# Patient Record
Sex: Female | Born: 1960 | Race: White | Hispanic: No | Marital: Married | Smoking: Never smoker
Health system: Southern US, Community
[De-identification: ages and names within clinical notes are randomized; demographics above are authoritative.]

## PROBLEM LIST (undated history)

## (undated) DIAGNOSIS — Z860100 Personal history of colon polyps, unspecified: Secondary | ICD-10-CM

## (undated) DIAGNOSIS — Z923 Personal history of irradiation: Secondary | ICD-10-CM

## (undated) DIAGNOSIS — F419 Anxiety disorder, unspecified: Secondary | ICD-10-CM

## (undated) DIAGNOSIS — E039 Hypothyroidism, unspecified: Secondary | ICD-10-CM

## (undated) DIAGNOSIS — H269 Unspecified cataract: Secondary | ICD-10-CM

## (undated) DIAGNOSIS — Z8619 Personal history of other infectious and parasitic diseases: Secondary | ICD-10-CM

## (undated) DIAGNOSIS — C50919 Malignant neoplasm of unspecified site of unspecified female breast: Secondary | ICD-10-CM

## (undated) HISTORY — DX: Personal history of other infectious and parasitic diseases: Z86.19

## (undated) HISTORY — DX: Malignant neoplasm of unspecified site of unspecified female breast: C50.919

## (undated) HISTORY — PX: BREAST BIOPSY: SHX20

## (undated) HISTORY — DX: Personal history of colonic polyps: Z86.010

## (undated) HISTORY — DX: Unspecified cataract: H26.9

## (undated) HISTORY — DX: Personal history of colon polyps, unspecified: Z86.0100

## (undated) HISTORY — DX: Hypothyroidism, unspecified: E03.9

## (undated) HISTORY — DX: Anxiety disorder, unspecified: F41.9

---

## 1996-11-25 HISTORY — PX: BREAST EXCISIONAL BIOPSY: SUR124

## 1996-11-25 HISTORY — PX: BREAST SURGERY: SHX581

## 2006-01-21 ENCOUNTER — Encounter: Admission: RE | Admit: 2006-01-21 | Discharge: 2006-01-21 | Payer: Self-pay | Admitting: Surgical Oncology

## 2007-12-16 ENCOUNTER — Encounter: Admission: RE | Admit: 2007-12-16 | Discharge: 2007-12-16 | Payer: Self-pay | Admitting: General Practice

## 2009-09-26 ENCOUNTER — Encounter: Admission: RE | Admit: 2009-09-26 | Discharge: 2009-09-26 | Payer: Self-pay | Admitting: Obstetrics and Gynecology

## 2010-05-21 LAB — HM COLONOSCOPY

## 2012-01-20 ENCOUNTER — Ambulatory Visit (INDEPENDENT_AMBULATORY_CARE_PROVIDER_SITE_OTHER): Payer: BC Managed Care – PPO | Admitting: Pulmonary Disease

## 2012-01-20 ENCOUNTER — Encounter: Payer: Self-pay | Admitting: Pulmonary Disease

## 2012-01-20 DIAGNOSIS — G479 Sleep disorder, unspecified: Secondary | ICD-10-CM | POA: Insufficient documentation

## 2012-01-20 DIAGNOSIS — R0609 Other forms of dyspnea: Secondary | ICD-10-CM

## 2012-01-20 DIAGNOSIS — R0683 Snoring: Secondary | ICD-10-CM

## 2012-01-20 DIAGNOSIS — G4700 Insomnia, unspecified: Secondary | ICD-10-CM

## 2012-01-20 DIAGNOSIS — R0989 Other specified symptoms and signs involving the circulatory and respiratory systems: Secondary | ICD-10-CM

## 2012-01-20 MED ORDER — ZOLPIDEM TARTRATE 3.5 MG SL SUBL: 3.5000 mg | SUBLINGUAL_TABLET | Freq: Every evening | SUBLINGUAL | Status: DC | PRN

## 2012-01-20 NOTE — Progress Notes (Signed)
  Subjective:    Patient ID: Jeanne Parker, female    DOB: 1961/10/30, 51 y.o.   MRN: [id number]  HPI 50/F, for evaluation of difficulty in maintaining sleep. ESS 1/24 She has been told about snoring in recent months.  Bedtime is 10 pm, sleep latency < 30 mins, sleeps on her side x 1 pillow. She has 1-2 nocturnal awakenings , including one around 3 am , then she is not able to fall asleep again & wakes up exhausted at 0645 . She was given valium a year ago & used this for a while with good results. 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  She has gained 7 lbs over the last year. She reports a lot of movements during sleep, bed is in a mess on awakening, bu denies leg cramps or symptoms s/o restless legs. She had recent blood work which showed that she Was not menopausal yet. Denies hot flashes, she has tried OTC sleep aids including melatonin without relief.    Review of Systems  Constitutional: Negative for fever and unexpected weight change.  HENT: Positive for congestion and sneezing. Negative for ear pain, nosebleeds, sore throat, rhinorrhea, trouble swallowing, dental problem, postnasal drip and sinus pressure.   Eyes: Negative for redness and itching.  Respiratory: Negative for cough, chest tightness, shortness of breath and wheezing.   Cardiovascular: Negative for palpitations and leg swelling.  Gastrointestinal: Negative for nausea and vomiting.       Acid heartburn  Genitourinary: Negative for dysuria.  Musculoskeletal: Negative for joint swelling.  Skin: Negative for rash.  Neurological: Negative for headaches.  Hematological: Does not bruise/bleed easily.  Psychiatric/Behavioral: Negative for dysphoric mood. The patient is not nervous/anxious.        Objective:   Physical Exam  Gen. Pleasant, well-nourished, in no distress, normal affect ENT - no lesions, no post nasal drip Neck: No JVD, no  thyromegaly, no carotid bruits Lungs: no use of accessory muscles, no dullness to percussion, clear without rales or rhonchi  Cardiovascular: Rhythm regular, heart sounds  normal, no murmurs or gallops, no peripheral edema Abdomen: soft and non-tender, no hepatosplenomegaly, BS normal. Musculoskeletal: No deformities, no cyanosis or clubbing Neuro:  alert, non focal      Assessment & Plan:

## 2012-01-20 NOTE — Patient Instructions (Addendum)
Trial of nasal steroid inhaler - 1 spray each nare at bedtime Home sleep study for obstructive sleep apnea  Trial of Zolpidem Tartrate

## 2012-01-21 NOTE — Assessment & Plan Note (Signed)
In absence of excessive daytime somnolence, narrow pharyngeal exam, witnessed apneas & loud snoring, obstructive sleep apnea is less likely & an overnight polysomnogram will be scheduled as a home study. The pathophysiology of obstructive sleep apnea , it's cardiovascular consequences & modes of treatment including CPAP were discused with the patient in detail & they evidenced understanding. Trial of nasal steroid inhaler  For snoring - 1 spray each nare at bedtime

## 2012-01-21 NOTE — Assessment & Plan Note (Signed)
She would benefit from an extremely short acting agent that can give her an extra 3-4 h of sleep & would not affect her adversely should she use this at 2 am As such, ambien 3.5 mg SL would be a good choice.  Alternatively, rozerem can be used but this does not appear to be on her formulary. Avoid alcohol at bedtime, decrease caffeine intake - other rules of sleep hygiene discussed

## 2012-01-23 ENCOUNTER — Telehealth: Payer: Self-pay | Admitting: Pulmonary Disease

## 2012-01-23 MED ORDER — ZOLPIDEM TARTRATE 5 MG PO TABS: 5.0000 mg | ORAL_TABLET | Freq: Every evening | ORAL | Status: DC | PRN

## 2012-01-23 NOTE — Telephone Encounter (Signed)
Spoke with pharmacist- he states that the rozerem and sonata are not covered- therefore called in rx for ambien 5 mg # 30 tablets 1 qhs prn no refills.

## 2012-01-23 NOTE — Telephone Encounter (Signed)
Pl ask them for options - Is sonata or rozerem covered? If not, OK to Rx ambien 5 mg qhs prn x 30

## 2012-01-23 NOTE — Telephone Encounter (Signed)
Spoke with Tresa Endo at [facility] .  Rx for Ambien 3.5 SL 15 tablets cost pt $131.99.  This is a new dosage for this drug and is why it is so expensive.  Pharmacy was not aware of similar med that would be covered by pt's insurance.  Spoke with pt regarding price of medication.  Pt wants to go ahead and try medication and if she decides she wants a substitute she will contact insurance to find out what is covered on her formulary.  Pt will call back with update.

## 2012-01-23 NOTE — Telephone Encounter (Signed)
[facility] [address] SENT FAX OVER STATED THAT  ZOLPIDEM TARTRATE 3.5 IS NOT COVERED PT WANT TO CHANGE TO SOMETHING CHEAPER   AND WILL NEED A NEW RX. [PLEASE ADVISE. THANK YOU .

## 2012-01-27 ENCOUNTER — Telehealth: Payer: Self-pay | Admitting: Pulmonary Disease

## 2012-01-27 ENCOUNTER — Other Ambulatory Visit: Payer: Self-pay | Admitting: *Deleted

## 2012-01-27 DIAGNOSIS — R0683 Snoring: Secondary | ICD-10-CM

## 2012-01-27 DIAGNOSIS — G4700 Insomnia, unspecified: Secondary | ICD-10-CM

## 2012-01-27 MED ORDER — MOMETASONE FUROATE 50 MCG/ACT NA SUSP: 1.0000 | Freq: Every day | NASAL | Status: DC

## 2012-01-27 NOTE — Telephone Encounter (Signed)
Yes- 1 spray each nare please

## 2012-01-27 NOTE — Telephone Encounter (Signed)
Pt is aware of directions for Nasonex use and this has been sent electronically to [facility] in [place].

## 2012-01-27 NOTE — Telephone Encounter (Signed)
Spoke to pt and she states meds are working fine at this time, she did request rx for the nasonex because she was only given a sample--pls advise if this was to continue after sample was gone

## 2012-02-25 ENCOUNTER — Ambulatory Visit (HOSPITAL_BASED_OUTPATIENT_CLINIC_OR_DEPARTMENT_OTHER): Payer: BC Managed Care – PPO | Attending: Pulmonary Disease | Admitting: Radiology

## 2012-02-25 VITALS — Ht 65.0 in | Wt 135.0 lb

## 2012-02-25 DIAGNOSIS — E039 Hypothyroidism, unspecified: Secondary | ICD-10-CM | POA: Insufficient documentation

## 2012-02-25 DIAGNOSIS — R0609 Other forms of dyspnea: Secondary | ICD-10-CM | POA: Insufficient documentation

## 2012-02-25 DIAGNOSIS — R0683 Snoring: Secondary | ICD-10-CM

## 2012-02-25 DIAGNOSIS — R259 Unspecified abnormal involuntary movements: Secondary | ICD-10-CM | POA: Insufficient documentation

## 2012-02-25 DIAGNOSIS — R5381 Other malaise: Secondary | ICD-10-CM | POA: Insufficient documentation

## 2012-02-25 DIAGNOSIS — R5383 Other fatigue: Secondary | ICD-10-CM | POA: Insufficient documentation

## 2012-02-25 DIAGNOSIS — G4700 Insomnia, unspecified: Secondary | ICD-10-CM

## 2012-02-25 DIAGNOSIS — Z79899 Other long term (current) drug therapy: Secondary | ICD-10-CM | POA: Insufficient documentation

## 2012-02-25 DIAGNOSIS — R0989 Other specified symptoms and signs involving the circulatory and respiratory systems: Secondary | ICD-10-CM | POA: Insufficient documentation

## 2012-02-25 DIAGNOSIS — G4733 Obstructive sleep apnea (adult) (pediatric): Secondary | ICD-10-CM

## 2012-02-26 ENCOUNTER — Telehealth: Payer: Self-pay | Admitting: Pulmonary Disease

## 2012-02-26 DIAGNOSIS — G4700 Insomnia, unspecified: Secondary | ICD-10-CM

## 2012-02-26 DIAGNOSIS — R0989 Other specified symptoms and signs involving the circulatory and respiratory systems: Secondary | ICD-10-CM

## 2012-02-26 DIAGNOSIS — R0609 Other forms of dyspnea: Secondary | ICD-10-CM

## 2012-02-26 NOTE — Telephone Encounter (Signed)
PSG showed snoring & mild increased resistance in upper airway. Not significant to require treatment Needs FU visit in 1-2 months

## 2012-02-27 NOTE — Telephone Encounter (Signed)
/  I spoke with patient about results and she verbalized understanding and had no questions. Pt is scheduled to come in 04/10/12 at 3:30

## 2012-02-27 NOTE — Procedures (Signed)
[name], PICKING NO.:  [id number]  MEDICAL RECORD NO.:  [id number]          PATIENT TYPE:  OUT  LOCATION:  SLEEP CENTER                 FACILITY:  [facility]  PHYSICIAN:  Oretha Milch, MD      DATE OF BIRTH:  [birth year]  DATE OF STUDY:  02/25/2012                           NOCTURNAL POLYSOMNOGRAM  REFERRING PHYSICIAN:  Oretha Milch, MD  INDICATION FOR STUDY:  Excessive daytime fatigue, loud and new onset snoring and restless and nonrefreshing sleep in this 51 year old woman with hypothyroidism.  At the time of this study, she weighed 135 pounds with a height of 5 feet 5 inches, BMI of 22 neck size of 11.5 inch.  EPWORTH SLEEPINESS SCORE:  3.  BEDTIME MEDICATIONS:  Included zolpidem at 10:30 p.m.  This nocturnal polysomnogram was performed with a sleep technologist in attendance.  EEG, EOG, EMG, EKG, and respiratory parameters were recorded.  Sleep stages, arousals, limb movements, and respiratory data were scored according to criteria laid out by the American Academy of Sleep Medicine.  SLEEP ARCHITECTURE:  Lights out was at 10:54 p.m., lights on was at 4:52 a.m.  Total sleep time was 320 minutes with a sleep period time of 338 minutes and a sleep efficiency of 89%.  Sleep latency was 90 minutes and latency to REM sleep was 245 minutes.  REM sleep was noted in one stage around 3:30 a.m.  Sleep stages of the percentage of total sleep time was N1 4%, N2 86%, N3 1%, and REM sleep 9% (29 minutes). Supine sleep accounted for 14 minutes.  Supine REM sleep was not noted.  AROUSAL DATA:  There were 103 arousals with an arousal index of 19 events per hour.  Of these, 99 were spontaneous, and 4 were associated with limb movements.  RESPIRATORY DATA:  There were 0 obstructive apneas, 0 central apneas, 0 mixed apneas and 0 hypopneas with an apnea-hypopnea index of 0 events per hour,  30 RERAs were noted with an RDI of 6 events per hour.  OXYGEN SATURATION DATA:   The desaturation index was 0.3 events per hour. She spent 0 minutes with a saturation less than 88%.  Lowest desaturation was 84%.  LIMB MOVEMENT DATA:  The limb movement index was 10.5 events per hour. The limb movement arousal index was only 0.8 events per hour.  CARDIAC DATA:  The low heart rate was 37 beats per minute.  The high heart rate was 97 beats per minute.  No arrhythmias were noted.  DISCUSSION:  She was desensitized with a small [place] full face mask but did not have enough respiratory events to warrant CPAP trial.  IMPRESSION: 1. Mild increased upper airway resistance  and soft snoring. 2. Few limb movements, however, most of these were not associated with     arousals 3. No evidence of cardiac arrhythmias, or behavioral disturbance     during sleep.  RECOMMENDATION:  No treatment is warranted for this mild degree of sleep- disordered breathing.  If snoring remains an issue, an oral appliance can be tried.  Sleep hygiene and cognitive behavioral therapy for insomnia can be attempted.     Oretha Milch, MD  RVA/MEDQ  D:  02/26/2012 11:42:45  T:  02/27/2012 02:10:09  Job:  [id number]

## 2012-03-05 ENCOUNTER — Ambulatory Visit: Payer: BC Managed Care – PPO | Admitting: Pulmonary Disease

## 2012-03-13 ENCOUNTER — Telehealth: Payer: Self-pay | Admitting: Pulmonary Disease

## 2012-03-13 MED ORDER — ZOLPIDEM TARTRATE 5 MG PO TABS: 5.0000 mg | ORAL_TABLET | Freq: Every evening | ORAL | Status: DC | PRN

## 2012-03-13 NOTE — Telephone Encounter (Signed)
ok 

## 2012-03-13 NOTE — Telephone Encounter (Signed)
Rx was refilled

## 2012-03-13 NOTE — Telephone Encounter (Signed)
[facility] [address] ZOLPIDEM TARTRATE 5MG  <> TAKE 1 TABLET AT BEDTIME AS NEEDED FOR SLEEP  #30 No Known Allergies Dr Vassie Loll is this ok to fill . Thank you

## 2012-04-10 ENCOUNTER — Ambulatory Visit: Payer: BC Managed Care – PPO | Admitting: Pulmonary Disease

## 2012-04-27 ENCOUNTER — Telehealth: Payer: Self-pay | Admitting: Pulmonary Disease

## 2012-04-27 MED ORDER — ZOLPIDEM TARTRATE 5 MG PO TABS: 5.0000 mg | ORAL_TABLET | Freq: Every evening | ORAL | Status: DC | PRN

## 2012-04-27 NOTE — Telephone Encounter (Signed)
Refill sent, pt is aware. Icelyn Navarrete, CMA  

## 2012-05-29 ENCOUNTER — Ambulatory Visit: Payer: BC Managed Care – PPO | Admitting: Pulmonary Disease

## 2012-06-11 ENCOUNTER — Telehealth: Payer: Self-pay | Admitting: Pulmonary Disease

## 2012-06-11 NOTE — Telephone Encounter (Signed)
Error

## 2012-06-12 ENCOUNTER — Encounter: Payer: Self-pay | Admitting: Pulmonary Disease

## 2012-06-12 ENCOUNTER — Ambulatory Visit (INDEPENDENT_AMBULATORY_CARE_PROVIDER_SITE_OTHER): Payer: BC Managed Care – PPO | Admitting: Pulmonary Disease

## 2012-06-12 VITALS — BP 122/80 | HR 71 | Temp 98.2°F | Ht 65.0 in | Wt 141.2 lb

## 2012-06-12 DIAGNOSIS — R0683 Snoring: Secondary | ICD-10-CM

## 2012-06-12 DIAGNOSIS — R0989 Other specified symptoms and signs involving the circulatory and respiratory systems: Secondary | ICD-10-CM

## 2012-06-12 DIAGNOSIS — R0609 Other forms of dyspnea: Secondary | ICD-10-CM

## 2012-06-12 DIAGNOSIS — Z0000 Encounter for general adult medical examination without abnormal findings: Secondary | ICD-10-CM

## 2012-06-12 DIAGNOSIS — G4700 Insomnia, unspecified: Secondary | ICD-10-CM

## 2012-06-12 MED ORDER — ZOLPIDEM TARTRATE 5 MG PO TABS: 5.0000 mg | ORAL_TABLET | Freq: Every evening | ORAL | Status: DC | PRN

## 2012-06-12 NOTE — Assessment & Plan Note (Signed)
Ct ambien Discussed CBT Int medicine referral

## 2012-06-12 NOTE — Patient Instructions (Addendum)
Ambien 5mg  x 30 pills with 5 refills INternal medicine referral

## 2012-06-12 NOTE — Progress Notes (Signed)
  Subjective:    Patient ID: Jeanne Parker, female    DOB: 12/04/1960, 51 y.o.   MRN: [id number]  HPI 50/F, for FU of insomnia ESS 1/24  She has been told about snoring in recent months.  Bedtime is 10 pm, sleep latency < 30 mins, sleeps on her side x 1 pillow. She has 1-2 nocturnal awakenings , including one around 3 am , then she is not able to fall asleep again & wakes up exhausted at 0645 . She was given valium a year ago & used this for a while with good results. 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  She has gained 7 lbs over the last year. She reports a lot of movements during sleep, bed is in a mess on awakening, bu denies leg cramps or symptoms s/o restless legs.  She had recent blood work which showed that she Was not menopausal yet. Denies hot flashes, she has tried OTC sleep aids including melatonin without relief. >> nasonex, ambien 5mg  , PSG 4/13showed snoring & mild increased resistance in upper airway.  Not significant to require treatment Ambien 5 mg helps  - she is careful not to take it late at night, no driving issues. She has changed jobs   Review of Systems Patient denies significant dyspnea,cough, hemoptysis,  chest pain, palpitations, pedal edema, orthopnea, paroxysmal nocturnal dyspnea, lightheadedness, nausea, vomiting, abdominal or  leg pains      Objective:   Physical Exam  Gen. Pleasant, well-nourished, in no distress ENT - no lesions, no post nasal drip Neck: No JVD, no thyromegaly, no carotid bruits Lungs: no use of accessory muscles, no dullness to percussion, clear without rales or rhonchi  Cardiovascular: Rhythm regular, heart sounds  normal, no murmurs or gallops, no peripheral edema Musculoskeletal: No deformities, no cyanosis or clubbing        Assessment & Plan:

## 2012-06-16 NOTE — Assessment & Plan Note (Signed)
nasonex at bedtime.

## 2012-06-17 ENCOUNTER — Ambulatory Visit: Payer: BC Managed Care – PPO | Admitting: Family Medicine

## 2013-01-04 ENCOUNTER — Ambulatory Visit (INDEPENDENT_AMBULATORY_CARE_PROVIDER_SITE_OTHER): Payer: BC Managed Care – PPO | Admitting: Internal Medicine

## 2013-01-04 ENCOUNTER — Other Ambulatory Visit (INDEPENDENT_AMBULATORY_CARE_PROVIDER_SITE_OTHER): Payer: BC Managed Care – PPO

## 2013-01-04 ENCOUNTER — Encounter: Payer: Self-pay | Admitting: Internal Medicine

## 2013-01-04 VITALS — BP 130/82 | HR 70 | Temp 98.9°F | Ht 65.0 in | Wt 140.2 lb

## 2013-01-04 DIAGNOSIS — Z0000 Encounter for general adult medical examination without abnormal findings: Secondary | ICD-10-CM

## 2013-01-04 DIAGNOSIS — E039 Hypothyroidism, unspecified: Secondary | ICD-10-CM

## 2013-01-04 DIAGNOSIS — F411 Generalized anxiety disorder: Secondary | ICD-10-CM

## 2013-01-04 DIAGNOSIS — G4700 Insomnia, unspecified: Secondary | ICD-10-CM

## 2013-01-04 LAB — URINALYSIS, ROUTINE W REFLEX MICROSCOPIC
Bilirubin Urine: NEGATIVE
Hgb urine dipstick: NEGATIVE
Ketones, ur: NEGATIVE
Leukocytes, UA: NEGATIVE
Nitrite: NEGATIVE
Specific Gravity, Urine: <=1.005 (ref 1.000–1.030)
Total Protein, Urine: NEGATIVE
Urine Glucose: NEGATIVE
Urobilinogen, UA: 0.2 (ref 0.0–1.0)
pH: 5.5 (ref 5.0–8.0)

## 2013-01-04 LAB — CBC WITH DIFFERENTIAL/PLATELET
Basophils Absolute: 0 10*3/uL (ref 0.0–0.1)
Basophils Relative: 0.4 % (ref 0.0–3.0)
Eosinophils Absolute: 0.1 10*3/uL (ref 0.0–0.7)
Eosinophils Relative: 1.9 % (ref 0.0–5.0)
HCT: 37.5 % (ref 36.0–46.0)
Hemoglobin: 12.7 g/dL (ref 12.0–15.0)
Lymphocytes Relative: 28.1 % (ref 12.0–46.0)
Lymphs Abs: 1 10*3/uL (ref 0.7–4.0)
MCHC: 33.9 g/dL (ref 30.0–36.0)
MCV: 93 fl (ref 78.0–100.0)
Monocytes Absolute: 0.3 10*3/uL (ref 0.1–1.0)
Monocytes Relative: 9 % (ref 3.0–12.0)
Neutro Abs: 2.2 10*3/uL (ref 1.4–7.7)
Neutrophils Relative %: 60.6 % (ref 43.0–77.0)
Platelets: 310 10*3/uL (ref 150.0–400.0)
RBC: 4.03 Mil/uL (ref 3.87–5.11)
RDW: 13.3 % (ref 11.5–14.6)
WBC: 3.7 10*3/uL — ABNORMAL LOW (ref 4.5–10.5)

## 2013-01-04 LAB — BASIC METABOLIC PANEL
BUN: 11 mg/dL (ref 6–23)
CO2: 26 mEq/L (ref 19–32)
Calcium: 9.1 mg/dL (ref 8.4–10.5)
Chloride: 105 mEq/L (ref 96–112)
Creatinine, Ser: 0.6 mg/dL (ref 0.4–1.2)
GFR: 109.38 mL/min (ref >60.00)
Glucose, Bld: 105 mg/dL — ABNORMAL HIGH (ref 70–99)
Potassium: 4 mEq/L (ref 3.5–5.1)
Sodium: 138 mEq/L (ref 135–145)

## 2013-01-04 LAB — LIPID PANEL
Cholesterol: 251 mg/dL — ABNORMAL HIGH (ref 0–200)
HDL: 58.3 mg/dL (ref >39.00)
Total CHOL/HDL Ratio: 4
Triglycerides: 154 mg/dL — ABNORMAL HIGH (ref 0.0–149.0)
VLDL: 30.8 mg/dL (ref 0.0–40.0)

## 2013-01-04 LAB — HEPATIC FUNCTION PANEL
ALT: 13 U/L (ref 0–35)
AST: 18 U/L (ref 0–37)
Albumin: 4.1 g/dL (ref 3.5–5.2)
Alkaline Phosphatase: 21 U/L — ABNORMAL LOW (ref 39–117)
Bilirubin, Direct: 0.1 mg/dL (ref 0.0–0.3)
Total Bilirubin: 0.7 mg/dL (ref 0.3–1.2)
Total Protein: 7.3 g/dL (ref 6.0–8.3)

## 2013-01-04 LAB — LDL CHOLESTEROL, DIRECT: Direct LDL: 158.7 mg/dL

## 2013-01-04 LAB — TSH: TSH: 1.55 u[IU]/mL (ref 0.35–5.50)

## 2013-01-04 MED ORDER — ZOLPIDEM TARTRATE 5 MG PO TABS: 5.0000 mg | ORAL_TABLET | Freq: Every evening | ORAL | Status: DC | PRN

## 2013-01-04 NOTE — Progress Notes (Signed)
Subjective:    Patient ID: Jeanne Parker, female    DOB: Jan 28, 1961, 52 y.o.   MRN: [id number]  HPI New pt to me and our division, here to establish with PCP - patient is here today for annual physical. Patient feels well overall.  Also reviewed chronic medical issues: Anxiety - largely situational - related to 3 sons with ADHD + dyslexia and subsquent behavior issues - takes citalopram qd as rx'd by gyn in 2010 - hopes to wean off same in future, but feels calm and controlled on meds at this time  Chronic insomnia - see anxiety above- take generic Ambien nightly for same - other sleep hygiene interventions ineffective - OTC therapies ineffective - no other rx med ever used - requests refills  Hypothyroid - the patient reports compliance with medication(s) as prescribed. Denies adverse side effects.  Past Medical History  Diagnosis Date  . Hypothyroid   . History of colon polyps   . History of chicken pox   . Anxiety     situational   Family History  Problem Relation Age of Onset  . Diabetes Father 64  . Hyperlipidemia Father   . Colon cancer Paternal Grandfather     died age 42  . Hypertension Father   . GER disease Father   . Hypertension Mother   . Hyperlipidemia Mother   . Coronary artery disease Father     angioplasty age 44s   History  Substance Use Topics  . Smoking status: Never Smoker   . Smokeless tobacco: Never Used  . Alcohol Use: Yes     Comment: 1 to 2 glasses of wine a week      Review of Systems Constitutional: Negative for fever or weight change. chronic fatigue without change Respiratory: Negative for cough and shortness of breath.   Cardiovascular: Negative for chest pain or palpitations.  Gastrointestinal: Negative for abdominal pain, no bowel changes.  Musculoskeletal: Negative for gait problem or joint swelling.  Skin: Negative for rash.  Neurological: Negative for dizziness or headache.  No other specific complaints in a complete  review of systems (except as listed in HPI above).     Objective:   Physical Exam BP 130/82  Pulse 70  Temp(Src) 98.9 F (37.2 C) (Oral)  Ht 5\' 5"  (1.651 m)  Wt 140 lb 3.2 oz (63.594 kg)  BMI 23.33 kg/m2  SpO2 96% Wt Readings from Last 3 Encounters:  01/04/13 140 lb 3.2 oz (63.594 kg)  06/12/12 141 lb 3.2 oz (64.048 kg)  02/25/12 135 lb (61.236 kg)   Constitutional: She appears well-developed and well-nourished. No distress.  HENT: Head: Normocephalic and atraumatic. Ears: B TMs ok, no erythema or effusion; Nose: Nose normal. Mouth/Throat: Oropharynx is clear and moist. No oropharyngeal exudate.  Eyes: Conjunctivae and EOM are normal. Pupils are equal, round, and reactive to light. No scleral icterus.  Neck: Normal range of motion. Neck supple. No JVD present. No thyromegaly present.  Cardiovascular: Normal rate, regular rhythm and normal heart sounds.  No murmur heard. No BLE edema. Pulmonary/Chest: Effort normal and breath sounds normal. No respiratory distress. She has no wheezes.  Abdominal: Soft. Bowel sounds are normal. She exhibits no distension. There is no tenderness. no masses Musculoskeletal: Normal range of motion, no joint effusions. No gross deformities Neurological: She is alert and oriented to person, place, and time. No cranial nerve deficit. Coordination normal.  Skin: Skin is warm and dry. No rash noted. No erythema.  Psychiatric: She has a normal  mood and affect. Her behavior is normal. Judgment and thought content normal.   No results found for this basename: WBC, HGB, HCT, PLT, GLUCOSE, CHOL, TRIG, HDL, LDLDIRECT, LDLCALC, ALT, AST, NA, K, CL, CREATININE, BUN, CO2, TSH, PSA, INR, GLUF, HGBA1C, MICROALBUR       Assessment & Plan:   CPX/v70.0 - Patient has been counseled on age-appropriate routine health concerns for screening and prevention. These are reviewed and up-to-date. Immunizations are up-to-date or declined. Labs ordered and will be reviewed. ROI from  GI requested re: prior colo and follow up plans

## 2013-01-04 NOTE — Assessment & Plan Note (Signed)
On citalopram daily for same since 2011 Reports symptoms largely situational related to 3 sons with ADHD and dyslexia -  Hopes to eventually wean off same Continue counseling and current meds without change - support [provided as hx reviewed in depth today The current medical regimen is effective;  continue present plan and medications.

## 2013-01-04 NOTE — Patient Instructions (Signed)
It was good to see you today. Test(s) ordered today. Your results will be released to MyChart (or called to you) after review, usually within 72hours after test completion. If any changes need to be made, you will be notified at that same time. Health Maintenance reviewed - check on your last Tetanus/Tdap with gynecology, let us know if >10 years since your last booster to update same - all other recommended immunizations and age-appropriate screenings are up-to-date. Test(s) ordered today. Your results will be released to MyChart (or called to you) after review, usually within 72hours after test completion. If any changes need to be made, you will be notified at that same time. Medications reviewed, no changes at this time. Refill on medication(s) as discussed today. Please schedule followup in 12 months for annually medical physical and labs, call sooner if problems. Health Maintenance, Females A healthy lifestyle and preventative care can promote health and wellness.  Maintain regular health, dental, and eye exams.  Eat a healthy diet. Foods like vegetables, fruits, whole grains, low-fat dairy products, and lean protein foods contain the nutrients you need without too many calories. Decrease your intake of foods high in solid fats, added sugars, and salt. Get information about a proper diet from your caregiver, if necessary.  Regular physical exercise is one of the most important things you can do for your health. Most adults should get at least 150 minutes of moderate-intensity exercise (any activity that increases your heart rate and causes you to sweat) each week. In addition, most adults need muscle-strengthening exercises on 2 or more days a week.   Maintain a healthy weight. The body mass index (BMI) is a screening tool to identify possible weight problems. It provides an estimate of body fat based on height and weight. Your caregiver can help determine your BMI, and can help you achieve or  maintain a healthy weight. For adults 20 years and older:  A BMI below 18.5 is considered underweight.  A BMI of 18.5 to 24.9 is normal.  A BMI of 25 to 29.9 is considered overweight.  A BMI of 30 and above is considered obese.  Maintain normal blood lipids and cholesterol by exercising and minimizing your intake of saturated fat. Eat a balanced diet with plenty of fruits and vegetables. Blood tests for lipids and cholesterol should begin at age 31 and be repeated every 5 years. If your lipid or cholesterol levels are high, you are over 50, or you are a high risk for heart disease, you may need your cholesterol levels checked more frequently.Ongoing high lipid and cholesterol levels should be treated with medicines if diet and exercise are not effective.  If you smoke, find out from your caregiver how to quit. If you do not use tobacco, do not start.  If you are pregnant, do not drink alcohol. If you are breastfeeding, be very cautious about drinking alcohol. If you are not pregnant and choose to drink alcohol, do not exceed 1 drink per day. One drink is considered to be 12 ounces (355 mL) of beer, 5 ounces (148 mL) of wine, or 1.5 ounces (44 mL) of liquor.  Avoid use of street drugs. Do not share needles with anyone. Ask for help if you need support or instructions about stopping the use of drugs.  High blood pressure causes heart disease and increases the risk of stroke. Blood pressure should be checked at least every 1 to 2 years. Ongoing high blood pressure should be treated with medicines, if  weight loss and exercise are not effective.  If you are 74 to 53 years old, ask your caregiver if you should take aspirin to prevent strokes.  Diabetes screening involves taking a blood sample to check your fasting blood sugar level. This should be done once every 3 years, after age 70, if you are within normal weight and without risk factors for diabetes. Testing should be considered at a younger  age or be carried out more frequently if you are overweight and have at least 1 risk factor for diabetes.  Breast cancer screening is essential preventative care for women. You should practice "breast self-awareness." This means understanding the normal appearance and feel of your breasts and may include breast self-examination. Any changes detected, no matter how small, should be reported to a caregiver. Women in their 68s and 30s should have a clinical breast exam (CBE) by a caregiver as part of a regular health exam every 1 to 3 years. After age 69, women should have a CBE every year. Starting at age 27, women should consider having a mammogram (breast X-ray) every year. Women who have a family history of breast cancer should talk to their caregiver about genetic screening. Women at a high risk of breast cancer should talk to their caregiver about having an MRI and a mammogram every year.  The Pap test is a screening test for cervical cancer. Women should have a Pap test starting at age 57. Between ages 67 and 41, Pap tests should be repeated every 2 years. Beginning at age 68, you should have a Pap test every 3 years as long as the past 3 Pap tests have been normal. If you had a hysterectomy for a problem that was not cancer or a condition that could lead to cancer, then you no longer need Pap tests. If you are between ages 50 and 53, and you have had normal Pap tests going back 10 years, you no longer need Pap tests. If you have had past treatment for cervical cancer or a condition that could lead to cancer, you need Pap tests and screening for cancer for at least 20 years after your treatment. If Pap tests have been discontinued, risk factors (such as a new sexual partner) need to be reassessed to determine if screening should be resumed. Some women have medical problems that increase the chance of getting cervical cancer. In these cases, your caregiver may recommend more frequent screening and Pap  tests.  The human papillomavirus (HPV) test is an additional test that may be used for cervical cancer screening. The HPV test looks for the virus that can cause the cell changes on the cervix. The cells collected during the Pap test can be tested for HPV. The HPV test could be used to screen women aged 30 years and older, and should be used in women of any age who have unclear Pap test results. After the age of 17, women should have HPV testing at the same frequency as a Pap test.  Colorectal cancer can be detected and often prevented. Most routine colorectal cancer screening begins at the age of 100 and continues through age 77. However, your caregiver may recommend screening at an earlier age if you have risk factors for colon cancer. On a yearly basis, your caregiver may provide home test kits to check for hidden blood in the stool. Use of a small camera at the end of a tube, to directly examine the colon (sigmoidoscopy or colonoscopy), can detect the  earliest forms of colorectal cancer. Talk to your caregiver about this at age 24, when routine screening begins. Direct examination of the colon should be repeated every 5 to 10 years through age 58, unless early forms of pre-cancerous polyps or small growths are found.  Hepatitis C blood testing is recommended for all people born from [birth year] through 1965 and any individual with known risks for hepatitis C.  Practice safe sex. Use condoms and avoid high-risk sexual practices to reduce the spread of sexually transmitted infections (STIs). Sexually active women aged 12 and younger should be checked for Chlamydia, which is a common sexually transmitted infection. Older women with new or multiple partners should also be tested for Chlamydia. Testing for other STIs is recommended if you are sexually active and at increased risk.  Osteoporosis is a disease in which the bones lose minerals and strength with aging. This can result in serious bone fractures. The risk  of osteoporosis can be identified using a bone density scan. Women ages 45 and over and women at risk for fractures or osteoporosis should discuss screening with their caregivers. Ask your caregiver whether you should be taking a calcium supplement or vitamin D to reduce the rate of osteoporosis.  Menopause can be associated with physical symptoms and risks. Hormone replacement therapy is available to decrease symptoms and risks. You should talk to your caregiver about whether hormone replacement therapy is right for you.  Use sunscreen with a sun protection factor (SPF) of 30 or greater. Apply sunscreen liberally and repeatedly throughout the day. You should seek shade when your shadow is shorter than you. Protect yourself by wearing long sleeves, pants, a wide-brimmed hat, and sunglasses year round, whenever you are outdoors.  Notify your caregiver of new moles or changes in moles, especially if there is a change in shape or color. Also notify your caregiver if a mole is larger than the size of a pencil eraser.  Stay current with your immunizations. Document Released: 05/27/2011 Document Revised: 02/03/2012 Document Reviewed: 05/27/2011 [facility] Patient Information 2013 [place], [place].

## 2013-01-04 NOTE — Assessment & Plan Note (Signed)
No results found for this basename: TSH   The current medical regimen is effective;  continue present plan and medications. 

## 2013-01-04 NOTE — Assessment & Plan Note (Signed)
Ok to continue Ambien S/p sleep eval 05/2012 for same - reviewed today

## 2013-01-09 ENCOUNTER — Other Ambulatory Visit: Payer: Self-pay | Admitting: Internal Medicine

## 2013-01-11 ENCOUNTER — Encounter: Payer: Self-pay | Admitting: Internal Medicine

## 2013-04-07 ENCOUNTER — Other Ambulatory Visit: Payer: Self-pay | Admitting: Internal Medicine

## 2013-05-21 ENCOUNTER — Other Ambulatory Visit: Payer: Self-pay | Admitting: Pulmonary Disease

## 2013-05-24 ENCOUNTER — Other Ambulatory Visit: Payer: Self-pay | Admitting: Pulmonary Disease

## 2013-05-27 ENCOUNTER — Other Ambulatory Visit: Payer: Self-pay | Admitting: Pulmonary Disease

## 2013-05-28 ENCOUNTER — Other Ambulatory Visit: Payer: Self-pay | Admitting: Pulmonary Disease

## 2013-05-31 ENCOUNTER — Other Ambulatory Visit: Payer: Self-pay | Admitting: Internal Medicine

## 2013-05-31 ENCOUNTER — Telehealth: Payer: Self-pay | Admitting: Pulmonary Disease

## 2013-05-31 NOTE — Telephone Encounter (Signed)
LM with [facility] to let them know that we have not seen in the pt in almost a year. This request needs to be sent to PCP. Nothing further was needed.

## 2013-06-20 ENCOUNTER — Other Ambulatory Visit: Payer: Self-pay | Admitting: Internal Medicine

## 2013-06-29 ENCOUNTER — Other Ambulatory Visit: Payer: Self-pay | Admitting: Internal Medicine

## 2013-07-17 ENCOUNTER — Other Ambulatory Visit: Payer: Self-pay | Admitting: Internal Medicine

## 2013-07-19 ENCOUNTER — Other Ambulatory Visit: Payer: Self-pay | Admitting: Internal Medicine

## 2013-07-19 NOTE — Telephone Encounter (Signed)
[facility].../lmb 

## 2013-07-20 NOTE — Telephone Encounter (Signed)
[facility].../lmb 

## 2013-07-29 ENCOUNTER — Other Ambulatory Visit: Payer: Self-pay | Admitting: Internal Medicine

## 2013-07-30 NOTE — Telephone Encounter (Signed)
[facility]..Marland KitchenLMB

## 2013-10-02 ENCOUNTER — Other Ambulatory Visit: Payer: Self-pay | Admitting: Internal Medicine

## 2013-10-18 ENCOUNTER — Other Ambulatory Visit: Payer: Self-pay | Admitting: Internal Medicine

## 2013-10-18 ENCOUNTER — Telehealth: Payer: Self-pay | Admitting: *Deleted

## 2013-10-18 MED ORDER — ESZOPICLONE 2 MG PO TABS: 2.0000 mg | ORAL_TABLET | Freq: Every evening | ORAL | Status: DC | PRN

## 2013-10-18 NOTE — Telephone Encounter (Signed)
Ok done

## 2013-10-18 NOTE — Telephone Encounter (Signed)
Pt called requesting sleep aide refill.  States she prefers [place].  Please advise

## 2013-10-18 NOTE — Telephone Encounter (Signed)
Spoke with pt advised Rx faxed 

## 2013-11-23 ENCOUNTER — Other Ambulatory Visit: Payer: Self-pay | Admitting: Internal Medicine

## 2013-11-23 NOTE — Telephone Encounter (Signed)
[facility].../lmb 

## 2013-12-30 ENCOUNTER — Other Ambulatory Visit: Payer: Self-pay | Admitting: Internal Medicine

## 2014-01-03 ENCOUNTER — Other Ambulatory Visit: Payer: Self-pay | Admitting: *Deleted

## 2014-01-03 MED ORDER — LEVOTHYROXINE SODIUM 75 MCG PO TABS: ORAL_TABLET | ORAL | Status: DC

## 2014-01-10 ENCOUNTER — Other Ambulatory Visit: Payer: Self-pay | Admitting: Internal Medicine

## 2014-01-12 NOTE — Telephone Encounter (Signed)
Faxed script [facility] to [facility].../lmb 

## 2014-01-14 ENCOUNTER — Other Ambulatory Visit: Payer: Self-pay | Admitting: *Deleted

## 2014-01-14 NOTE — Telephone Encounter (Signed)
Received fax pt needing PA on her Lunesta notifed insurance PA was fax over. Completed Pa and fax back waiting on approval status...Johny Chess

## 2014-01-17 NOTE — Telephone Encounter (Signed)
Received PA med has been approved notified pharmacy spoke with Travis/pharmacist gave approval status...Johny Chess

## 2014-01-28 ENCOUNTER — Other Ambulatory Visit: Payer: Self-pay | Admitting: Internal Medicine

## 2014-04-04 ENCOUNTER — Other Ambulatory Visit: Payer: Self-pay | Admitting: Internal Medicine

## 2014-04-06 ENCOUNTER — Telehealth: Payer: Self-pay | Admitting: *Deleted

## 2014-04-06 MED ORDER — CITALOPRAM HYDROBROMIDE 20 MG PO TABS: 20.0000 mg | ORAL_TABLET | Freq: Every day | ORAL | Status: DC

## 2014-04-06 MED ORDER — LEVOTHYROXINE SODIUM 75 MCG PO TABS: ORAL_TABLET | ORAL | Status: DC

## 2014-04-06 NOTE — Telephone Encounter (Signed)
Pt called and made cpx appt for 06/29/14. Have 1 synthroid left needing refill [facility] to [facility]. Izora Gala inform pt we will send enough synthroid until cpx...Johny Chess

## 2014-04-06 NOTE — Telephone Encounter (Signed)
Called pt to verify what dosage on the synthroid. Pt states she is using 75 mcg. She also need her citalopram. Inform pt will send to [facility]...Jeanne Parker

## 2014-04-25 LAB — HM MAMMOGRAPHY

## 2014-04-25 LAB — HM PAP SMEAR

## 2014-06-29 ENCOUNTER — Other Ambulatory Visit (INDEPENDENT_AMBULATORY_CARE_PROVIDER_SITE_OTHER): Payer: 59

## 2014-06-29 ENCOUNTER — Ambulatory Visit (INDEPENDENT_AMBULATORY_CARE_PROVIDER_SITE_OTHER): Payer: 59 | Admitting: Internal Medicine

## 2014-06-29 ENCOUNTER — Encounter: Payer: Self-pay | Admitting: Internal Medicine

## 2014-06-29 VITALS — BP 122/70 | HR 66 | Temp 98.2°F | Ht 65.0 in | Wt 134.1 lb

## 2014-06-29 DIAGNOSIS — Z0000 Encounter for general adult medical examination without abnormal findings: Secondary | ICD-10-CM

## 2014-06-29 DIAGNOSIS — Z23 Encounter for immunization: Secondary | ICD-10-CM

## 2014-06-29 DIAGNOSIS — E039 Hypothyroidism, unspecified: Secondary | ICD-10-CM

## 2014-06-29 DIAGNOSIS — G4700 Insomnia, unspecified: Secondary | ICD-10-CM

## 2014-06-29 LAB — HEPATIC FUNCTION PANEL
ALT: 12 U/L (ref 0–35)
AST: 15 U/L (ref 0–37)
Albumin: 4 g/dL (ref 3.5–5.2)
Alkaline Phosphatase: 21 U/L — ABNORMAL LOW (ref 39–117)
Bilirubin, Direct: 0.1 mg/dL (ref 0.0–0.3)
Total Bilirubin: 0.4 mg/dL (ref 0.2–1.2)
Total Protein: 6.9 g/dL (ref 6.0–8.3)

## 2014-06-29 LAB — BASIC METABOLIC PANEL
BUN: 11 mg/dL (ref 6–23)
CO2: 27 mEq/L (ref 19–32)
Calcium: 9 mg/dL (ref 8.4–10.5)
Chloride: 103 mEq/L (ref 96–112)
Creatinine, Ser: 0.7 mg/dL (ref 0.4–1.2)
GFR: 94.35 mL/min (ref >60.00)
Glucose, Bld: 90 mg/dL (ref 70–99)
Potassium: 4 mEq/L (ref 3.5–5.1)
Sodium: 135 mEq/L (ref 135–145)

## 2014-06-29 LAB — URINALYSIS, ROUTINE W REFLEX MICROSCOPIC
Bilirubin Urine: NEGATIVE
Ketones, ur: NEGATIVE
Nitrite: NEGATIVE
Specific Gravity, Urine: 1.01 (ref 1.000–1.030)
Total Protein, Urine: NEGATIVE
Urine Glucose: NEGATIVE
Urobilinogen, UA: 0.2 (ref 0.0–1.0)
pH: 7 (ref 5.0–8.0)

## 2014-06-29 LAB — CBC WITH DIFFERENTIAL/PLATELET
Basophils Absolute: 0 10*3/uL (ref 0.0–0.1)
Basophils Relative: 0.4 % (ref 0.0–3.0)
Eosinophils Absolute: 0.2 10*3/uL (ref 0.0–0.7)
Eosinophils Relative: 3.3 % (ref 0.0–5.0)
HCT: 37.3 % (ref 36.0–46.0)
Hemoglobin: 12.4 g/dL (ref 12.0–15.0)
Lymphocytes Relative: 28.1 % (ref 12.0–46.0)
Lymphs Abs: 1.4 10*3/uL (ref 0.7–4.0)
MCHC: 33.2 g/dL (ref 30.0–36.0)
MCV: 93.5 fl (ref 78.0–100.0)
Monocytes Absolute: 0.3 10*3/uL (ref 0.1–1.0)
Monocytes Relative: 6.9 % (ref 3.0–12.0)
Neutro Abs: 3.1 10*3/uL (ref 1.4–7.7)
Neutrophils Relative %: 61.3 % (ref 43.0–77.0)
Platelets: 314 10*3/uL (ref 150.0–400.0)
RBC: 3.99 Mil/uL (ref 3.87–5.11)
RDW: 13.1 % (ref 11.5–15.5)
WBC: 5 10*3/uL (ref 4.0–10.5)

## 2014-06-29 LAB — LIPID PANEL
Cholesterol: 220 mg/dL — ABNORMAL HIGH (ref 0–200)
HDL: 54.2 mg/dL (ref >39.00)
LDL Cholesterol: 134 mg/dL — ABNORMAL HIGH (ref 0–99)
NonHDL: 165.8
Total CHOL/HDL Ratio: 4
Triglycerides: 157 mg/dL — ABNORMAL HIGH (ref 0.0–149.0)
VLDL: 31.4 mg/dL (ref 0.0–40.0)

## 2014-06-29 LAB — TSH: TSH: 0.88 u[IU]/mL (ref 0.35–4.50)

## 2014-06-29 MED ORDER — CITALOPRAM HYDROBROMIDE 20 MG PO TABS: 20.0000 mg | ORAL_TABLET | Freq: Every day | ORAL | Status: DC

## 2014-06-29 MED ORDER — ESZOPICLONE 2 MG PO TABS
2.0000 mg | ORAL_TABLET | Freq: Every evening | ORAL | Status: DC | PRN
Start: 2014-06-29 — End: 2014-09-19

## 2014-06-29 MED ORDER — LEVOTHYROXINE SODIUM 75 MCG PO TABS: 75.0000 ug | ORAL_TABLET | Freq: Every day | ORAL | Status: DC

## 2014-06-29 NOTE — Patient Instructions (Addendum)
It was good to see you today.  We have reviewed your prior records including labs and tests today  Health Maintenance reviewed - Tdap ( tetanus diphtheria and pertussis) immunization updated today, June every 10 years. All other recommended immunizations and age-appropriate screenings are up-to-date.  Test(s) ordered today. Your results will be released to Jeanne Parker (or called to you) after review, usually within 72hours after test completion. If any changes need to be made, you will be notified at that same time.  Medications reviewed and updated, no changes recommended at this time. Refill on medication(s) as discussed today.  Please schedule followup in 12 months for annual exam and labs, call sooner if problems.  Health Maintenance Adopting a healthy lifestyle and getting preventive care can go a long way to promote health and wellness. Talk with your health care provider about what schedule of regular examinations is right for you. This is a good chance for you to check in with your provider about disease prevention and staying healthy. In between checkups, there are plenty of things you can do on your own. Experts have done a lot of research about which lifestyle changes and preventive measures are most likely to keep you healthy. Ask your health care provider for more information. WEIGHT AND DIET  Eat a healthy diet  Be sure to include plenty of vegetables, fruits, low-fat dairy products, and lean protein.  Do not eat a lot of foods high in solid fats, added sugars, or salt.  Get regular exercise. This is one of the most important things you can do for your health.  Most adults should exercise for at least 150 minutes each week. The exercise should increase your heart rate and make you sweat (moderate-intensity exercise).  Most adults should also do strengthening exercises at least twice a week. This is in addition to the moderate-intensity exercise.  Maintain a healthy  weight  Body mass index (BMI) is a measurement that can be used to identify possible weight problems. It estimates body fat based on height and weight. Your health care provider can help determine your BMI and help you achieve or maintain a healthy weight.  For females 40 years of age and older:   A BMI below 18.5 is considered underweight.  A BMI of 18.5 to 24.9 is normal.  A BMI of 25 to 29.9 is considered overweight.  A BMI of 30 and above is considered obese.  Watch levels of cholesterol and blood lipids  You should start having your blood tested for lipids and cholesterol at 54 years of age, then have this test every 5 years.  You may need to have your cholesterol levels checked more often if:  Your lipid or cholesterol levels are high.  You are older than 54 years of age.  You are at high risk for heart disease.  CANCER SCREENING   Lung Cancer  Lung cancer screening is recommended for adults 63-19 years old who are at high risk for lung cancer because of a history of smoking.  A yearly low-dose CT scan of the lungs is recommended for people who:  Currently smoke.  Have quit within the past 15 years.  Have at least a 30-pack-year history of smoking. A pack year is smoking an average of one pack of cigarettes a day for 1 year.  Yearly screening should continue until it has been 15 years since you quit.  Yearly screening should stop if you develop a health problem that would prevent you from  having lung cancer treatment.  Breast Cancer  Practice breast self-awareness. This means understanding how your breasts normally appear and feel.  It also means doing regular breast self-exams. Let your health care provider know about any changes, no matter how small.  If you are in your 20s or 30s, you should have a clinical breast exam (CBE) by a health care provider every 1-3 years as part of a regular health exam.  If you are 95 or older, have a CBE every year. Also  consider having a breast X-ray (mammogram) every year.  If you have a family history of breast cancer, talk to your health care provider about genetic screening.  If you are at high risk for breast cancer, talk to your health care provider about having an MRI and a mammogram every year.  Breast cancer gene (BRCA) assessment is recommended for women who have family members with BRCA-related cancers. BRCA-related cancers include:  Breast.  Ovarian.  Tubal.  Peritoneal cancers.  Results of the assessment will determine the need for genetic counseling and BRCA1 and BRCA2 testing. Cervical Cancer Routine pelvic examinations to screen for cervical cancer are no longer recommended for nonpregnant women who are considered low risk for cancer of the pelvic organs (ovaries, uterus, and vagina) and who do not have symptoms. A pelvic examination may be necessary if you have symptoms including those associated with pelvic infections. Ask your health care provider if a screening pelvic exam is right for you.   The Pap test is the screening test for cervical cancer for women who are considered at risk.  If you had a hysterectomy for a problem that was not cancer or a condition that could lead to cancer, then you no longer need Pap tests.  If you are older than 65 years, and you have had normal Pap tests for the past 10 years, you no longer need to have Pap tests.  If you have had past treatment for cervical cancer or a condition that could lead to cancer, you need Pap tests and screening for cancer for at least 20 years after your treatment.  If you no longer get a Pap test, assess your risk factors if they change (such as having a new sexual partner). This can affect whether you should start being screened again.  Some women have medical problems that increase their chance of getting cervical cancer. If this is the case for you, your health care provider may recommend more frequent screening and Pap  tests.  The human papillomavirus (HPV) test is another test that may be used for cervical cancer screening. The HPV test looks for the virus that can cause cell changes in the cervix. The cells collected during the Pap test can be tested for HPV.  The HPV test can be used to screen women 61 years of age and older. Getting tested for HPV can extend the interval between normal Pap tests from three to five years.  An HPV test also should be used to screen women of any age who have unclear Pap test results.  After 54 years of age, women should have HPV testing as often as Pap tests.  Colorectal Cancer  This type of cancer can be detected and often prevented.  Routine colorectal cancer screening usually begins at 54 years of age and continues through 54 years of age.  Your health care provider may recommend screening at an earlier age if you have risk factors for colon cancer.  Your health care  provider may also recommend using home test kits to check for hidden blood in the stool.  A small camera at the end of a tube can be used to examine your colon directly (sigmoidoscopy or colonoscopy). This is done to check for the earliest forms of colorectal cancer.  Routine screening usually begins at age 26.  Direct examination of the colon should be repeated every 5-10 years through 54 years of age. However, you may need to be screened more often if early forms of precancerous polyps or small growths are found. Skin Cancer  Check your skin from head to toe regularly.  Tell your health care provider about any new moles or changes in moles, especially if there is a change in a mole's shape or color.  Also tell your health care provider if you have a mole that is larger than the size of a pencil eraser.  Always use sunscreen. Apply sunscreen liberally and repeatedly throughout the day.  Protect yourself by wearing long sleeves, pants, a wide-brimmed hat, and sunglasses whenever you are  outside. HEART DISEASE, DIABETES, AND HIGH BLOOD PRESSURE   Have your blood pressure checked at least every 1-2 years. High blood pressure causes heart disease and increases the risk of stroke.  If you are between 30 years and 10 years old, ask your health care provider if you should take aspirin to prevent strokes.  Have regular diabetes screenings. This involves taking a blood sample to check your fasting blood sugar level.  If you are at a normal weight and have a low risk for diabetes, have this test once every three years after 54 years of age.  If you are overweight and have a high risk for diabetes, consider being tested at a younger age or more often. PREVENTING INFECTION  Hepatitis B  If you have a higher risk for hepatitis B, you should be screened for this virus. You are considered at high risk for hepatitis B if:  You were born in a country where hepatitis B is common. Ask your health care provider which countries are considered high risk.  Your parents were born in a high-risk country, and you have not been immunized against hepatitis B (hepatitis B vaccine).  You have HIV or AIDS.  You use needles to inject street drugs.  You live with someone who has hepatitis B.  You have had sex with someone who has hepatitis B.  You get hemodialysis treatment.  You take certain medicines for conditions, including cancer, organ transplantation, and autoimmune conditions. Hepatitis C  Blood testing is recommended for:  Everyone born from [birth year] through 1966.  Anyone with known risk factors for hepatitis C. Sexually transmitted infections (STIs)  You should be screened for sexually transmitted infections (STIs) including gonorrhea and chlamydia if:  You are sexually active and are younger than 54 years of age.  You are older than 54 years of age and your health care provider tells you that you are at risk for this type of infection.  Your sexual activity has changed since  you were last screened and you are at an increased risk for chlamydia or gonorrhea. Ask your health care provider if you are at risk.  If you do not have HIV, but are at risk, it may be recommended that you take a prescription medicine daily to prevent HIV infection. This is called pre-exposure prophylaxis (PrEP). You are considered at risk if:  You are sexually active and do not regularly use condoms or know  the HIV status of your partner(s).  You take drugs by injection.  You are sexually active with a partner who has HIV. Talk with your health care provider about whether you are at high risk of being infected with HIV. If you choose to begin PrEP, you should first be tested for HIV. You should then be tested every 3 months for as long as you are taking PrEP.  PREGNANCY   If you are premenopausal and you may become pregnant, ask your health care provider about preconception counseling.  If you may become pregnant, take 400 to 800 micrograms (mcg) of folic acid every day.  If you want to prevent pregnancy, talk to your health care provider about birth control (contraception). OSTEOPOROSIS AND MENOPAUSE   Osteoporosis is a disease in which the bones lose minerals and strength with aging. This can result in serious bone fractures. Your risk for osteoporosis can be identified using a bone density scan.  If you are 77 years of age or older, or if you are at risk for osteoporosis and fractures, ask your health care provider if you should be screened.  Ask your health care provider whether you should take a calcium or vitamin D supplement to lower your risk for osteoporosis.  Menopause may have certain physical symptoms and risks.  Hormone replacement therapy may reduce some of these symptoms and risks. Talk to your health care provider about whether hormone replacement therapy is right for you.  HOME CARE INSTRUCTIONS   Schedule regular health, dental, and eye exams.  Stay current with  your immunizations.   Do not use any tobacco products including cigarettes, chewing tobacco, or electronic cigarettes.  If you are pregnant, do not drink alcohol.  If you are breastfeeding, limit how much and how often you drink alcohol.  Limit alcohol intake to no more than 1 drink per day for nonpregnant women. One drink equals 12 ounces of beer, 5 ounces of wine, or 1 ounces of hard liquor.  Do not use street drugs.  Do not share needles.  Ask your health care provider for help if you need support or information about quitting drugs.  Tell your health care provider if you often feel depressed.  Tell your health care provider if you have ever been abused or do not feel safe at home. Document Released: 05/27/2011 Document Revised: 03/28/2014 Document Reviewed: 10/13/2013 [facility] Patient Information 2015 [place], [place]. This information is not intended to replace advice given to you by your health care provider. Make sure you discuss any questions you have with your health care provider.

## 2014-06-29 NOTE — Progress Notes (Signed)
Pre visit review using our clinic review tool, if applicable. No additional management support is needed unless otherwise documented below in the visit note. 

## 2014-06-29 NOTE — Assessment & Plan Note (Signed)
Ok to continue sleep aide as needed - prefers Jeanne Parker to Ambien  Improved symptoms since job change and settling legal conflicts S/p sleep eval 05/2012 for same - reviewed today - refill provided

## 2014-06-29 NOTE — Progress Notes (Signed)
Subjective:    Patient ID: Jeanne Parker, female    DOB: 1961-09-03, 53 y.o.   MRN: [id number]  HPI  patient is here today for annual physical. Patient feels well and has no complaints.  Also reviewed chronic medical issues and interval medical events  Past Medical History  Diagnosis Date  . Hypothyroid   . History of colon polyps   . History of chicken pox   . Anxiety     situational   Family History  Problem Relation Age of Onset  . Diabetes Father 29  . Hyperlipidemia Father   . Colon cancer Paternal Grandfather     died age 53  . Hypertension Father   . GER disease Father   . Hypertension Mother 63  . Hyperlipidemia Mother   . Coronary artery disease Father     angioplasty age 86s  . Atrial fibrillation Father     on coumadin   History  Substance Use Topics  . Smoking status: Never Smoker   . Smokeless tobacco: Never Used  . Alcohol Use: Yes     Comment: 1 to 2 glasses of wine a week     Review of Systems  Constitutional: Negative for fatigue and unexpected weight change.  Respiratory: Negative for cough, shortness of breath and wheezing.   Cardiovascular: Negative for chest pain, palpitations and leg swelling.  Gastrointestinal: Negative for nausea, abdominal pain and diarrhea.  Neurological: Negative for dizziness, weakness, light-headedness and headaches.  Psychiatric/Behavioral: Negative for dysphoric mood. The patient is not nervous/anxious.   All other systems reviewed and are negative.      Objective:   Physical Exam  BP 122/70  Pulse 66  Temp(Src) 98.2 F (36.8 C) (Oral)  Ht 5\' 5"  (1.651 m)  Wt 134 lb 2 oz (60.839 kg)  BMI 22.32 kg/m2  SpO2 97% Wt Readings from Last 3 Encounters:  06/29/14 134 lb 2 oz (60.839 kg)  01/04/13 140 lb 3.2 oz (63.594 kg)  06/12/12 141 lb 3.2 oz (64.048 kg)   Constitutional: She appears well-developed and well-nourished. No distress.  HENT: Head: Normocephalic and atraumatic. Ears: B TMs ok, no  erythema or effusion; Nose: Nose normal. Mouth/Throat: Oropharynx is clear and moist. No oropharyngeal exudate.  Eyes: Conjunctivae and EOM are normal. Pupils are equal, round, and reactive to light. No scleral icterus.  Neck: Normal range of motion. Neck supple. No JVD present. No thyromegaly present.  Cardiovascular: Normal rate, regular rhythm and normal heart sounds.  No murmur heard. No BLE edema. Pulmonary/Chest: Effort normal and breath sounds normal. No respiratory distress. She has no wheezes.  Abdominal: Soft. Bowel sounds are normal. She exhibits no distension. There is no tenderness. no masses GU/breast: defer to gyn Musculoskeletal: Normal range of motion, no joint effusions. No gross deformities Neurological: She is alert and oriented to person, place, and time. No cranial nerve deficit. Coordination, balance, strength, speech and gait are normal.  Skin: Skin is warm and dry. No rash noted. No erythema.  Psychiatric: She has a normal mood and affect. Her behavior is normal. Judgment and thought content normal.    Lab Results  Component Value Date   WBC 3.7* 01/04/2013   HGB 12.7 01/04/2013   HCT 37.5 01/04/2013   PLT 310.0 01/04/2013   GLUCOSE 105* 01/04/2013   CHOL 251* 01/04/2013   TRIG 154.0* 01/04/2013   HDL 58.30 01/04/2013   LDLDIRECT 158.7 01/04/2013   ALT 13 01/04/2013   AST 18 01/04/2013   NA 138  01/04/2013   K 4.0 01/04/2013   CL 105 01/04/2013   CREATININE 0.6 01/04/2013   BUN 11 01/04/2013   CO2 26 01/04/2013   TSH 1.55 01/04/2013    Mm Digital Screening  09/27/2009   DG SCREEN MAMMOGRAM BILATERAL Bilateral CC and MLO view(s) were taken. Prior study comparison: January 21, 2006, bilateral screening mammogram.   DIGITAL SCREENING MAMMOGRAM WITH CAD:   Comparison:  Prior studies.   The breast tissue is extremely dense.  There is no dominant mass, architectural distortion or  calcification to suggest malignancy.   Images were processed with CAD.   IMPRESSION:   No  mammographic evidence of malignancy.  Suggest yearly screening mammography.   A result letter of this screening mammogram will be mailed directly to the patient.     ASSESSMENT: Negative - BI-RADS 1   Screening mammogram in 1 year. ,  Provider: [place]:   CPX/v70.0 - Patient has been counseled on age-appropriate routine health concerns for screening and prevention. These are reviewed and up-to-date. Immunizations are up-to-date or declined. Labs ordered and reviewed.  Problem List Items Addressed This Visit   Insomnia     Ok to continue sleep aide as needed - prefers Lunesta to Ambien  Improved symptoms since job change and settling legal conflicts S/p sleep eval 05/2012 for same - reviewed today - refill provided    Unspecified hypothyroidism      Lab Results  Component Value Date   TSH 1.55 01/04/2013   The current medical regimen is effective;  continue present plan and medications.     Relevant Medications      levothyroxine (SYNTHROID, LEVOTHROID) tablet    Other Visit Diagnoses   Routine general medical examination at a health care facility    -  Primary    Relevant Orders       Basic metabolic panel       CBC with Differential       Hepatic function panel       Lipid panel       TSH       Urinalysis, Routine w reflex microscopic    Need for prophylactic vaccination with combined diphtheria-tetanus-pertussis (DTP) vaccine        Relevant Orders       Tdap vaccine greater than or equal to 7yo IM

## 2014-06-29 NOTE — Assessment & Plan Note (Signed)
Lab Results  Component Value Date   TSH 1.55 01/04/2013   The current medical regimen is effective;  continue present plan and medications.

## 2014-08-19 ENCOUNTER — Ambulatory Visit: Payer: 59 | Admitting: Licensed Clinical Social Worker

## 2014-08-24 ENCOUNTER — Ambulatory Visit (INDEPENDENT_AMBULATORY_CARE_PROVIDER_SITE_OTHER): Payer: 59 | Admitting: Psychology

## 2014-08-24 DIAGNOSIS — F4322 Adjustment disorder with anxiety: Secondary | ICD-10-CM

## 2014-09-16 ENCOUNTER — Ambulatory Visit (INDEPENDENT_AMBULATORY_CARE_PROVIDER_SITE_OTHER): Payer: 59 | Admitting: Psychology

## 2014-09-16 DIAGNOSIS — F4322 Adjustment disorder with anxiety: Secondary | ICD-10-CM

## 2014-09-19 ENCOUNTER — Other Ambulatory Visit: Payer: Self-pay

## 2014-09-19 MED ORDER — ESZOPICLONE 2 MG PO TABS: 2.0000 mg | ORAL_TABLET | Freq: Every evening | ORAL | Status: DC | PRN

## 2014-09-22 ENCOUNTER — Telehealth: Payer: Self-pay

## 2014-09-22 MED ORDER — ESZOPICLONE 2 MG PO TABS: 2.0000 mg | ORAL_TABLET | Freq: Every evening | ORAL | Status: DC | PRN

## 2014-09-22 NOTE — Telephone Encounter (Signed)
done

## 2014-09-22 NOTE — Telephone Encounter (Signed)
Lunesta refill request  [facility]  [phone]

## 2014-09-26 MED ORDER — ESZOPICLONE 2 MG PO TABS
2.0000 mg | ORAL_TABLET | Freq: Every evening | ORAL | Status: DC | PRN
Start: 2014-09-26 — End: 2015-03-09

## 2014-11-25 HISTORY — PX: BREAST LUMPECTOMY: SHX2

## 2014-12-06 ENCOUNTER — Ambulatory Visit (INDEPENDENT_AMBULATORY_CARE_PROVIDER_SITE_OTHER): Payer: 59 | Admitting: Psychology

## 2014-12-06 DIAGNOSIS — F4322 Adjustment disorder with anxiety: Secondary | ICD-10-CM

## 2015-03-08 ENCOUNTER — Telehealth: Payer: Self-pay

## 2015-03-08 NOTE — Telephone Encounter (Signed)
Received refill request from [facility] requesting refills for Lunesta. Rx last written 09/26/14 #90/0rf and pt last seen 06/29/14 . Please advise Thanks

## 2015-03-08 NOTE — Telephone Encounter (Signed)
Ok to generate rx for me to sign tomorrow - thanks!

## 2015-03-09 MED ORDER — ESZOPICLONE 2 MG PO TABS
2.0000 mg | ORAL_TABLET | Freq: Every evening | ORAL | Status: DC | PRN
Start: 2015-03-09 — End: 2015-11-07

## 2015-03-09 NOTE — Telephone Encounter (Signed)
Done

## 2015-03-09 NOTE — Addendum Note (Signed)
Addended by: Estell Harpin T on: 03/09/2015 08:37 AM   Modules accepted: Orders

## 2015-04-26 ENCOUNTER — Other Ambulatory Visit: Payer: Self-pay | Admitting: Internal Medicine

## 2015-05-01 LAB — HM COLONOSCOPY

## 2015-05-03 ENCOUNTER — Telehealth: Payer: Self-pay

## 2015-05-16 NOTE — Telephone Encounter (Signed)
error 

## 2015-06-12 ENCOUNTER — Ambulatory Visit (INDEPENDENT_AMBULATORY_CARE_PROVIDER_SITE_OTHER): Payer: 59 | Admitting: Psychology

## 2015-06-12 DIAGNOSIS — F4322 Adjustment disorder with anxiety: Secondary | ICD-10-CM | POA: Diagnosis not present

## 2015-06-20 ENCOUNTER — Ambulatory Visit (INDEPENDENT_AMBULATORY_CARE_PROVIDER_SITE_OTHER): Payer: 59 | Admitting: Psychology

## 2015-06-20 DIAGNOSIS — F4322 Adjustment disorder with anxiety: Secondary | ICD-10-CM

## 2015-07-03 ENCOUNTER — Encounter: Payer: 59 | Admitting: Internal Medicine

## 2015-07-26 ENCOUNTER — Ambulatory Visit (INDEPENDENT_AMBULATORY_CARE_PROVIDER_SITE_OTHER): Payer: 59 | Admitting: Psychology

## 2015-07-26 DIAGNOSIS — F4322 Adjustment disorder with anxiety: Secondary | ICD-10-CM

## 2015-08-04 ENCOUNTER — Ambulatory Visit (INDEPENDENT_AMBULATORY_CARE_PROVIDER_SITE_OTHER): Payer: 59 | Admitting: Psychology

## 2015-08-04 DIAGNOSIS — F4322 Adjustment disorder with anxiety: Secondary | ICD-10-CM | POA: Diagnosis not present

## 2015-08-16 ENCOUNTER — Ambulatory Visit (INDEPENDENT_AMBULATORY_CARE_PROVIDER_SITE_OTHER): Payer: 59 | Admitting: Psychology

## 2015-08-16 DIAGNOSIS — F4322 Adjustment disorder with anxiety: Secondary | ICD-10-CM

## 2015-09-05 ENCOUNTER — Encounter: Payer: Self-pay | Admitting: Internal Medicine

## 2015-09-22 ENCOUNTER — Ambulatory Visit (INDEPENDENT_AMBULATORY_CARE_PROVIDER_SITE_OTHER): Payer: 59 | Admitting: Psychology

## 2015-09-22 DIAGNOSIS — F4322 Adjustment disorder with anxiety: Secondary | ICD-10-CM

## 2015-10-04 ENCOUNTER — Other Ambulatory Visit: Payer: Self-pay | Admitting: Obstetrics and Gynecology

## 2015-10-04 DIAGNOSIS — R928 Other abnormal and inconclusive findings on diagnostic imaging of breast: Secondary | ICD-10-CM

## 2015-10-06 ENCOUNTER — Ambulatory Visit: Payer: 59 | Admitting: Psychology

## 2015-10-10 ENCOUNTER — Other Ambulatory Visit: Payer: Self-pay | Admitting: Obstetrics and Gynecology

## 2015-10-10 ENCOUNTER — Ambulatory Visit
Admission: RE | Admit: 2015-10-10 | Discharge: 2015-10-10 | Disposition: A | Discharge status: Home / Self Care | Payer: 59 | Source: Ambulatory Visit | Attending: Obstetrics and Gynecology | Admitting: Obstetrics and Gynecology

## 2015-10-10 DIAGNOSIS — R928 Other abnormal and inconclusive findings on diagnostic imaging of breast: Secondary | ICD-10-CM

## 2015-10-11 ENCOUNTER — Ambulatory Visit
Admission: RE | Admit: 2015-10-11 | Discharge: 2015-10-11 | Disposition: A | Discharge status: Home / Self Care | Payer: 59 | Source: Ambulatory Visit | Attending: Obstetrics and Gynecology | Admitting: Obstetrics and Gynecology

## 2015-10-11 ENCOUNTER — Other Ambulatory Visit: Payer: Self-pay | Admitting: Obstetrics and Gynecology

## 2015-10-11 DIAGNOSIS — R928 Other abnormal and inconclusive findings on diagnostic imaging of breast: Secondary | ICD-10-CM

## 2015-10-13 ENCOUNTER — Ambulatory Visit
Admission: RE | Admit: 2015-10-13 | Discharge: 2015-10-13 | Disposition: A | Discharge status: Home / Self Care | Payer: 59 | Source: Ambulatory Visit | Attending: Obstetrics and Gynecology | Admitting: Obstetrics and Gynecology

## 2015-10-13 DIAGNOSIS — R928 Other abnormal and inconclusive findings on diagnostic imaging of breast: Secondary | ICD-10-CM

## 2015-10-16 ENCOUNTER — Telehealth: Payer: Self-pay | Admitting: *Deleted

## 2015-10-16 DIAGNOSIS — C50211 Malignant neoplasm of upper-inner quadrant of right female breast: Secondary | ICD-10-CM | POA: Insufficient documentation

## 2015-10-16 NOTE — Telephone Encounter (Signed)
Received call back from patient.  Confirmed [facility] for 10/25/15 at 1230pm.  Instructions and contact information given.

## 2015-10-16 NOTE — Telephone Encounter (Signed)
Left message for a return phone to schedule for [facility] 11/30.

## 2015-10-25 ENCOUNTER — Encounter: Payer: Self-pay | Admitting: Hematology and Oncology

## 2015-10-25 ENCOUNTER — Ambulatory Visit: Payer: Self-pay | Admitting: Surgery

## 2015-10-25 ENCOUNTER — Other Ambulatory Visit: Payer: Self-pay

## 2015-10-25 ENCOUNTER — Ambulatory Visit
Admission: RE | Admit: 2015-10-25 | Discharge: 2015-10-25 | Disposition: A | Discharge status: Home / Self Care | Payer: 59 | Source: Ambulatory Visit | Attending: Radiation Oncology | Admitting: Radiation Oncology

## 2015-10-25 ENCOUNTER — Encounter: Payer: Self-pay | Admitting: Physical Therapy

## 2015-10-25 ENCOUNTER — Other Ambulatory Visit (HOSPITAL_BASED_OUTPATIENT_CLINIC_OR_DEPARTMENT_OTHER): Payer: 59

## 2015-10-25 ENCOUNTER — Ambulatory Visit: Payer: 59 | Attending: Surgery | Admitting: Physical Therapy

## 2015-10-25 ENCOUNTER — Ambulatory Visit (HOSPITAL_BASED_OUTPATIENT_CLINIC_OR_DEPARTMENT_OTHER): Payer: 59 | Admitting: Hematology and Oncology

## 2015-10-25 VITALS — BP 131/77 | HR 79 | Temp 97.6°F | Resp 20 | Ht 65.0 in | Wt 140.0 lb

## 2015-10-25 DIAGNOSIS — Z23 Encounter for immunization: Secondary | ICD-10-CM

## 2015-10-25 DIAGNOSIS — C50211 Malignant neoplasm of upper-inner quadrant of right female breast: Secondary | ICD-10-CM | POA: Insufficient documentation

## 2015-10-25 DIAGNOSIS — C50911 Malignant neoplasm of unspecified site of right female breast: Secondary | ICD-10-CM

## 2015-10-25 LAB — CBC WITH DIFFERENTIAL/PLATELET
BASO%: 0.2 % (ref 0.0–2.0)
Basophils Absolute: 0 10*3/uL (ref 0.0–0.1)
EOS%: 5 % (ref 0.0–7.0)
Eosinophils Absolute: 0.2 10*3/uL (ref 0.0–0.5)
HCT: 39.5 % (ref 34.8–46.6)
HGB: 13 g/dL (ref 11.6–15.9)
LYMPH%: 32.8 % (ref 14.0–49.7)
MCH: 31.3 pg (ref 25.1–34.0)
MCHC: 32.9 g/dL (ref 31.5–36.0)
MCV: 95 fL (ref 79.5–101.0)
MONO#: 0.3 10*3/uL (ref 0.1–0.9)
MONO%: 6.6 % (ref 0.0–14.0)
NEUT#: 2.5 10*3/uL (ref 1.5–6.5)
NEUT%: 55.4 % (ref 38.4–76.8)
Platelets: 282 10*3/uL (ref 145–400)
RBC: 4.16 10*6/uL (ref 3.70–5.45)
RDW: 13.5 % (ref 11.2–14.5)
WBC: 4.6 10*3/uL (ref 3.9–10.3)
lymph#: 1.5 10*3/uL (ref 0.9–3.3)

## 2015-10-25 LAB — COMPREHENSIVE METABOLIC PANEL (CC13)
ALT: 21 U/L (ref 0–55)
AST: 22 U/L (ref 5–34)
Albumin: 4.4 g/dL (ref 3.5–5.0)
Alkaline Phosphatase: 36 U/L — ABNORMAL LOW (ref 40–150)
Anion Gap: 8 mEq/L (ref 3–11)
BUN: 13 mg/dL (ref 7.0–26.0)
CO2: 27 mEq/L (ref 22–29)
Calcium: 9.7 mg/dL (ref 8.4–10.4)
Chloride: 106 mEq/L (ref 98–109)
Creatinine: 0.9 mg/dL (ref 0.6–1.1)
EGFR: 71 mL/min/{1.73_m2} — ABNORMAL LOW (ref >90)
Glucose: 134 mg/dl (ref 70–140)
Potassium: 4 mEq/L (ref 3.5–5.1)
Sodium: 141 mEq/L (ref 136–145)
Total Bilirubin: 0.34 mg/dL (ref 0.20–1.20)
Total Protein: 7.8 g/dL (ref 6.4–8.3)

## 2015-10-25 MED ORDER — INFLUENZA VAC SPLIT QUAD 0.5 ML IM SUSY
0.5000 mL | PREFILLED_SYRINGE | Freq: Once | INTRAMUSCULAR | Status: AC
  Administered 2015-10-25: 0.5 mL via INTRAMUSCULAR
  Filled 2015-10-25: qty 0.5

## 2015-10-25 NOTE — Progress Notes (Signed)
Radiation Oncology         [phone] ________________________________  Initial outpatient Consultation  Name: Jeanne Parker MRN: [id number]  Date: 10/25/2015  DOB: [birth year]  [id number] Asa Lente, MD  Rowe Clack, MD   REFERRING PHYSICIAN: Rowe Clack, MD  DIAGNOSIS:    ICD-9-CM ICD-10-CM   1. 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 female who presented with right breast upper distortion on screen tomo mammogram; US revealed 1.4 cm lesion at 1:00 position.  Biopsy showed Grade I IDC with low grade DCIS. There was also LCIS.  Testing showed disease to be ER/PR+ and HER2 negative.    She is otherwise in her USOH.  She has had at least 2 breast biopsies in the past, benign. Denies family history.    PREVIOUS RADIATION THERAPY: No  PAST MEDICAL HISTORY:  has a past medical history of Hypothyroid; History of colon polyps; History of chicken pox; Anxiety; and Breast cancer ([place]).    PAST SURGICAL HISTORY: Past Surgical History  Procedure Laterality Date  . Breast surgery  1998    fibroid adenoma     FAMILY HISTORY: family history includes Atrial fibrillation in her father; Colon cancer in her paternal grandfather; Coronary artery disease in her father; Diabetes (age of onset: 59) in her father; GER disease in her father; Hyperlipidemia in her father and mother; Hypertension in her father; Hypertension (age of onset: 26) in her mother.  SOCIAL HISTORY:  reports that she has never smoked. She has never used smokeless tobacco. She reports that she drinks alcohol. She reports that she does not use illicit drugs.  ALLERGIES: Review of patient's allergies indicates no known allergies.  MEDICATIONS:  Current Outpatient Prescriptions  Medication Sig Dispense Refill  .  citalopram (CELEXA) 20 MG tablet Take 1 tablet by mouth  daily 90 tablet 1  . eszopiclone (LUNESTA) 2 MG TABS tablet Take 1 tablet (2 mg total) by mouth at bedtime as needed for sleep. Take immediately before bedtime 90 tablet 0  . levothyroxine (SYNTHROID, LEVOTHROID) 75 MCG tablet Take 1 tablet by mouth  daily before breakfast 90 tablet 1  . tretinoin (RETIN-A) 0.05 % cream Apply topically at bedtime.     No current facility-administered medications for this encounter.    REVIEW OF SYSTEMS:  Notable for that above.  Wears contacts.  Thyroid disease. Otherwise negative per complete 15 point review.   PHYSICAL EXAM:   Vitals with BMI 10/25/2015  Height 5' 5"  Weight 140 lbs  BMI 78.2  Systolic 956  Diastolic 77  Pulse 79  Respirations 20   General: Alert and oriented, in no acute distress HEENT: Head is normocephalic. Extraocular movements are intact. Oropharynx is clear. Neck: Neck is supple, no palpable cervical or supraclavicular lymphadenopathy. Heart: Regular in rate and rhythm with no murmurs, rubs, or gallops. Chest: Clear to auscultation bilaterally, with no rhonchi, wheezes, or rales. Abdomen: Soft, nontender, nondistended, with no rigidity or guarding. Extremities: No cyanosis or edema. Lymphatics: see Neck Exam Skin: No concerning lesions. Musculoskeletal: symmetric strength and muscle tone throughout. Neurologic: Cranial nerves II through XII are grossly intact. No obvious focalities. Speech is fluent. Coordination is intact. Psychiatric: Judgment and insight are intact. Affect is appropriate. Breasts: Right breast - 12:00 post biopsy changes over ~2.5cm region/ecchymosis.  No axillary nodes bilaterally.  Left breast tissue - dense. No obvious masses.  ECOG = 0  0 - Asymptomatic (Fully active, able to carry on all predisease activities without restriction)  1 - Symptomatic but completely ambulatory (Restricted in physically strenuous activity but ambulatory and able  to carry out work of a light or sedentary nature. For example, light housework, office work)  2 - Symptomatic, <50% in bed during the day (Ambulatory and capable of all self care but unable to carry out any work activities. Up and about more than 50% of waking hours)  3 - Symptomatic, >50% in bed, but not bedbound (Capable of only limited self-care, confined to bed or chair 50% or more of waking hours)  4 - Bedbound (Completely disabled. Cannot carry on any self-care. Totally confined to bed or chair)  5 - Death   Eustace Pen MM, Creech RH, Tormey DC, et al. [phone]). "Toxicity and response criteria of the [facility] Group". [place] Oncol. 5 (6): 649-55   LABORATORY DATA:  Lab Results  Component Value Date   WBC 4.6 10/25/2015   HGB 13.0 10/25/2015   HCT 39.5 10/25/2015   MCV 95.0 10/25/2015   PLT 282 10/25/2015   CMP     Component Value Date/Time   NA 141 10/25/2015 1246   NA 135 06/29/2014 1044   K 4.0 10/25/2015 1246   K 4.0 06/29/2014 1044   CL 103 06/29/2014 1044   CO2 27 10/25/2015 1246   CO2 27 06/29/2014 1044   GLUCOSE 134 10/25/2015 1246   GLUCOSE 90 06/29/2014 1044   BUN 13.0 10/25/2015 1246   BUN 11 06/29/2014 1044   CREATININE 0.9 10/25/2015 1246   CREATININE 0.7 06/29/2014 1044   CALCIUM 9.7 10/25/2015 1246   CALCIUM 9.0 06/29/2014 1044   PROT 7.8 10/25/2015 1246   PROT 6.9 06/29/2014 1044   ALBUMIN 4.4 10/25/2015 1246   ALBUMIN 4.0 06/29/2014 1044   AST 22 10/25/2015 1246   AST 15 06/29/2014 1044   ALT 21 10/25/2015 1246   ALT 12 06/29/2014 1044   ALKPHOS 36* 10/25/2015 1246   ALKPHOS 21* 06/29/2014 1044   BILITOT 0.34 10/25/2015 1246   BILITOT 0.4 06/29/2014 1044         RADIOGRAPHY: Mm Digital Diagnostic Unilat R  10/11/2015  CLINICAL DATA:  Post ultrasound-guided biopsy of a suspicious mass in the 1 o'clock position of the right breast appear EXAM: DIAGNOSTIC RIGHT MAMMOGRAM POST ULTRASOUND BIOPSY COMPARISON:  Previous exam(s).  FINDINGS: Mammographic images were obtained following ultrasound guided biopsy of the mass in the right breast at the 1 o'clock position. A ribbon shaped biopsy marking clip is present along the anterior margin of the biopsied mass. IMPRESSION: Ribbon shaped biopsy marking clip along the anterior margin of the biopsied mass in the right breast at 1 o'clock. Final Assessment: Post Procedure Mammograms for Marker Placement Electronically Signed   By: Everlean Alstrom M.D.   On: 10/11/2015 11:16   Mm Radiologist Eval And Mgmt  10/13/2015  CONSULTATION: Patient Name: ANAYI, BRICCO Patient DOB:   [birth year] Age: 54 y/o MRN: [id number] Patient's Current Pain Level: 0 HPI: The patient returns for a followup after ultrasound-guided biopsy of a suspicious 1.3 cm mass in the upper inner right breast at the 1 o'clock position. The patient is doing well and denies any biopsy site complications. 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 This is concordant with the imaging findings. Breast prognostic markers are pending. Biopsy site: Biopsy site is healed over. There mild ecchymosis at the biopsy site. PLAN: All the patient's questions were answered. She and her husband are aware of the results and demonstrates understanding. The patient is scheduled to to be seen in the breast multidisciplinary cancer clinic 10/25/2015. Electronically Signed   By: Everlean Alstrom M.D.   On: 10/13/2015 14:46   US Breast Ltd Uni Right Inc Axilla  10/10/2015  CLINICAL DATA:  Patient recalled from screening for right breast architectural distortion. EXAM: DIGITAL DIAGNOSTIC RIGHT MAMMOGRAM WITH 3D TOMOSYNTHESIS ULTRASOUND RIGHT BREAST COMPARISON:  Previous exam(s). ACR Breast Density Category d: The breast tissue is extremely dense, which lowers the sensitivity of mammography. FINDINGS: Spot compression CC and MLO tomosynthesis  images of the right breast were obtained. These demonstrate a persistent area of distortion within the upper inner right breast posterior depth. On physical exam, I palpate no discrete mass within the upper inner right breast. Targeted ultrasound is performed, showing a 1.0 x 1.0 x 1.4 cm taller than wide irregular hypoechoic mass with internal color vascularity within the right breast 1 o'clock position 5 cm from the nipple, corresponding with mammographic abnormality. No cortically thickened or abnormal appearing right axillary lymph nodes. IMPRESSION: Suspicious right breast mass. RECOMMENDATION: Ultrasound-guided core needle biopsy suspicious right breast mass. Biopsy scheduled for 10/11/2015. I have discussed the findings and recommendations with the patient. Results were also provided in writing at the conclusion of the visit. If applicable, a reminder letter will be sent to the patient regarding the next appointment. BI-RADS CATEGORY  4: Suspicious. Electronically Signed   By: Lovey Newcomer M.D.   On: 10/10/2015 16:21   Mm Diag Breast Tomo Uni Right  10/10/2015  CLINICAL DATA:  Patient recalled from screening for right breast architectural distortion. EXAM: DIGITAL DIAGNOSTIC RIGHT MAMMOGRAM WITH 3D TOMOSYNTHESIS ULTRASOUND RIGHT BREAST COMPARISON:  Previous exam(s). ACR Breast Density Category d: The breast tissue is extremely dense, which lowers the sensitivity of mammography. FINDINGS: Spot compression CC and MLO tomosynthesis images of the right breast were obtained. These demonstrate a persistent area of distortion within the upper inner right breast posterior depth. On physical exam, I palpate no discrete mass within the upper inner right breast. Targeted ultrasound is performed, showing a 1.0 x 1.0 x 1.4 cm taller than wide irregular hypoechoic mass with internal color vascularity within the right breast 1 o'clock position 5 cm from the nipple, corresponding with mammographic abnormality. No  cortically thickened or abnormal appearing right axillary lymph nodes. IMPRESSION: Suspicious right breast mass. RECOMMENDATION: Ultrasound-guided core needle biopsy suspicious right breast mass. Biopsy scheduled for 10/11/2015. I have discussed the findings and recommendations with the patient. Results were also provided in writing at the conclusion of the visit. If applicable, a reminder letter will be sent to the patient regarding the next appointment. BI-RADS CATEGORY  4: Suspicious. Electronically Signed   By: Lovey Newcomer M.D.   On: 10/10/2015 16:21   [place] Rt Breast Bx W Loc Dev 1st Lesion Img Bx Spec US Guide  10/11/2015  CLINICAL DATA:  54 year old female with a suspicious mass in the upper inner right breast at the 1 o'clock position. EXAM: ULTRASOUND GUIDED RIGHT BREAST CORE NEEDLE BIOPSY COMPARISON:  Previous exam(s). FINDINGS: I met with the patient and we discussed the procedure of ultrasound-guided biopsy, including benefits and alternatives. We discussed the high likelihood of a successful procedure. We discussed the  risks of the procedure, including infection, bleeding, tissue injury, clip migration, and inadequate sampling. Informed written consent was given. The usual time-out protocol was performed immediately prior to the procedure. Using sterile technique and 2% Lidocaine as local anesthetic, under direct ultrasound visualization, a 12 gauge spring-loaded device was used to perform biopsy of the suspicious mass in the upper inner right breast at 1 o'clock using a lateral to medial approach. At the conclusion of the procedure a ribbon shaped tissue marker clip was deployed into the biopsy cavity. Follow up 2 view mammogram was performed and dictated separately. IMPRESSION: Ultrasound guided biopsy of the suspicious mass in the right breast at the 1 o'clock position. No apparent complications. Electronically Signed   By: Everlean Alstrom M.D.   On: 10/11/2015 11:14      IMPRESSION/PLAN: She  has been discussed at our multidisciplinary tumor board.  The consensus is that she would be a good candidate for breast conservation. I talked to her about the option of a mastectomy and informed her that her expected overall survival would be equivalent between mastectomy and breast conservation, based upon randomized controlled data. She is enthusiastic about breast conservation.   Oncotype testing to follow.  It was a pleasure meeting the patient today. We discussed the risks, benefits, and side effects of radiotherapy. We discussed that radiation would take approximately 4-6 weeks to complete and that I would give the patient a few weeks to heal following surgery before starting treatment planning. We spoke about acute effects including skin irritation and fatigue as well as much less common late effects including lung irritation. We spoke about the latest technology that is used to minimize the risk of late effects for breast cancer patients undergoing radiotherapy. No guarantees of treatment were given. The patient is enthusiastic about proceeding with treatment. I look forward to participating in the patient's care.    __________________________________________   Eppie Gibson, MD

## 2015-10-25 NOTE — Therapy (Signed)
[place], [place], [address] Phone: [phone]   Fax:  [phone]  Physical Therapy Evaluation  Patient Details  Name: Jeanne Parker MRN: [id number] Date of Birth: 1961/08/10 Referring Provider: Dr. Erroll Luna  Encounter Date: 10/25/2015      PT End of Session - 10/25/15 1540    Visit Number 1   Number of Visits 1   PT Start Time 1400   PT Stop Time 1428  Also saw pt from 1505 - 1522   PT Time Calculation (min) 28 min   Activity Tolerance Patient tolerated treatment well   Behavior During Therapy [facility] for tasks assessed/performed      Past Medical History  Diagnosis Date  . Hypothyroid   . History of colon polyps   . History of chicken pox   . Anxiety     situational  . Breast cancer [facility])     Past Surgical History  Procedure Laterality Date  . Breast surgery  1998    fibroid adenoma     There were no vitals filed for this visit.  Visit Diagnosis:  Carcinoma of right breast upper inner quadrant [facility]) - Plan: PT plan of care cert/re-cert      Subjective Assessment - 10/25/15 1532    Subjective Patient was seen today for a baseline assessment of her newly diagnosed right breast cancer.   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  It is ER/PR positive and HER2 negative measuring 1.4 cm located in the upper inner quadrant.   Patient Stated Goals Reduce lymphedema risk and learn post op shoulder ROM HEP   Currently in Pain? No/denies            [facility] PT Assessment - 10/25/15 0001    Assessment   Medical Diagnosis right breast cancer   Referring Provider Dr. Marcello Moores [place]   Onset Date/Surgical Date 09/29/15   Hand Dominance Right   Prior Therapy none   Precautions   Precautions Other (comment)  Active breast cancer   Restrictions   Weight Bearing Restrictions No   Balance Screen   Has  the patient fallen in the past 6 months No   Has the patient had a decrease in activity level because of a fear of falling?  No   Is the patient reluctant to leave their home because of a fear of falling?  No   Home Environment   Living Environment Private residence   Living Arrangements Children  49 and 67 y.o. 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   Leisure She runs twice a week for 15 minutes and lifts weights once a week   Cognition   Overall Cognitive Status Within Functional Limits for tasks assessed   Posture/Postural Control   Posture/Postural Control No significant limitations   ROM / Strength   AROM / PROM / Strength AROM;Strength   AROM   AROM Assessment Site Shoulder   Right/Left Shoulder Right;Left   Right Shoulder Extension 42 Degrees   Right Shoulder Flexion 150 Degrees   Right Shoulder ABduction 172 Degrees   Right Shoulder Internal Rotation 80 Degrees   Right Shoulder External Rotation 83 Degrees   Left Shoulder Extension 53 Degrees   Left Shoulder Flexion 155 Degrees  Left Shoulder ABduction 165 Degrees   Left Shoulder Internal Rotation 74 Degrees   Left Shoulder External Rotation 88 Degrees   Strength   Overall Strength Within functional limits for tasks performed           LYMPHEDEMA/ONCOLOGY QUESTIONNAIRE - 10/25/15 1538    Type   Cancer Type right breast cancer   Lymphedema Assessments   Lymphedema Assessments Upper extremities   Right Upper Extremity Lymphedema   10 cm Proximal to Olecranon Process 24.8 cm   Olecranon Process 22.5 cm   10 cm Proximal to Ulnar Styloid Process 19.2 cm   Just Proximal to Ulnar Styloid Process 14.5 cm   Across Hand at [employer] 17.8 cm   At [place] of 2nd Digit 6 cm   Left Upper Extremity Lymphedema   10 cm Proximal to Olecranon Process 24.8 cm   Olecranon  Process 22.5 cm   10 cm Proximal to Ulnar Styloid Process 18.5 cm   Just Proximal to Ulnar Styloid Process 14.2 cm   Across Hand at [employer] 17.7 cm   At [place] of 2nd Digit 5.7 cm      Patient was instructed today in a home exercise program today for post op shoulder range of motion. These included active assist shoulder flexion in sitting, scapular retraction, wall walking with shoulder abduction, and hands behind head external rotation.  She was encouraged to do these twice a day, holding 3 seconds and repeating 5 times when permitted by her physician.         PT Education - 10/25/15 1539    Education provided Yes   Education Details Post op shoulder ROM HEP and lymphedema risk reduction   Person(s) Educated Patient   Methods Explanation;Demonstration;Handout   Comprehension Returned demonstration;Verbalized understanding              Breast Clinic Goals - 10/25/15 1542    Patient will be able to verbalize understanding of pertinent lymphedema risk reduction practices relevant to her diagnosis specifically related to skin care.   Time 1   Period Days   Status Achieved   Patient will be able to return demonstrate and/or verbalize understanding of the post-op home exercise program related to regaining shoulder range of motion.   Time 1   Period Days   Status Achieved   Patient will be able to verbalize understanding of the importance of attending the postoperative After Breast Cancer Class for further lymphedema risk reduction education and therapeutic exercise.   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  It is ER/PR positive and HER2 negative measuring 1.4 cm located in the upper inner quadrant.  She is planning to have a right lumpectomy with a sentinel node biopsy followed by Oncotype testing,  radiation, and anti-estrogen therapy.  She may benefit from post op PT to regain shoulder ROM and strength and reduce lymphedema risk.   Pt will benefit from skilled therapeutic intervention in order to improve on the following deficits Decreased strength;Decreased knowledge of precautions;Pain;Impaired UE functional use;Decreased range of motion   Rehab Potential Excellent   Clinical Impairments Affecting Rehab Potential none   PT Frequency One time visit   PT Treatment/Interventions Therapeutic exercise;Patient/family education   Consulted and Agree with Plan  of Care Patient     Patient will follow up at outpatient cancer rehab if needed following surgery.  If the patient requires physical therapy at that time, a specific plan will be dictated and sent to the referring physician for approval. The patient was educated today on appropriate basic range of motion exercises to begin post operatively and the importance of attending the After Breast Cancer class following surgery.  Patient was educated today on lymphedema risk reduction practices as it pertains to recommendations that will benefit the patient immediately following surgery.  She verbalized good understanding.  No additional physical therapy is indicated at this time.       Problem List Patient Active Problem List   Diagnosis Date Noted  . Breast cancer of upper-inner quadrant of right female breast ([place]) 10/16/2015  . Generalized anxiety disorder 01/04/2013  . Unspecified hypothyroidism   . Insomnia 01/20/2012  . Snoring 01/20/2012    Annia Friendly, PT 10/25/2015 3:44 PM   [place], [place], [address] Phone: [phone]   Fax:  [phone]  Name: Jeanne Parker MRN: [id number] Date of Birth: [birth year]

## 2015-10-25 NOTE — Progress Notes (Signed)
[facility] NOTE  Patient Care Team: Rowe Clack, MD as PCP - General (Internal Medicine) Rigoberto Noel, MD (Pulmonary Disease) Marylynn Pearson, MD (Obstetrics and Gynecology) Juanita Craver, MD (Gastroenterology) Erroll Luna, MD as Consulting Physician (General Surgery) Nicholas Lose, MD as Consulting Physician (Hematology and Oncology) Eppie Gibson, MD as Attending Physician (Radiation Oncology)  CHIEF COMPLAINTS/PURPOSE OF CONSULTATION:  Newly diagnosed breast cancer  HISTORY OF PRESENTING ILLNESS:  Jeanne Parker 54 y.o. female is here because of recent diagnosis of left breast cancer. She had a screening mammogram that revealed a right breast distortion. At L1 o'clock position there was a 1 x 1 x 1.4 cm lesion. She then underwent ultrasound-guided biopsy which revealed a grade 1 invasive ductal carcinoma with low-grade DCIS plus LCIS. It was ER/PR positive HER-2 negative with a case summary 3%. She had previous history of benign left breast surgery with her to court for fibroadenomas in 1998. She was presented this morning in the multidisciplinary tumor board and she is here today to discuss a treatment plan.  I reviewed her records extensively and collaborated the history with the patient.  SUMMARY OF ONCOLOGIC HISTORY:   Breast cancer of upper-inner quadrant of right female breast ([place])   10/10/2015 Mammogram Screening mammogram revealed right breast distortion, extremely dense breasts, at 1:00 position 1 x 1 x 1.4 cm   10/11/2015 Initial Diagnosis Right breast biopsy 1:00: Invasive ductal carcinoma with DCIS and LCIS ER 95%, PR 80%, HER-2 negative ratio 1.15, Ki-67 3%    In terms of breast cancer risk profile:  She menarched at early age of 17 and is still premenopausal last period was in October 2016 but they're not regular  She had 3 pregnancy, her first child was born at age 31  She has received birth control pills for approximately 4 years.   She was never exposed to fertility medications or hormone replacement therapy.  She has  family history of Breast/GYN/GI cancer Sister LCIS 2016 at age of 39, grandfather paternal side age 68 with colon cancer  MEDICAL HISTORY:  Past Medical History  Diagnosis Date  . Hypothyroid   . History of colon polyps   . History of chicken pox   . Anxiety     situational  . Breast cancer [facility])     SURGICAL HISTORY: Past Surgical History  Procedure Laterality Date  . Breast surgery  1998    fibroid adenoma     SOCIAL HISTORY: Social History   Social History  . Marital Status: Divorced    Spouse Name: N/A  . Number of Children: 3  . 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 Smoking status: Never Smoker   . Smokeless tobacco: Never Used  . Alcohol Use: Yes     Comment: 1 to 2 glasses of wine a week   . Drug Use: No  . Sexual Activity: Not on file   Other Topics Concern  . Not on file   Social History Narrative    FAMILY HISTORY: Family History  Problem Relation Age of Onset  . Diabetes Father 53  . Hyperlipidemia Father   . Colon cancer Paternal Grandfather     died age 67  . Hypertension Father   . GER disease Father   . Hypertension Mother 62  . Hyperlipidemia Mother   . Coronary artery disease Father  angioplasty age 96s  . Atrial fibrillation Father     on coumadin    ALLERGIES:  has No Known Allergies.  MEDICATIONS:  Current Outpatient Prescriptions  Medication Sig Dispense Refill  . citalopram (CELEXA) 20 MG tablet Take 1 tablet by mouth  daily 90 tablet 1  . eszopiclone (LUNESTA) 2 MG TABS tablet Take 1 tablet (2 mg total) by mouth at bedtime as needed for sleep. Take immediately before bedtime 90 tablet 0  . levothyroxine (SYNTHROID, LEVOTHROID) 75 MCG tablet Take 1 tablet by mouth  daily before breakfast 90 tablet 1  . tretinoin (RETIN-A) 0.05 % cream Apply  topically at bedtime.     No current facility-administered medications for this visit.    REVIEW OF SYSTEMS:   Constitutional: Denies fevers, chills or abnormal night sweats Eyes: Denies blurriness of vision, double vision or watery eyes Ears, nose, mouth, throat, and face: Denies mucositis or sore throat Respiratory: Denies cough, dyspnea or wheezes Cardiovascular: Denies palpitation, chest discomfort or lower extremity swelling Gastrointestinal:  Denies nausea, heartburn or change in bowel habits Skin: Denies abnormal skin rashes Lymphatics: Denies new lymphadenopathy or easy bruising Neurological:Denies numbness, tingling or new weaknesses Behavioral/Psych: Mood is stable, no new changes  Breast:  Denies any palpable lumps or discharge All other systems were reviewed with the patient and are negative.  PHYSICAL EXAMINATION: ECOG PERFORMANCE STATUS: 0 - Asymptomatic  Filed Vitals:   10/25/15 1303  BP: 131/77  Pulse: 79  Temp: 97.6 F (36.4 C)  Resp: 20   Filed Weights   10/25/15 1303  Weight: 140 lb (63.504 kg)    GENERAL:alert, no distress and comfortable SKIN: skin color, texture, turgor are normal, no rashes or significant lesions EYES: normal, conjunctiva are pink and non-injected, sclera clear OROPHARYNX:no exudate, no erythema and lips, buccal mucosa, and tongue normal  NECK: supple, thyroid normal size, non-tender, without nodularity LYMPH:  no palpable lymphadenopathy in the cervical, axillary or inguinal LUNGS: clear to auscultation and percussion with normal breathing effort HEART: regular rate & rhythm and no murmurs and no lower extremity edema ABDOMEN:abdomen soft, non-tender and normal bowel sounds Musculoskeletal:no cyanosis of digits and no clubbing  PSYCH: alert & oriented x 3 with fluent speech NEURO: no focal motor/sensory deficits BREAST: No palpable nodules in breast. No palpable axillary or supraclavicular lymphadenopathy (exam performed in the  presence of a chaperone)   LABORATORY DATA:  I have reviewed the data as listed Lab Results  Component Value Date   WBC 4.6 10/25/2015   HGB 13.0 10/25/2015   HCT 39.5 10/25/2015   MCV 95.0 10/25/2015   PLT 282 10/25/2015   Lab Results  Component Value Date   NA 141 10/25/2015   K 4.0 10/25/2015   CL 103 06/29/2014   CO2 27 10/25/2015    ASSESSMENT AND PLAN:  Breast cancer of upper-inner quadrant of right female breast ([facility]) Right breast biopsy 10/11/2015, 1:00: Invasive ductal carcinoma with DCIS and LCIS ER 95%, PR 80%, HER-2 negative ratio 1.15, Ki-67 3%, mammogram and ultrasound revealed a 1 x 1 x 1.4 cm area of distortion, T1 cN0 M0 stage I a clinical stage  Pathology and radiology counseling:Discussed with the patient, the details of pathology including the type of breast cancer,the clinical staging, the significance of ER, PR and HER-2/neu receptors and the implications for treatment. After reviewing the pathology in detail, we proceeded to discuss the different treatment options between surgery, radiation, chemotherapy, antiestrogen therapies.  Recommendations: 1. Breast conserving surgery followed  by 2. Oncotype DX testing to determine if chemotherapy would be of any benefit followed by 3. Adjuvant radiation therapy followed by 4. Adjuvant antiestrogen therapy  Oncotype counseling: I discussed Oncotype DX test. I explained to the patient that this is a 21 gene panel to evaluate patient tumors DNA to calculate recurrence score. This would help determine whether patient has high risk or intermediate risk or low risk breast cancer. She understands that if her tumor was found to be high risk, she would benefit from systemic chemotherapy. If low risk, no need of chemotherapy. If she was found to be intermediate risk, we would need to evaluate the score as well as other risk factors and determine if an abbreviated chemotherapy may be of benefit.  Return to clinic after surgery to  discuss final pathology report and then determine if Oncotype DX testing will need to be sent.    All questions were answered. The patient knows to call the clinic with any problems, questions or concerns.    Rulon Eisenmenger, MD 3:24 PM

## 2015-10-25 NOTE — Assessment & Plan Note (Signed)
Right breast biopsy 10/11/2015, 1:00: Invasive ductal carcinoma with DCIS and LCIS ER 95%, PR 80%, HER-2 negative ratio 1.15, Ki-67 3%, mammogram and ultrasound revealed a 1 x 1 x 1.4 cm area of distortion, T1 cN0 M0 stage I a clinical stage  Pathology and radiology counseling:Discussed with the patient, the details of pathology including the type of breast cancer,the clinical staging, the significance of ER, PR and HER-2/neu receptors and the implications for treatment. After reviewing the pathology in detail, we proceeded to discuss the different treatment options between surgery, radiation, chemotherapy, antiestrogen therapies.  Recommendations: 1. Breast conserving surgery followed by 2. Oncotype DX testing to determine if chemotherapy would be of any benefit followed by 3. Adjuvant radiation therapy followed by 4. Adjuvant antiestrogen therapy  Oncotype counseling: I discussed Oncotype DX test. I explained to the patient that this is a 21 gene panel to evaluate patient tumors DNA to calculate recurrence score. This would help determine whether patient has high risk or intermediate risk or low risk breast cancer. She understands that if her tumor was found to be high risk, she would benefit from systemic chemotherapy. If low risk, no need of chemotherapy. If she was found to be intermediate risk, we would need to evaluate the score as well as other risk factors and determine if an abbreviated chemotherapy may be of benefit.  Return to clinic after surgery to discuss final pathology report and then determine if Oncotype DX testing will need to be sent.

## 2015-10-25 NOTE — H&P (Signed)
ileen G. Fitzgerald 10/25/2015 7:54 AM Location: [facility] Patient #: [id number] DOB: 05/23/1961 Undefined / Language: English / Race: White Female  History of Present Illness (Sailor Hevia A. Meghana Tullo MD; 10/25/2015 4:12 PM) Patient words: right breast cancer  Patient presents at the request of Dr. Squire for right breast cancer. Patient went for screening mammogram a 1.3 cm mass in the right upper quadrant was identified. Biopsy doneER PR positive HER-2/neu negative.ER positive PR positive HER-2/neu negative. Patient denies symptoms of breast pain, nipple discharge or mass..        Patient: FITZGERALD, Anaisha G Collected: 10/11/2015 Client: [facility] Accession: [id number] Received: 10/11/2015 Jennifer A Jarosz, MD DOB: 01/23/1961 Age: 54 Gender: F Reported: 10/13/2015 [address] Patient Ph: [phone] MRN #: [id number] [place], [place] [address] Client Acc#: Chart #: [id number] Phone: Fax: CC: GPA INTERNAL CC Valerie Leschber CC: ADKINS, GRETCHEN REPORT OF SURGICAL PATHOLOGY ADDITIONAL INFORMATION: PROGNOSTIC INDICATORS Results: IMMUNOHISTOCHEMICAL AND MORPHOMETRIC ANALYSIS PERFORMED MANUALLY Estrogen Receptor: 95%, POSITIVE, STRONG STAINING INTENSITY Progesterone Receptor: 80%, POSITIVE, STRONG STAINING INTENSITY Proliferation Marker Ki67: 3% REFERENCE RANGE ESTROGEN RECEPTOR NEGATIVE 0% POSITIVE =>1% REFERENCE RANGE PROGESTERONE RECEPTOR NEGATIVE 0% POSITIVE =>1% All controls stained appropriately JOSHUA KISH MD Pathologist, Electronic Signature ( Signed 10/17/2015) FLUORESCENCE IN-SITU HYBRIDIZATION Results: HER2 - NEGATIVE RATIO OF HER2/CEP17 SIGNALS 1.15 AVERAGE HER2 COPY NUMBER PER CELL 1.95 Reference Range: NEGATIVE HER2/CEP17 Ratio <2.0 and average HER2 copy number <4.0 EQUIVOCAL HER2/CEP17 Ratio <2.0 and average HER2 copy number >=4.0 and <6.0 1 of 3 FINAL for FITZGERALD, Yehudit G  ([id number]) ADDITIONAL INFORMATION:(continued) POSITIVE HER2/CEP17 Ratio >=2.0 or <2.0 and average HER2 copy number >=6.0 JOSHUA KISH MD Pathologist, Electronic Signature ( Signed 10/17/2015) FINAL DIAGNOSIS Diagnosis Breast, right, needle core biopsy, 1 o'clock - 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 - SEE COMMENT. Microscopic Comment An E-Cadherin immunohistochemical stain is performed after review of the H&E stained sections. The morphology coupled with the staining pattern is consistent with the above diagnosis. Although definitive grading of breast carcinoma is best done on excision, the features of the invasive tumor from the right 1 o'clock biopsy are compatible with a grade I breast carcinoma. Breast prognostic markers will be performed and reported in an addendum. Findings are called to [facility] on 10/13/2015. Dr. Rund has seen this case in consultation with agreement. (RH:kh 10/13/15) ROBERT HILLARD MD Pathologist, Electronic Signature (Case signed 10/13/2015) Specimen Gross and Clinical Information Specimen Comment In formalin 10:40; suspicious right breast mass Specimen(s) Obtained: Breast, right, needle core biopsy, 1 o'clock Specimen Clinical Information Suspect IDC Gross Received in formalin are 4 cores of soft, tan-red tissue which measure 0.6 x 0.3 x 0.2 cm and up to 2.0 x 0.3 x 0.2 cm. Time in Formalin 10:40 am.Submitted in toto in 1 block(s) Stain(s) used in Diagnosis: The following stain(s) were used in diagnosing the case: Her2 FISH, PR-ACIS, KI-67-ACIS, ER-ACIS, E-CAD. The control(s) stained appropriately. 2 of 3 FINAL for FITZGERALD, Nisreen G ([id number]) Disclaimer HER2 IQFISH pharmDX (code K5731) is a direct fluorescence in-situ hybridization assay designed to quantitatively determine HER2 gene amplification in formalin-fixed, paraffin-embedded tissue specimens. It is performed at  [facility] and is reported using ASCO/CAP scoring criteria published in 2013. Some of these immunohistochemical stains may have been developed and the performance characteristics determined by [facility]. Some may not have been cleared or approved by the U.S. Food and Drug Administration. The FDA has   determined that such clearance or approval is not necessary. This test is used for clinical purposes. It should not be regarded as investigational or for research. This laboratory is certified under the Clinical Laboratory Improvement Amendments of 1988 (CLIA-88) as qualified to perform high complexity clinical laboratory testing. PR progesterone receptor (PgR 636), immunohistochemical stains are performed on formalin fixed, paraffin embedded tissue using a 3,3"-diaminobenzidine (DAB) chromogen and DAKO Autostainer System. The staining intensity of the nucleus is scored morphometrically using the Automated Cellular Imaging System (ACIS) and is reported as the percentage of tumor cell nuclei demonstrating specific nuclear staining. Ki-67 (Mib-1), immunohistochemical stains are performed on formalin fixed, paraffin embedded tissue using a 3,3"-diaminobenzidine (DAB) chromogen and DAKO Autostainer System. The staining intensity of the nucleus is scored morphometrically using the Automated Cellular Imaging System (ACIS) and is reported as the percentage of tumor cell nuclei demonstrating specific nuclear staining. Estrogen receptor (SP1), immunohistochemical stains are performed on formalin fixed, paraffin embedded tissue using a 3,3"-diaminobenzidine (DAB) chromogen and DAKO Autostainer System. The staining intensity of the nucleus is scored morphometrically using the Automated Cellular Imaging System (ACIS) and is reported as the percentage of tumor cell nuclei demonstrating specific nuclear staining. Report signed out from the following location(s) Technical Component was performed  at [facility] [address],[place] [address].CLIA:[id number],CAP:[id number]., Technical Component was performed at [facility] [address], [place], [place] [address]. CLIA #: [id number], Technical component and interpretation was performed at [facility] [address], [place], [place] [address]. CLIA #: [id number], 3 of           CONSULTATION: Patient Name: FITZGERALD, Taliana G Patient DOB: 01/16/1961 Age: 53 y/o MRN: [id number] Patient's Current Pain Level: 0 HPI: The patient returns for a followup after ultrasound-guided biopsy of a suspicious 1.3 cm mass in the upper inner right breast at the 1 o'clock position. The patient is doing well and denies any biopsy site complications. 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 This is concordant with the imaging findings. Breast prognostic markers are pending. Biopsy site: Biopsy site is healed over. There mild ecchymosis at the biopsy site. PLAN: All the patient's questions were answered. She and her husband are aware of the results and demonstrates understanding. The patient is scheduled to to be seen in the breast multidisciplinary cancer clinic 10/25/2015. Electronically Signed By: Jennifer Jarosz M.D. On: 10/13/2015 14:46 .  The patient is a 54 year old female.   Other Problems (Wendy Smith, RN; 10/25/2015 7:54 AM) Anxiety Disorder Breast Cancer Hypercholesterolemia Lump In Breast Thyroid Disease  Past Surgical History (Wendy Smith, RN; 10/25/2015 7:54 AM) Breast Biopsy Bilateral. Colon Polyp Removal - Colonoscopy  Diagnostic Studies History (Wendy Smith, RN; 10/25/2015 7:54 AM) Colonoscopy within last year Mammogram within last year Pap Smear 1-5 years ago  Medication History (Wendy Smith, RN; 10/25/2015 7:54 AM) No Current  Medications Medications Reconciled  Social History (Wendy Smith, RN; 10/25/2015 7:54 AM) Alcohol use Occasional alcohol use. Caffeine use Carbonated beverages, Coffee, Tea. Tobacco use Never smoker.  Family History (Wendy Smith, RN; 10/25/2015 7:54 AM) Arthritis Father. Colon Cancer Family Members In General. Colon Polyps Family Members In General. Diabetes Mellitus Father. Heart Disease Father. Hypertension Father.  Pregnancy / Birth History (Wendy Smith, RN; 10/25/2015 7:54 AM) Age at menarche 13 years. Age of menopause [age] Contraceptive History Oral contraceptives. Gravida 5 Irregular periods Maternal age 26-30 Para 3       Review of Systems (Wendy Smith RN; 10/25/2015 7:54 AM) General Not Present- Appetite Loss, Chills, Fatigue, Fever, Night Sweats, Weight Gain and Weight Loss. Skin Not Present- Change in Wart/Mole, Dryness, Hives, Jaundice, New Lesions, Non-Healing Wounds, Rash and Ulcer. HEENT Present- Nose Bleed, Seasonal Allergies, Sinus Pain and Wears glasses/contact lenses. Not Present- Earache, Hearing Loss, Hoarseness, Oral Ulcers, Ringing in the Ears, Sore Throat, Visual Disturbances and Yellow Eyes. Respiratory Present- Snoring. Not Present- Bloody sputum, Chronic Cough, Difficulty Breathing and Wheezing. Breast Not Present- Breast Mass, Breast Pain, Nipple Discharge and Skin Changes. Cardiovascular Not Present- Chest Pain, Difficulty Breathing Lying Down, Leg Cramps, Palpitations, Rapid Heart Rate, Shortness of Breath and Swelling of Extremities. Gastrointestinal Not Present- Abdominal Pain, Bloating, Bloody Stool, Change in Bowel Habits, Chronic diarrhea, Constipation, Difficulty Swallowing, Excessive gas, Gets full quickly at meals, Hemorrhoids, Indigestion, Nausea, Rectal Pain and Vomiting. Female Genitourinary Not Present- Frequency, Nocturia, Painful Urination, Pelvic Pain and Urgency. Musculoskeletal Not Present- Back Pain, Joint Pain, Joint  Stiffness, Muscle Pain, Muscle Weakness and Swelling of Extremities. Neurological Not Present- Decreased Memory, Fainting, Headaches, Numbness, Seizures, Tingling, Tremor, Trouble walking and Weakness. Psychiatric Present- Anxiety. Not Present- Bipolar, Change in Sleep Pattern, Depression, Fearful and Frequent crying. Endocrine Not Present- Cold Intolerance, Excessive Hunger, Hair Changes, Heat Intolerance, Hot flashes and New Diabetes. Hematology Not Present- Easy Bruising, Excessive bleeding, Gland problems, HIV and Persistent Infections.   Physical Exam (Artina Minella A. Baran Kuhrt MD; 10/25/2015 4:13 PM)  General Mental Status-Alert. General Appearance-Consistent with stated age. Hydration-Well hydrated. Voice-Normal.  Head and Neck Head-normocephalic, atraumatic with no lesions or palpable masses. Trachea-midline. Thyroid Gland Characteristics - normal size and consistency.  Eye Eyeball - Bilateral-Extraocular movements intact. Sclera/Conjunctiva - Bilateral-No scleral icterus.  Chest and Lung Exam Chest and lung exam reveals -quiet, even and easy respiratory effort with no use of accessory muscles and on auscultation, normal breath sounds, no adventitious sounds and normal vocal resonance. Inspection Chest Wall - Normal. Back - normal.  Breast Breast - Left-Symmetric, Non Tender, No Biopsy scars, no Dimpling, No Inflammation, No Lumpectomy scars, No Mastectomy scars, No Peau d' Orange. Breast - Right-Symmetric, Non Tender, No Biopsy scars, no Dimpling, No Inflammation, No Lumpectomy scars, No Mastectomy scars, No Peau d' Orange. Breast Lump-No Palpable Breast Mass. Note: LIGHT BRUISING RIGHT BREAST.  Cardiovascular Cardiovascular examination reveals -normal heart sounds, regular rate and rhythm with no murmurs and normal pedal pulses bilaterally.  Abdomen Inspection Inspection of the abdomen reveals - No Hernias. Skin - Scar - no surgical  scars. Palpation/Percussion Palpation and Percussion of the abdomen reveal - Soft, Non Tender, No Rebound tenderness, No Rigidity (guarding) and No hepatosplenomegaly. Auscultation Auscultation of the abdomen reveals - Bowel sounds normal.  Neurologic Neurologic evaluation reveals -alert and oriented x 3 with no impairment of recent or remote memory. Mental Status-Normal.  Musculoskeletal Normal Exam - Left-Upper Extremity Strength Normal and Lower Extremity Strength Normal. Normal Exam - Right-Upper Extremity Strength Normal and Lower Extremity Strength Normal.  Lymphatic Head & Neck  General Head & Neck Lymphatics: Bilateral - Description - Normal. Axillary  General Axillary Region: Bilateral - Description - Normal. Tenderness - Non Tender. Femoral & Inguinal  Generalized Femoral & Inguinal Lymphatics: Bilateral - Description - Normal. Tenderness - Non Tender.    Assessment & Plan (Cece Milhouse A. Chistian Kasler MD; 10/25/2015 4:13 PM)  BREAST CANCER, RIGHT (C50.911) Impression: DISCUCONSTRUCTIEL LYMPH NODE MAPPING.REAST LUMPECTOMY WITH SENTINEL LYMPH NODE MAPPING.REAST LUMPECTOMY WITH SENTINEL LYMPH NODE MAPPING. sHE DESIRES BREAST CONSERVATION. Risk of lumpectomy include   bleeding, infection, seroma, more surgery, use of seed/wire, wound care, cosmetic deformity and the need for other treatments, death , blood clots, death. Pt agrees to proceed. Risk of sentinel lymph node mapping include bleeding, infection, lymphedema, shoulder pain. stiffness, dye allergy. cosmetic deformity , blood clots, death, need for more surgery. Pt agres to proceed.  Current Plans You are being scheduled for surgery - Our schedulers will call you.  You should hear from our office's scheduling department within 5 working days about the location, date, and time of surgery. We try to make accommodations for patient's preferences in scheduling surgery, but sometimes the OR schedule or the surgeon's schedule  prevents us from making those accommodations.  If you have not heard from our office ([phone]) in 5 working days, call the office and ask for your surgeon's nurse.  If you have other questions about your diagnosis, plan, or surgery, call the office and ask for your surgeon's nurse.  Pt Education - [facility] Breast Cancer Information Given - [facility] "Breast Journey" Package We discussed the staging and pathophysiology of breast cancer. We discussed all of the different options for treatment for breast cancer including surgery, chemotherapy, radiation therapy, Herceptin, and antiestrogen therapy. We discussed a sentinel lymph node biopsy as she does not appear to having lymph node involvement right now. We discussed the performance of that with injection of radioactive tracer and blue dye. We discussed that she would have an incision underneath her axillary hairline. We discussed that there is a bout a 10-20% chance of having a positive node with a sentinel lymph node biopsy and we will await the permanent pathology to make any other first further decisions in terms of her treatment. One of these options might be to return to the operating room to perform an axillary lymph node dissection. We discussed about a 1-2% risk lifetime of chronic shoulder pain as well as lymphedema associated with a sentinel lymph node biopsy. We discussed the options for treatment of the breast cancer which included lumpectomy versus a mastectomy. We discussed the performance of the lumpectomy with a wire placement. We discussed a 10-20% chance of a positive margin requiring reexcision in the operating room. We also discussed that she may need radiation therapy or antiestrogen therapy or both if she undergoes lumpectomy. We discussed the mastectomy and the postoperative care for that as well. We discussed that there is no difference in her survival whether she undergoes lumpectomy with radiation therapy or antiestrogen therapy versus  a mastectomy. There is a slight difference in the local recurrence rate being 3-5% with lumpectomy and about 1% with a mastectomy. We discussed the risks of operation including bleeding, infection, possible reoperation. She understands her further therapy will be based on what her stages at the time of her operation.  Pt Education - [facility]b breast cancer surgery: discussed with patient and provided information. Pt Education - [facility] Breast Biopsy HCI: discussed with patient and provided information. Pt Education - ABC (After Breast Cancer) Class Info: discussed with patient and provided information. 

## 2015-10-25 NOTE — Patient Instructions (Signed)

## 2015-10-27 NOTE — Progress Notes (Signed)
   [facility] Psychosocial Distress Screening Counseling Intern  Counseling Intern was referred by distress screening protocol.  The patient scored a 9 on the Psychosocial Distress Thermometer which indicates Severe distress. Counseling Intern Vaughan Sine to assess for distress and other psychosocial needs.    ONCBCN DISTRESS SCREENING 10/25/2015  Screening Type Initial Screening  Distress experienced in past week (1-10) 9  Family Problem type Children  Emotional problem type Nervousness/Anxiety;Adjusting to illness;Feeling hopeless  Spiritual/Religous concerns type Facing my mortality  Information Concerns Type Lack of info about diagnosis;Lack of info about treatment;Lack of info about complementary therapy choices;Lack of info about maintaining fitness  Referral to support programs Yes   Counseling Intern Note:  Met with Pt and significant other Bill at [facility]. Pt reported that her distress had deceased from a 87 to a 6 by the time of our encounter due to the gaining of information about there Dx and Tx during clinic.  Pt reports having and adequate support network including her sister, parents, and sig. Other.  Counseling intern introduced and provided information about services available at the support center.  FU: No specific support center follow up is indicated at this time.    Vaughan Sine Counseling Intern [phone]

## 2015-10-30 ENCOUNTER — Telehealth: Payer: Self-pay | Admitting: *Deleted

## 2015-10-30 NOTE — Telephone Encounter (Signed)
Spoke to pt concerning [place] from 10/25/15. Denies questions or concerns regarding dx or treatment care plan. Encourage pt to call with needs. Received verbal understanding.

## 2015-11-01 ENCOUNTER — Other Ambulatory Visit: Payer: Self-pay | Admitting: Surgery

## 2015-11-01 ENCOUNTER — Ambulatory Visit (INDEPENDENT_AMBULATORY_CARE_PROVIDER_SITE_OTHER): Payer: 59 | Admitting: Psychology

## 2015-11-01 DIAGNOSIS — F4322 Adjustment disorder with anxiety: Secondary | ICD-10-CM | POA: Diagnosis not present

## 2015-11-01 DIAGNOSIS — C50911 Malignant neoplasm of unspecified site of right female breast: Secondary | ICD-10-CM

## 2015-11-07 ENCOUNTER — Encounter: Payer: Self-pay | Admitting: Internal Medicine

## 2015-11-07 ENCOUNTER — Other Ambulatory Visit: Payer: Self-pay | Admitting: *Deleted

## 2015-11-07 ENCOUNTER — Other Ambulatory Visit (INDEPENDENT_AMBULATORY_CARE_PROVIDER_SITE_OTHER): Payer: 59

## 2015-11-07 ENCOUNTER — Ambulatory Visit (INDEPENDENT_AMBULATORY_CARE_PROVIDER_SITE_OTHER): Payer: 59 | Admitting: Internal Medicine

## 2015-11-07 ENCOUNTER — Telehealth: Payer: Self-pay | Admitting: Hematology and Oncology

## 2015-11-07 VITALS — BP 122/70 | HR 60 | Temp 97.6°F | Ht 65.0 in | Wt 138.4 lb

## 2015-11-07 DIAGNOSIS — Z114 Encounter for screening for human immunodeficiency virus [HIV]: Secondary | ICD-10-CM

## 2015-11-07 DIAGNOSIS — E039 Hypothyroidism, unspecified: Secondary | ICD-10-CM | POA: Diagnosis not present

## 2015-11-07 DIAGNOSIS — Z0000 Encounter for general adult medical examination without abnormal findings: Secondary | ICD-10-CM

## 2015-11-07 DIAGNOSIS — Z23 Encounter for immunization: Secondary | ICD-10-CM

## 2015-11-07 DIAGNOSIS — Z1159 Encounter for screening for other viral diseases: Secondary | ICD-10-CM

## 2015-11-07 DIAGNOSIS — C50211 Malignant neoplasm of upper-inner quadrant of right female breast: Secondary | ICD-10-CM

## 2015-11-07 DIAGNOSIS — R5383 Other fatigue: Secondary | ICD-10-CM

## 2015-11-07 LAB — URINALYSIS, ROUTINE W REFLEX MICROSCOPIC
Bilirubin Urine: NEGATIVE
Hgb urine dipstick: NEGATIVE
Ketones, ur: NEGATIVE
Leukocytes, UA: NEGATIVE
Nitrite: NEGATIVE
RBC / HPF: NONE SEEN (ref 0–?)
Specific Gravity, Urine: <=1.005 — AB (ref 1.000–1.030)
Total Protein, Urine: NEGATIVE
Urine Glucose: NEGATIVE
Urobilinogen, UA: 0.2 (ref 0.0–1.0)
WBC, UA: NONE SEEN (ref 0–?)
pH: 7.5 (ref 5.0–8.0)

## 2015-11-07 LAB — LIPID PANEL
Cholesterol: 267 mg/dL — ABNORMAL HIGH (ref 0–200)
HDL: 70.6 mg/dL (ref >39.00)
LDL Cholesterol: 179 mg/dL — ABNORMAL HIGH (ref 0–99)
NonHDL: 196.57
Total CHOL/HDL Ratio: 4
Triglycerides: 90 mg/dL (ref 0.0–149.0)
VLDL: 18 mg/dL (ref 0.0–40.0)

## 2015-11-07 LAB — HIV ANTIBODY (ROUTINE TESTING W REFLEX): HIV 1&2 Ab, 4th Generation: NONREACTIVE

## 2015-11-07 LAB — VITAMIN D 25 HYDROXY (VIT D DEFICIENCY, FRACTURES): VITD: 28.13 ng/mL — ABNORMAL LOW (ref 30.00–100.00)

## 2015-11-07 LAB — TSH: TSH: 3.18 u[IU]/mL (ref 0.35–4.50)

## 2015-11-07 LAB — VITAMIN B12: Vitamin B-12: 513 pg/mL (ref 211–911)

## 2015-11-07 MED ORDER — LEVOTHYROXINE SODIUM 75 MCG PO TABS: 75.0000 ug | ORAL_TABLET | Freq: Every day | ORAL | Status: DC

## 2015-11-07 MED ORDER — CITALOPRAM HYDROBROMIDE 20 MG PO TABS: 20.0000 mg | ORAL_TABLET | Freq: Every day | ORAL | Status: DC

## 2015-11-07 NOTE — Telephone Encounter (Signed)
lvm for pt regarding to DEC appt.... °

## 2015-11-07 NOTE — Progress Notes (Signed)
Subjective:    Patient ID: Jeanne Parker, female    DOB: 31-Aug-1961, 54 y.o.   MRN: [id number]  HPI  patient is here today for annual physical. Patient feels well and has no complaints. Also reviewed chronic medical conditions, interval events and current concerns - dx last month with breast ca, lumpectomy next week   Past Medical History  Diagnosis Date  . Hypothyroid   . History of colon polyps   . History of chicken pox   . Anxiety     situational  . 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 Diabetes Father 23  . Hyperlipidemia Father   . Colon cancer Paternal Grandfather     died age 45  . Hypertension Father   . GER disease Father   . Hypertension Mother 72  . Hyperlipidemia Mother   . Coronary artery disease Father     angioplasty age 53s  . Atrial fibrillation Father     on coumadin   Social History  Substance Use Topics  . Smoking status: Never Smoker   . Smokeless tobacco: Never Used  . Alcohol Use: Yes     Comment: 1 to 2 glasses of wine a week     Review of Systems  Constitutional: Positive for fatigue. Negative for unexpected weight change.  HENT:       Nasal congestion - night and with running - working with ENT for same prior to breast ca dx last mo  Respiratory: Negative for cough, shortness of breath and wheezing.   Cardiovascular: Negative for chest pain, palpitations and leg swelling.  Gastrointestinal: Negative for nausea, abdominal pain and diarrhea.  Neurological: Negative for dizziness, weakness, light-headedness and headaches.  Psychiatric/Behavioral: Positive for sleep disturbance. Negative for suicidal ideas, self-injury and dysphoric mood. The patient is not nervous/anxious (about breast ca dx, not other things).   All other systems reviewed and are negative.      Objective:    Physical Exam  Constitutional: She is oriented to person, place,  and time. She appears well-developed and well-nourished. No distress.  HENT:  Head: Normocephalic and atraumatic.  Right Ear: External ear normal.  Left Ear: External ear normal.  Nose: Nose normal.  Mouth/Throat: Oropharynx is clear and moist. No oropharyngeal exudate.  Eyes: EOM are normal. Pupils are equal, round, and reactive to light. Right eye exhibits no discharge. Left eye exhibits no discharge. No scleral icterus.  Neck: Normal range of motion. Neck supple. No JVD present. No tracheal deviation present. No thyromegaly present.  Cardiovascular: Normal rate, regular rhythm, normal heart sounds and intact distal pulses.  Exam reveals no friction rub.   No murmur heard. Pulmonary/Chest: Effort normal and breath sounds normal. No respiratory distress. She has no wheezes. She has no rales. She exhibits no tenderness.  Abdominal: Soft. Bowel sounds are normal. She exhibits no distension and no mass. There is no tenderness. There is no rebound and no guarding.  Genitourinary:  Defer to gyn  Musculoskeletal: Normal range of motion.  No gross deformities  Lymphadenopathy:    She has no cervical adenopathy.  Neurological: She is alert and oriented to person, place, and time. She has normal reflexes. No cranial nerve deficit.  Skin: Skin is warm and dry. No rash noted. She is not diaphoretic. No erythema.  Psychiatric: She has a normal mood and affect. Her behavior is normal. Judgment and  thought content normal.  Nursing note and vitals reviewed.   BP 122/70 mmHg  Pulse 60  Temp(Src) 97.6 F (36.4 C) (Oral)  Ht 5' 5" (1.651 m)  Wt 138 lb 6 oz (62.766 kg)  BMI 23.03 kg/m2  SpO2 98%  LMP 08/30/2015 Wt Readings from Last 3 Encounters:  11/07/15 138 lb 6 oz (62.766 kg)  10/25/15 140 lb (63.504 kg)  06/29/14 134 lb 2 oz (60.839 kg)     Lab Results  Component Value Date   WBC 4.6 10/25/2015   HGB 13.0 10/25/2015   HCT 39.5 10/25/2015   PLT 282 10/25/2015   GLUCOSE 134 10/25/2015    CHOL 220* 06/29/2014   TRIG 157.0* 06/29/2014   HDL 54.20 06/29/2014   LDLDIRECT 158.7 01/04/2013   LDLCALC 134* 06/29/2014   ALT 21 10/25/2015   AST 22 10/25/2015   NA 141 10/25/2015   K 4.0 10/25/2015   CL 103 06/29/2014   CREATININE 0.9 10/25/2015   BUN 13.0 10/25/2015   CO2 27 10/25/2015   TSH 0.88 06/29/2014    No results found.     Assessment & Plan:   CPX/z00.00 - Patient has been counseled on age-appropriate routine health concerns for screening and prevention. These are reviewed and up-to-date. Immunizations are up-to-date or declined. Labs ordered and reviewed.  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       Relevant Medications   levothyroxine (SYNTHROID, LEVOTHROID) 75 MCG tablet   Other Relevant Orders   TSH    Other Visit Diagnoses    Routine general medical examination at a health care facility    -  Primary    Relevant Orders    Lipid panel    TSH    Urinalysis, Routine w reflex microscopic (not at [facility])    VITAMIN D 25 Hydroxy (Vit-D Deficiency, Fractures)    Vitamin B12    Hepatitis C antibody    HIV antibody    Other fatigue        Relevant Orders    VITAMIN D 25 Hydroxy (Vit-D Deficiency, Fractures)    Vitamin B12    Need for hepatitis C screening test        Relevant Orders    Hepatitis C antibody    Screening for HIV without presence of risk factors         Relevant Orders    HIV antibody        Valerie Leschber, MD   

## 2015-11-07 NOTE — Progress Notes (Signed)
Pre visit review using our clinic review tool, if applicable. No additional management support is needed unless otherwise documented below in the visit note. 

## 2015-11-07 NOTE — Patient Instructions (Addendum)
It was good to see you today.  We have reviewed your prior records including labs and tests today  Health Maintenance reviewed - all recommended immunizations and age-appropriate screenings are up-to-date.  Test(s) ordered today. Your results will be released to [place] (or called to you) after review, usually within 72hours after test completion. If any changes need to be made, you will be notified at that same time.  Medications reviewed and updated, no changes recommended at this time. Refill on medication(s) as discussed today.  Please schedule followup in 12 months for annual exam and labs with new PCP Dr Quay Burow, call sooner if problems.  Health Maintenance, Female Adopting a healthy lifestyle and getting preventive care can go a long way to promote health and wellness. Talk with your health care provider about what schedule of regular examinations is right for you. This is a good chance for you to check in with your provider about disease prevention and staying healthy. In between checkups, there are plenty of things you can do on your own. Experts have done a lot of research about which lifestyle changes and preventive measures are most likely to keep you healthy. Ask your health care provider for more information. WEIGHT AND DIET  Eat a healthy diet  Be sure to include plenty of vegetables, fruits, low-fat dairy products, and lean protein.  Do not eat a lot of foods high in solid fats, added sugars, or salt.  Get regular exercise. This is one of the most important things you can do for your health.  Most adults should exercise for at least 150 minutes each week. The exercise should increase your heart rate and make you sweat (moderate-intensity exercise).  Most adults should also do strengthening exercises at least twice a week. This is in addition to the moderate-intensity exercise.  Maintain a healthy weight  Body mass index (BMI) is a measurement that can be used to identify  possible weight problems. It estimates body fat based on height and weight. Your health care provider can help determine your BMI and help you achieve or maintain a healthy weight.  For females 7 years of age and older:   A BMI below 18.5 is considered underweight.  A BMI of 18.5 to 24.9 is normal.  A BMI of 25 to 29.9 is considered overweight.  A BMI of 30 and above is considered obese.  Watch levels of cholesterol and blood lipids  You should start having your blood tested for lipids and cholesterol at 55 years of age, then have this test every 5 years.  You may need to have your cholesterol levels checked more often if:  Your lipid or cholesterol levels are high.  You are older than 55 years of age.  You are at high risk for heart disease.  CANCER SCREENING   Lung Cancer  Lung cancer screening is recommended for adults 26-22 years old who are at high risk for lung cancer because of a history of smoking.  A yearly low-dose CT scan of the lungs is recommended for people who:  Currently smoke.  Have quit within the past 15 years.  Have at least a 30-pack-year history of smoking. A pack year is smoking an average of one pack of cigarettes a day for 1 year.  Yearly screening should continue until it has been 15 years since you quit.  Yearly screening should stop if you develop a health problem that would prevent you from having lung cancer treatment.  Breast Cancer  Practice breast self-awareness. This means understanding how your breasts normally appear and feel.  It also means doing regular breast self-exams. Let your health care provider know about any changes, no matter how small.  If you are in your 20s or 30s, you should have a clinical breast exam (CBE) by a health care provider every 1-3 years as part of a regular health exam.  If you are 61 or older, have a CBE every year. Also consider having a breast X-ray (mammogram) every year.  If you have a family  history of breast cancer, talk to your health care provider about genetic screening.  If you are at high risk for breast cancer, talk to your health care provider about having an MRI and a mammogram every year.  Breast cancer gene (BRCA) assessment is recommended for women who have family members with BRCA-related cancers. BRCA-related cancers include:  Breast.  Ovarian.  Tubal.  Peritoneal cancers.  Results of the assessment will determine the need for genetic counseling and BRCA1 and BRCA2 testing. Cervical Cancer Your health care provider may recommend that you be screened regularly for cancer of the pelvic organs (ovaries, uterus, and vagina). This screening involves a pelvic examination, including checking for microscopic changes to the surface of your cervix (Pap test). You may be encouraged to have this screening done every 3 years, beginning at age 40.  For women ages 16-65, health care providers may recommend pelvic exams and Pap testing every 3 years, or they may recommend the Pap and pelvic exam, combined with testing for human papilloma virus (HPV), every 5 years. Some types of HPV increase your risk of cervical cancer. Testing for HPV may also be done on women of any age with unclear Pap test results.  Other health care providers may not recommend any screening for nonpregnant women who are considered low risk for pelvic cancer and who do not have symptoms. Ask your health care provider if a screening pelvic exam is right for you.  If you have had past treatment for cervical cancer or a condition that could lead to cancer, you need Pap tests and screening for cancer for at least 20 years after your treatment. If Pap tests have been discontinued, your risk factors (such as having a new sexual partner) need to be reassessed to determine if screening should resume. Some women have medical problems that increase the chance of getting cervical cancer. In these cases, your health care  provider may recommend more frequent screening and Pap tests. Colorectal Cancer  This type of cancer can be detected and often prevented.  Routine colorectal cancer screening usually begins at 54 years of age and continues through 55 years of age.  Your health care provider may recommend screening at an earlier age if you have risk factors for colon cancer.  Your health care provider may also recommend using home test kits to check for hidden blood in the stool.  A small camera at the end of a tube can be used to examine your colon directly (sigmoidoscopy or colonoscopy). This is done to check for the earliest forms of colorectal cancer.  Routine screening usually begins at age 59.  Direct examination of the colon should be repeated every 5-10 years through 55 years of age. However, you may need to be screened more often if early forms of precancerous polyps or small growths are found. Skin Cancer  Check your skin from head to toe regularly.  Tell your health care provider about any  new moles or changes in moles, especially if there is a change in a mole's shape or color.  Also tell your health care provider if you have a mole that is larger than the size of a pencil eraser.  Always use sunscreen. Apply sunscreen liberally and repeatedly throughout the day.  Protect yourself by wearing long sleeves, pants, a wide-brimmed hat, and sunglasses whenever you are outside. HEART DISEASE, DIABETES, AND HIGH BLOOD PRESSURE   High blood pressure causes heart disease and increases the risk of stroke. High blood pressure is more likely to develop in:  People who have blood pressure in the high end of the normal range (130-139/85-89 mm Hg).  People who are overweight or obese.  People who are African American.  If you are 31-11 years of age, have your blood pressure checked every 3-5 years. If you are 31 years of age or older, have your blood pressure checked every year. You should have your  blood pressure measured twice--once when you are at a hospital or clinic, and once when you are not at a hospital or clinic. Record the average of the two measurements. To check your blood pressure when you are not at a hospital or clinic, you can use:  An automated blood pressure machine at a pharmacy.  A home blood pressure monitor.  If you are between 30 years and 52 years old, ask your health care provider if you should take aspirin to prevent strokes.  Have regular diabetes screenings. This involves taking a blood sample to check your fasting blood sugar level.  If you are at a normal weight and have a low risk for diabetes, have this test once every three years after 55 years of age.  If you are overweight and have a high risk for diabetes, consider being tested at a younger age or more often. PREVENTING INFECTION  Hepatitis B  If you have a higher risk for hepatitis B, you should be screened for this virus. You are considered at high risk for hepatitis B if:  You were born in a country where hepatitis B is common. Ask your health care provider which countries are considered high risk.  Your parents were born in a high-risk country, and you have not been immunized against hepatitis B (hepatitis B vaccine).  You have HIV or AIDS.  You use needles to inject street drugs.  You live with someone who has hepatitis B.  You have had sex with someone who has hepatitis B.  You get hemodialysis treatment.  You take certain medicines for conditions, including cancer, organ transplantation, and autoimmune conditions. Hepatitis C  Blood testing is recommended for:  Everyone born from [birth year] through 1966.  Anyone with known risk factors for hepatitis C. Sexually transmitted infections (STIs)  You should be screened for sexually transmitted infections (STIs) including gonorrhea and chlamydia if:  You are sexually active and are younger than 55 years of age.  You are older than 55  years of age and your health care provider tells you that you are at risk for this type of infection.  Your sexual activity has changed since you were last screened and you are at an increased risk for chlamydia or gonorrhea. Ask your health care provider if you are at risk.  If you do not have HIV, but are at risk, it may be recommended that you take a prescription medicine daily to prevent HIV infection. This is called pre-exposure prophylaxis (PrEP). You are considered at risk  if:  You are sexually active and do not regularly use condoms or know the HIV status of your partner(s).  You take drugs by injection.  You are sexually active with a partner who has HIV. Talk with your health care provider about whether you are at high risk of being infected with HIV. If you choose to begin PrEP, you should first be tested for HIV. You should then be tested every 3 months for as long as you are taking PrEP.  PREGNANCY   If you are premenopausal and you may become pregnant, ask your health care provider about preconception counseling.  If you may become pregnant, take 400 to 800 micrograms (mcg) of folic acid every day.  If you want to prevent pregnancy, talk to your health care provider about birth control (contraception). OSTEOPOROSIS AND MENOPAUSE   Osteoporosis is a disease in which the bones lose minerals and strength with aging. This can result in serious bone fractures. Your risk for osteoporosis can be identified using a bone density scan.  If you are 65 years of age or older, or if you are at risk for osteoporosis and fractures, ask your health care provider if you should be screened.  Ask your health care provider whether you should take a calcium or vitamin D supplement to lower your risk for osteoporosis.  Menopause may have certain physical symptoms and risks.  Hormone replacement therapy may reduce some of these symptoms and risks. Talk to your health care provider about whether  hormone replacement therapy is right for you.  HOME CARE INSTRUCTIONS   Schedule regular health, dental, and eye exams.  Stay current with your immunizations.   Do not use any tobacco products including cigarettes, chewing tobacco, or electronic cigarettes.  If you are pregnant, do not drink alcohol.  If you are breastfeeding, limit how much and how often you drink alcohol.  Limit alcohol intake to no more than 1 drink per day for nonpregnant women. One drink equals 12 ounces of beer, 5 ounces of wine, or 1 ounces of hard liquor.  Do not use street drugs.  Do not share needles.  Ask your health care provider for help if you need support or information about quitting drugs.  Tell your health care provider if you often feel depressed.  Tell your health care provider if you have ever been abused or do not feel safe at home.   This information is not intended to replace advice given to you by your health care provider. Make sure you discuss any questions you have with your health care provider.   Document Released: 05/27/2011 Document Revised: 12/02/2014 Document Reviewed: 10/13/2013 Elsevier Interactive Patient Education 2016 [employer]  

## 2015-11-07 NOTE — Assessment & Plan Note (Signed)
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

## 2015-11-07 NOTE — Assessment & Plan Note (Signed)
Lab Results  Component Value Date   TSH 0.88 06/29/2014  check TSH today and adjust as needed The current medical regimen is effective;  continue present plan and medications.

## 2015-11-08 LAB — HEPATITIS C ANTIBODY: HCV Ab: NEGATIVE

## 2015-11-09 ENCOUNTER — Encounter (HOSPITAL_BASED_OUTPATIENT_CLINIC_OR_DEPARTMENT_OTHER): Payer: Self-pay | Admitting: *Deleted

## 2015-11-13 ENCOUNTER — Ambulatory Visit
Admission: RE | Admit: 2015-11-13 | Discharge: 2015-11-13 | Disposition: A | Discharge status: Home / Self Care | Payer: 59 | Source: Ambulatory Visit | Attending: Surgery | Admitting: Surgery

## 2015-11-13 DIAGNOSIS — C50911 Malignant neoplasm of unspecified site of right female breast: Secondary | ICD-10-CM

## 2015-11-16 ENCOUNTER — Encounter (HOSPITAL_BASED_OUTPATIENT_CLINIC_OR_DEPARTMENT_OTHER): Payer: Self-pay | Admitting: Anesthesiology

## 2015-11-16 ENCOUNTER — Ambulatory Visit
Admission: RE | Admit: 2015-11-16 | Discharge: 2015-11-16 | Disposition: A | Discharge status: Home / Self Care | Payer: 59 | Source: Ambulatory Visit | Attending: Surgery | Admitting: Surgery

## 2015-11-16 ENCOUNTER — Ambulatory Visit (HOSPITAL_BASED_OUTPATIENT_CLINIC_OR_DEPARTMENT_OTHER)
Admission: RE | Admit: 2015-11-16 | Discharge: 2015-11-16 | Disposition: A | Discharge status: Home / Self Care | Payer: 59 | Source: Ambulatory Visit | Attending: Surgery | Admitting: Surgery

## 2015-11-16 ENCOUNTER — Ambulatory Visit (HOSPITAL_BASED_OUTPATIENT_CLINIC_OR_DEPARTMENT_OTHER): Payer: 59 | Admitting: Anesthesiology

## 2015-11-16 ENCOUNTER — Ambulatory Visit (HOSPITAL_COMMUNITY): Payer: 59

## 2015-11-16 ENCOUNTER — Encounter (HOSPITAL_BASED_OUTPATIENT_CLINIC_OR_DEPARTMENT_OTHER)
Admission: RE | Disposition: A | Discharge status: Home / Self Care | Payer: Self-pay | Source: Ambulatory Visit | Attending: Surgery

## 2015-11-16 DIAGNOSIS — C50911 Malignant neoplasm of unspecified site of right female breast: Secondary | ICD-10-CM | POA: Insufficient documentation

## 2015-11-16 DIAGNOSIS — Z170 Estrogen receptor positive status [ER+]: Secondary | ICD-10-CM | POA: Diagnosis not present

## 2015-11-16 DIAGNOSIS — E7800 Pure hypercholesterolemia, unspecified: Secondary | ICD-10-CM | POA: Diagnosis not present

## 2015-11-16 DIAGNOSIS — E039 Hypothyroidism, unspecified: Secondary | ICD-10-CM | POA: Insufficient documentation

## 2015-11-16 DIAGNOSIS — F419 Anxiety disorder, unspecified: Secondary | ICD-10-CM | POA: Insufficient documentation

## 2015-11-16 HISTORY — PX: RADIOACTIVE SEED GUIDED PARTIAL MASTECTOMY WITH AXILLARY SENTINEL LYMPH NODE BIOPSY: SHX6520

## 2015-11-16 SURGERY — RADIOACTIVE SEED GUIDED PARTIAL MASTECTOMY WITH AXILLARY SENTINEL LYMPH NODE BIOPSY
Anesthesia: General | Site: Breast | Laterality: Right

## 2015-11-16 MED ORDER — FENTANYL CITRATE (PF) 100 MCG/2ML IJ SOLN
50.0000 ug | INTRAMUSCULAR | Status: DC | PRN
  Administered 2015-11-16: 100 ug via INTRAVENOUS

## 2015-11-16 MED ORDER — ONDANSETRON HCL 4 MG/2ML IJ SOLN: 4.0000 mg | Freq: Once | INTRAMUSCULAR | Status: DC | PRN

## 2015-11-16 MED ORDER — FENTANYL CITRATE (PF) 100 MCG/2ML IJ SOLN
INTRAMUSCULAR | Status: AC
  Filled 2015-11-16: qty 2

## 2015-11-16 MED ORDER — ONDANSETRON HCL 4 MG/2ML IJ SOLN
INTRAMUSCULAR | Status: AC
  Filled 2015-11-16: qty 2

## 2015-11-16 MED ORDER — OXYCODONE HCL 5 MG PO TABS
ORAL_TABLET | ORAL | Status: AC
  Filled 2015-11-16: qty 1

## 2015-11-16 MED ORDER — DEXAMETHASONE SODIUM PHOSPHATE 10 MG/ML IJ SOLN
INTRAMUSCULAR | Status: AC
  Filled 2015-11-16: qty 1

## 2015-11-16 MED ORDER — SCOPOLAMINE 1 MG/3DAYS TD PT72: 1.0000 | MEDICATED_PATCH | Freq: Once | TRANSDERMAL | Status: DC | PRN

## 2015-11-16 MED ORDER — BUPIVACAINE-EPINEPHRINE (PF) 0.5% -1:200000 IJ SOLN
INTRAMUSCULAR | Status: DC | PRN
  Administered 2015-11-16: 25 mL

## 2015-11-16 MED ORDER — MIDAZOLAM HCL 2 MG/2ML IJ SOLN
INTRAMUSCULAR | Status: AC
  Filled 2015-11-16: qty 2

## 2015-11-16 MED ORDER — BUPIVACAINE-EPINEPHRINE (PF) 0.25% -1:200000 IJ SOLN
INTRAMUSCULAR | Status: DC | PRN
  Administered 2015-11-16: 10 mL

## 2015-11-16 MED ORDER — CEFAZOLIN SODIUM-DEXTROSE 2-3 GM-% IV SOLR
INTRAVENOUS | Status: AC
  Filled 2015-11-16: qty 50

## 2015-11-16 MED ORDER — CEFAZOLIN SODIUM 10 G IJ SOLR
3.0000 g | INTRAMUSCULAR | Status: AC
  Administered 2015-11-16: 2 g via INTRAVENOUS

## 2015-11-16 MED ORDER — LACTATED RINGERS IV SOLN
INTRAVENOUS | Status: DC
  Administered 2015-11-16: 10:00:00 via INTRAVENOUS
  Administered 2015-11-16: 10 mL/h via INTRAVENOUS

## 2015-11-16 MED ORDER — OXYCODONE HCL 5 MG PO TABS
5.0000 mg | ORAL_TABLET | Freq: Once | ORAL | Status: AC
  Administered 2015-11-16: 5 mg via ORAL

## 2015-11-16 MED ORDER — GLYCOPYRROLATE 0.2 MG/ML IJ SOLN: 0.2000 mg | Freq: Once | INTRAMUSCULAR | Status: DC | PRN

## 2015-11-16 MED ORDER — CHLORHEXIDINE GLUCONATE 4 % EX LIQD: 1.0000 "application " | Freq: Once | CUTANEOUS | Status: DC

## 2015-11-16 MED ORDER — TECHNETIUM TC 99M SULFUR COLLOID FILTERED
1.0000 | Freq: Once | INTRAVENOUS | Status: AC | PRN
  Administered 2015-11-16: 1 via INTRADERMAL

## 2015-11-16 MED ORDER — DEXAMETHASONE SODIUM PHOSPHATE 4 MG/ML IJ SOLN
INTRAMUSCULAR | Status: DC | PRN
  Administered 2015-11-16: 10 mg via INTRAVENOUS

## 2015-11-16 MED ORDER — FENTANYL CITRATE (PF) 100 MCG/2ML IJ SOLN: 50.0000 ug | INTRAMUSCULAR | Status: DC | PRN

## 2015-11-16 MED ORDER — MIDAZOLAM HCL 5 MG/5ML IJ SOLN
INTRAMUSCULAR | Status: DC | PRN
  Administered 2015-11-16: 2 mg via INTRAVENOUS

## 2015-11-16 MED ORDER — PROPOFOL 10 MG/ML IV BOLUS
INTRAVENOUS | Status: DC | PRN
  Administered 2015-11-16: 200 mg via INTRAVENOUS

## 2015-11-16 MED ORDER — FENTANYL CITRATE (PF) 100 MCG/2ML IJ SOLN
25.0000 ug | INTRAMUSCULAR | Status: DC | PRN
  Administered 2015-11-16 (×2): 50 ug via INTRAVENOUS

## 2015-11-16 MED ORDER — FENTANYL CITRATE (PF) 100 MCG/2ML IJ SOLN
INTRAMUSCULAR | Status: DC | PRN
  Administered 2015-11-16 (×2): 50 ug via INTRAVENOUS

## 2015-11-16 MED ORDER — PROPOFOL 10 MG/ML IV BOLUS
INTRAVENOUS | Status: AC
  Filled 2015-11-16: qty 40

## 2015-11-16 MED ORDER — LIDOCAINE HCL (CARDIAC) 20 MG/ML IV SOLN
INTRAVENOUS | Status: AC
  Filled 2015-11-16: qty 5

## 2015-11-16 MED ORDER — OXYCODONE-ACETAMINOPHEN 5-325 MG PO TABS: 1.0000 | ORAL_TABLET | ORAL | Status: DC | PRN

## 2015-11-16 MED ORDER — MIDAZOLAM HCL 2 MG/2ML IJ SOLN
1.0000 mg | INTRAMUSCULAR | Status: DC | PRN
  Administered 2015-11-16: 2 mg via INTRAVENOUS

## 2015-11-16 MED ORDER — LIDOCAINE HCL (CARDIAC) 20 MG/ML IV SOLN
INTRAVENOUS | Status: DC | PRN
  Administered 2015-11-16: 50 mg via INTRAVENOUS

## 2015-11-16 SURGICAL SUPPLY — 50 items
APPLIER CLIP 9.375 MED OPEN (MISCELLANEOUS) ×2
APR CLP MED 9.3 20 MLT OPN (MISCELLANEOUS) ×1
BINDER BREAST LRG (GAUZE/BANDAGES/DRESSINGS) ×2 IMPLANT
BINDER BREAST MEDIUM (GAUZE/BANDAGES/DRESSINGS) IMPLANT
BLADE SURG 15 STRL LF DISP TIS (BLADE) ×1 IMPLANT
BLADE SURG 15 STRL SS (BLADE) ×1
CANISTER SUC SOCK COL 7IN (MISCELLANEOUS) IMPLANT
CANISTER SUCT 1200ML W/VALVE (MISCELLANEOUS) IMPLANT
CHLORAPREP W/TINT 26ML (MISCELLANEOUS) ×2 IMPLANT
CLIP APPLIE 9.375 MED OPEN (MISCELLANEOUS) ×1 IMPLANT
COVER BACK TABLE 60X90IN (DRAPES) ×2 IMPLANT
COVER MAYO STAND STRL (DRAPES) ×2 IMPLANT
COVER PROBE W GEL 5X96 (DRAPES) ×4 IMPLANT
COVER SURGICAL LIGHT HANDLE (MISCELLANEOUS) ×2 IMPLANT
DECANTER SPIKE VIAL GLASS SM (MISCELLANEOUS) IMPLANT
DEVICE DUBIN W/COMP PLATE 8390 (MISCELLANEOUS) ×2 IMPLANT
DRAPE LAPAROSCOPIC ABDOMINAL (DRAPES) IMPLANT
DRAPE LAPAROTOMY 100X72 PEDS (DRAPES) ×2 IMPLANT
DRAPE UTILITY XL STRL (DRAPES) ×2 IMPLANT
ELECT COATED BLADE 2.86 ST (ELECTRODE) ×2 IMPLANT
ELECT REM PT RETURN 9FT ADLT (ELECTROSURGICAL) ×2
ELECTRODE REM PT RTRN 9FT ADLT (ELECTROSURGICAL) ×1 IMPLANT
GLOVE BIO SURGEON STRL SZ 6.5 (GLOVE) ×2 IMPLANT
GLOVE BIOGEL PI IND STRL 7.0 (GLOVE) ×2 IMPLANT
GLOVE BIOGEL PI IND STRL 8 (GLOVE) ×2 IMPLANT
GLOVE BIOGEL PI INDICATOR 7.0 (GLOVE) ×2
GLOVE BIOGEL PI INDICATOR 8 (GLOVE) ×2
GLOVE ECLIPSE 8.0 STRL XLNG CF (GLOVE) ×2 IMPLANT
GLOVE SURG SS PI 7.5 STRL IVOR (GLOVE) ×2 IMPLANT
GOWN STRL REUS W/ TWL LRG LVL3 (GOWN DISPOSABLE) ×2 IMPLANT
GOWN STRL REUS W/ TWL XL LVL3 (GOWN DISPOSABLE) ×1 IMPLANT
GOWN STRL REUS W/TWL LRG LVL3 (GOWN DISPOSABLE) ×4
GOWN STRL REUS W/TWL XL LVL3 (GOWN DISPOSABLE) ×2
HEMOSTAT SNOW SURGICEL 2X4 (HEMOSTASIS) IMPLANT
KIT MARKER MARGIN INK (KITS) ×2 IMPLANT
LIQUID BAND (GAUZE/BANDAGES/DRESSINGS) ×2 IMPLANT
NEEDLE HYPO 25X1 1.5 SAFETY (NEEDLE) ×2 IMPLANT
NS IRRIG 1000ML POUR BTL (IV SOLUTION) ×2 IMPLANT
PACK BASIN DAY SURGERY FS (CUSTOM PROCEDURE TRAY) ×2 IMPLANT
PENCIL BUTTON HOLSTER BLD 10FT (ELECTRODE) ×2 IMPLANT
SLEEVE SCD COMPRESS KNEE MED (MISCELLANEOUS) ×2 IMPLANT
SPONGE LAP 4X18 X RAY DECT (DISPOSABLE) ×4 IMPLANT
SUT MNCRL AB 4-0 PS2 18 (SUTURE) ×2 IMPLANT
SUT SILK 2 0 SH (SUTURE) IMPLANT
SUT VICRYL 3-0 CR8 SH (SUTURE) ×2 IMPLANT
SYR CONTROL 10ML LL (SYRINGE) ×2 IMPLANT
TOWEL OR 17X24 6PK STRL BLUE (TOWEL DISPOSABLE) ×2 IMPLANT
TOWEL OR NON WOVEN STRL DISP B (DISPOSABLE) ×2 IMPLANT
TUBE CONNECTING 20X1/4 (TUBING) IMPLANT
YANKAUER SUCT BULB TIP NO VENT (SUCTIONS) IMPLANT

## 2015-11-16 NOTE — Transfer of Care (Signed)
Immediate Anesthesia Transfer of Care Note  Patient: Jeanne Parker  Procedure(s) Performed: Procedure(s): RADIOACTIVE SEED GUIDED PARTIAL MASTECTOMY WITH AXILLARY SENTINEL LYMPH NODE BIOPSY (Right)  Patient Location: PACU  Anesthesia Type:GA combined with regional for post-op pain  Level of Consciousness: sedated  Airway & Oxygen Therapy: Patient Spontanous Breathing and Patient connected to face mask oxygen  Post-op Assessment: Report given to RN and Post -op Vital signs reviewed and stable  Post vital signs: Reviewed and stable  Last Vitals:  Filed Vitals:   11/16/15 0910 11/16/15 1021  BP: 116/67   Pulse: 74 63  Temp:    Resp: 16 15    Complications: No apparent anesthesia complications

## 2015-11-16 NOTE — Anesthesia Procedure Notes (Addendum)
Anesthesia Regional Block:  Pectoralis block  Pre-Anesthetic Checklist: ,, timeout performed, Correct Patient, Correct Site, Correct Laterality, Correct Procedure, Correct Position, site marked, Risks and benefits discussed,  Surgical consent,  Pre-op evaluation,  At surgeon's request and post-op pain management  Laterality: Right  Prep: chloraprep       Needles:  Injection technique: Single-shot  Needle Type: Stimiplex      Needle Gauge: 21 and 21 G    Additional Needles:  Procedures: ultrasound guided (picture in chart) Pectoralis block Narrative:  Injection made incrementally with aspirations every 5 mL.  Performed by: Personally  Anesthesiologist: Lauretta Grill  Additional Notes: Patient tolerated the procedure well without complications   Procedure Name: LMA Insertion Date/Time: 11/16/2015 9:29 AM Performed by: Toula Moos L Pre-anesthesia Checklist: Patient identified, Emergency Drugs available, Suction available, Patient being monitored and Timeout performed Patient Re-evaluated:Patient Re-evaluated prior to inductionOxygen Delivery Method: Circle System Utilized Preoxygenation: Pre-oxygenation with 100% oxygen Intubation Type: IV induction Ventilation: Mask ventilation without difficulty LMA: LMA inserted LMA Size: 4.0 Number of attempts: 1 Airway Equipment and Method: Bite block Placement Confirmation: positive ETCO2 Tube secured with: Tape Dental Injury: Teeth and Oropharynx as per pre-operative assessment

## 2015-11-16 NOTE — Interval H&P Note (Signed)
History and Physical Interval Note:  11/16/2015 8:35 AM  Jeanne Parker  has presented today for surgery, with the diagnosis of RIGHT BREAST CANCER  The various methods of treatment have been discussed with the patient and family. After consideration of risks, benefits and other options for treatment, the patient has consented to  Procedure(s): BREAST LUMPECTOMY WITH RADIOACTIVE SEED LOCALIZATION (Right) as a surgical intervention .  The patient's history has been reviewed, patient examined, no change in status, stable for surgery.  I have reviewed the patient's chart and labs.  Questions were answered to the patient's satisfaction.    Pt will need SLN mapping as well. Sentinel lymph node mapping and dissection has been discussed with the patient.  Risk of bleeding,  Infection,  Seroma formation,  Additional procedures,,  Shoulder weakness ,  Shoulder stiffness,  Nerve and blood vessel injury and reaction to the mapping dyes have been discussed.  Alternatives to surgery have been discussed with the patient.  The patient agrees to proceed.   Shenandoah Yeats A.

## 2015-11-16 NOTE — Anesthesia Preprocedure Evaluation (Addendum)
Anesthesia Evaluation  Patient identified by MRN, date of birth, ID band Patient awake    Reviewed: Allergy & Precautions, NPO status , Patient's Chart, lab work & pertinent test results  History of Anesthesia Complications Negative for: history of anesthetic complications  Airway Mallampati: II  TM Distance: >3 FB Neck ROM: Full    Dental no notable dental hx. (+) Dental Advisory Given   Pulmonary neg pulmonary ROS,    Pulmonary exam normal breath sounds clear to auscultation       Cardiovascular negative cardio ROS Normal cardiovascular exam Rhythm:Regular Rate:Normal     Neuro/Psych PSYCHIATRIC DISORDERS Anxiety negative neurological ROS     GI/Hepatic negative GI ROS, Neg liver ROS,   Endo/Other  Hypothyroidism   Renal/GU negative Renal ROS  negative genitourinary   Musculoskeletal negative musculoskeletal ROS (+)   Abdominal   Peds negative pediatric ROS (+)  Hematology negative hematology ROS (+)   Anesthesia Other Findings Breast ca  Reproductive/Obstetrics negative OB ROS                            Anesthesia Physical Anesthesia Plan  ASA: II  Anesthesia Plan: General   Post-op Pain Management:    Induction: Intravenous  Airway Management Planned: LMA  Additional Equipment:   Intra-op Plan:   Post-operative Plan: Extubation in OR  Informed Consent: I have reviewed the patients History and Physical, chart, labs and discussed the procedure including the risks, benefits and alternatives for the proposed anesthesia with the patient or authorized representative who has indicated his/her understanding and acceptance.   Dental advisory given  Plan Discussed with: CRNA  Anesthesia Plan Comments:         Anesthesia Quick Evaluation

## 2015-11-16 NOTE — H&P (View-Only) (Signed)
Jeanne Parker 10/25/2015 7:54 AM Location: [facility] Patient #: [id number] DOB: 1961-05-03 Undefined / Language: Cleophus Molt / Race: White Female  History of Present Illness Marcello Moores A. Jaelyne Deeg MD; 10/25/2015 4:12 PM) Patient words: right breast cancer  Patient presents at the request of Dr. Isidore Moos for right breast cancer. Patient went for screening mammogram a 1.3 cm mass in the right upper quadrant was identified. Biopsy doneER PR positive HER-2/neu negative.ER positive PR positive HER-2/neu negative. Patient denies symptoms of breast pain, nipple discharge or mass..        Patient: Jeanne Parker, Jeanne Parker Collected: 10/11/2015 Client: [facility] Accession: [id number] Received: 10/11/2015 Trudee Kuster, MD DOB: Jul 26, 1961 Age: 54 Gender: F Reported: 10/13/2015 [address], [place] [address] Patient Ph: [phone] MRN #: [id number] [place], [place] [address] Client Acc#: Chart #: [id number] Phone: Fax: CC: [facility] INTERNAL CC Gwendolyn Grant CC: Marylynn Pearson REPORT OF SURGICAL PATHOLOGY ADDITIONAL INFORMATION: PROGNOSTIC INDICATORS Results: IMMUNOHISTOCHEMICAL AND MORPHOMETRIC ANALYSIS PERFORMED MANUALLY Estrogen Receptor: 95%, POSITIVE, STRONG STAINING INTENSITY Progesterone Receptor: 80%, POSITIVE, STRONG STAINING INTENSITY Proliferation Marker Ki67: 3% REFERENCE RANGE ESTROGEN RECEPTOR NEGATIVE 0% POSITIVE =>1% REFERENCE RANGE PROGESTERONE RECEPTOR NEGATIVE 0% POSITIVE =>1% All controls stained appropriately Enid Cutter MD Pathologist, Electronic Signature ( Signed 10/17/2015) FLUORESCENCE IN-SITU HYBRIDIZATION Results: HER2 - NEGATIVE RATIO OF HER2/CEP17 SIGNALS 1.15 AVERAGE HER2 COPY NUMBER PER CELL 1.95 Reference Range: NEGATIVE HER2/CEP17 Ratio <2.0 and average HER2 copy number <4.0 EQUIVOCAL HER2/CEP17 Ratio <2.0 and average HER2 copy number >=4.0 and <6.0 1 of 3 FINAL for Jeanne Parker, Jeanne Parker  ([id number]) ADDITIONAL INFORMATION:(continued) POSITIVE HER2/CEP17 Ratio >=2.0 or <2.0 and average HER2 copy number >=6.0 Enid Cutter MD Pathologist, Electronic Signature ( Signed 10/17/2015) FINAL DIAGNOSIS Diagnosis Breast, right, needle core biopsy, 1 o'clock - 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 - SEE COMMENT. Microscopic Comment An E-Cadherin immunohistochemical stain is performed after review of the H&E stained sections. The morphology coupled with the staining pattern is consistent with the above diagnosis. Although definitive grading of breast carcinoma is best done on excision, the features of the invasive tumor from the right 1 o'clock biopsy are compatible with a grade I breast carcinoma. Breast prognostic markers will be performed and reported in an addendum. Findings are called to the [facility] on 10/13/2015. Dr. Donato Heinz has seen this case in consultation with agreement. (RH:kh 10/13/15) Willeen Niece MD Pathologist, Electronic Signature (Case signed 10/13/2015) Specimen Gross and Clinical Information Specimen Comment In formalin 10:40; suspicious right breast mass Specimen(s) Obtained: Breast, right, needle core biopsy, 1 o'clock Specimen Clinical Information Suspect IDC Gross Received in formalin are 4 cores of soft, tan-red tissue which measure 0.6 x 0.3 x 0.2 cm and up to 2.0 x 0.3 x 0.2 cm. Time in Formalin 10:40 am.Submitted in toto in 1 block(s) Stain(s) used in Diagnosis: The following stain(s) were used in diagnosing the case: Her2 FISH, PR-ACIS, KI-67-ACIS, ER-ACIS, E-CAD. The control(s) stained appropriately. 2 of 3 FINAL for Jeanne Parker, Jeanne Parker ([id number]) Disclaimer HER2 IQFISH pharmDX (code [phone]) is a direct fluorescence in-situ hybridization assay designed to quantitatively determine HER2 gene amplification in formalin-fixed, paraffin-embedded tissue specimens. It is performed at  [facility] and is reported using ASCO/CAP scoring criteria published in 2013. Some of these immunohistochemical stains may have been developed and the performance characteristics determined by [facility]. Some may not have been cleared or approved by the U.S. Food and Drug Administration. The FDA has  determined that such clearance or approval is not necessary. This test is used for clinical purposes. It should not be regarded as investigational or for research. This laboratory is certified under the [place] (CLIA-88) as qualified to perform high complexity clinical laboratory testing. PR progesterone receptor (PgR 636), immunohistochemical stains are performed on formalin fixed, paraffin embedded tissue using a 3,3"-diaminobenzidine (DAB) chromogen and DAKO Autostainer System. The staining intensity of the nucleus is scored morphometrically using the Automated Cellular Imaging System (ACIS) and is reported as the percentage of tumor cell nuclei demonstrating specific nuclear staining. Ki-67 (Mib-1), immunohistochemical stains are performed on formalin fixed, paraffin embedded tissue using a 3,3"-diaminobenzidine (DAB) chromogen and Marcus. The staining intensity of the nucleus is scored morphometrically using the Automated Cellular Imaging System (ACIS) and is reported as the percentage of tumor cell nuclei demonstrating specific nuclear staining. Estrogen receptor (SP1), immunohistochemical stains are performed on formalin fixed, paraffin embedded tissue using a 3,3"-diaminobenzidine (DAB) chromogen and DAKO Autostainer System. The staining intensity of the nucleus is scored morphometrically using the Automated Cellular Imaging System (ACIS) and is reported as the percentage of tumor cell nuclei demonstrating specific nuclear staining. Report signed out from the following location(s) Technical Component was performed  at [facility]. [address],[place] [address].[id number]., Technical Component was performed at [facility] [place], [place], [place] [address]. CLIA #: [id number], Technical component and interpretation was performed at [place], [place], [place] [address]. CLIA #: [id number], 3 of           CONSULTATION: Patient Name: Jeanne Parker, Jeanne Parker Patient DOB: Oct 07, 1961 Age: 54 y/o MRN: [id number] Patient's Current Pain Level: 0 HPI: The patient returns for a followup after ultrasound-guided biopsy of a suspicious 1.3 cm mass in the upper inner right breast at the 1 o'clock position. The patient is doing well and denies any biopsy site complications. 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 This is concordant with the imaging findings. Breast prognostic markers are pending. Biopsy site: Biopsy site is healed over. There mild ecchymosis at the biopsy site. PLAN: All the patient's questions were answered. She and her husband are aware of the results and demonstrates understanding. The patient is scheduled to to be seen in the breast multidisciplinary cancer clinic 10/25/2015. Electronically Signed By: Everlean Alstrom M.D. On: 10/13/2015 14:46 .  The patient is a 54 year old female.   Other Problems Conni Slipper, RN; 10/25/2015 7:54 AM) Anxiety Disorder Breast Cancer Hypercholesterolemia Lump In Breast Thyroid Disease  Past Surgical History Conni Slipper, RN; 10/25/2015 7:54 AM) Breast Biopsy Bilateral. Colon Polyp Removal - Colonoscopy  Diagnostic Studies History Conni Slipper, RN; 10/25/2015 7:54 AM) Colonoscopy within last year Mammogram within last year Pap Smear 1-5 years ago  Medication History Conni Slipper, RN; 10/25/2015 7:54 AM) No Current  Medications Medications Reconciled  Social History Conni Slipper, RN; 10/25/2015 7:54 AM) Alcohol use Occasional alcohol use. Caffeine use Carbonated beverages, Coffee, Tea. Tobacco use Never smoker.  Family History Conni Slipper, RN; 10/25/2015 7:54 AM) Arthritis Father. Colon Cancer Family Members In General. Colon Polyps Family Members In General. Diabetes Mellitus Father. Heart Disease Father. Hypertension Father.  Pregnancy / Birth History Conni Slipper, RN; 10/25/2015 7:54 AM) Age at menarche 75 years. Age of menopause 51-55 Contraceptive History Oral contraceptives. Gravida 5 Irregular periods Maternal age 56-30 Para 81  Review of Systems Conni Slipper RN; 10/25/2015 7:54 AM) General Not Present- Appetite Loss, Chills, Fatigue, Fever, Night Sweats, Weight Gain and Weight Loss. Skin Not Present- Change in Wart/Mole, Dryness, Hives, Jaundice, New Lesions, Non-Healing Wounds, Rash and Ulcer. HEENT Present- Nose Bleed, Seasonal Allergies, Sinus Pain and Wears glasses/contact lenses. Not Present- Earache, Hearing Loss, Hoarseness, Oral Ulcers, Ringing in the Ears, Sore Throat, Visual Disturbances and Yellow Eyes. Respiratory Present- Snoring. Not Present- Bloody sputum, Chronic Cough, Difficulty Breathing and Wheezing. Breast Not Present- Breast Mass, Breast Pain, Nipple Discharge and Skin Changes. Cardiovascular Not Present- Chest Pain, Difficulty Breathing Lying Down, Leg Cramps, Palpitations, Rapid Heart Rate, Shortness of Breath and Swelling of Extremities. Gastrointestinal Not Present- Abdominal Pain, Bloating, Bloody Stool, Change in Bowel Habits, Chronic diarrhea, Constipation, Difficulty Swallowing, Excessive gas, Gets full quickly at meals, Hemorrhoids, Indigestion, Nausea, Rectal Pain and Vomiting. Female Genitourinary Not Present- Frequency, Nocturia, Painful Urination, Pelvic Pain and Urgency. Musculoskeletal Not Present- Back Pain, Joint Pain, Joint  Stiffness, Muscle Pain, Muscle Weakness and Swelling of Extremities. Neurological Not Present- Decreased Memory, Fainting, Headaches, Numbness, Seizures, Tingling, Tremor, Trouble walking and Weakness. Psychiatric Present- Anxiety. Not Present- Bipolar, Change in Sleep Pattern, Depression, Fearful and Frequent crying. Endocrine Not Present- Cold Intolerance, Excessive Hunger, Hair Changes, Heat Intolerance, Hot flashes and New Diabetes. Hematology Not Present- Easy Bruising, Excessive bleeding, Gland problems, HIV and Persistent Infections.   Physical Exam (Reyah Streeter A. Adryana Mogensen MD; 10/25/2015 4:13 PM)  General Mental Status-Alert. General Appearance-Consistent with stated age. Hydration-Well hydrated. Voice-Normal.  Head and Neck Head-normocephalic, atraumatic with no lesions or palpable masses. Trachea-midline. Thyroid Gland Characteristics - normal size and consistency.  Eye Eyeball - Bilateral-Extraocular movements intact. Sclera/Conjunctiva - Bilateral-No scleral icterus.  Chest and Lung Exam Chest and lung exam reveals -quiet, even and easy respiratory effort with no use of accessory muscles and on auscultation, normal breath sounds, no adventitious sounds and normal vocal resonance. Inspection Chest Wall - Normal. Back - normal.  Breast Breast - Left-Symmetric, Non Tender, No Biopsy scars, no Dimpling, No Inflammation, No Lumpectomy scars, No Mastectomy scars, No Peau d' Orange. Breast - Right-Symmetric, Non Tender, No Biopsy scars, no Dimpling, No Inflammation, No Lumpectomy scars, No Mastectomy scars, No Peau d' Orange. Breast Lump-No Palpable Breast Mass. Note: LIGHT BRUISING RIGHT BREAST.  Cardiovascular Cardiovascular examination reveals -normal heart sounds, regular rate and rhythm with no murmurs and normal pedal pulses bilaterally.  Abdomen Inspection Inspection of the abdomen reveals - No Hernias. Skin - Scar - no surgical  scars. Palpation/Percussion Palpation and Percussion of the abdomen reveal - Soft, Non Tender, No Rebound tenderness, No Rigidity (guarding) and No hepatosplenomegaly. Auscultation Auscultation of the abdomen reveals - Bowel sounds normal.  Neurologic Neurologic evaluation reveals -alert and oriented x 3 with no impairment of recent or remote memory. Mental Status-Normal.  Musculoskeletal Normal Exam - Left-Upper Extremity Strength Normal and Lower Extremity Strength Normal. Normal Exam - Right-Upper Extremity Strength Normal and Lower Extremity Strength Normal.  Lymphatic Head & Neck  General Head & Neck Lymphatics: Bilateral - Description - Normal. Axillary  General Axillary Region: Bilateral - Description - Normal. Tenderness - Non Tender. Femoral & Inguinal  Generalized Femoral & Inguinal Lymphatics: Bilateral - Description - Normal. Tenderness - Non Tender.    Assessment & Plan (Ortencia Askari A. Greydon Betke MD; 10/25/2015 4:13 PM)  BREAST CANCER, RIGHT (C50.911) Impression: DISCUCONSTRUCTIEL LYMPH NODE MAPPING.REAST LUMPECTOMY WITH SENTINEL LYMPH NODE MAPPING.REAST LUMPECTOMY WITH SENTINEL LYMPH NODE MAPPING. sHE DESIRES BREAST CONSERVATION. Risk of lumpectomy include  bleeding, infection, seroma, more surgery, use of seed/wire, wound care, cosmetic deformity and the need for other treatments, death , blood clots, death. Pt agrees to proceed. Risk of sentinel lymph node mapping include bleeding, infection, lymphedema, shoulder pain. stiffness, dye allergy. cosmetic deformity , blood clots, death, need for more surgery. Pt agres to proceed.  Current Plans You are being scheduled for surgery - Our schedulers will call you.  You should hear from our office's scheduling department within 5 working days about the location, date, and time of surgery. We try to make accommodations for patient's preferences in scheduling surgery, but sometimes the OR schedule or the surgeon's schedule  prevents [place] from making those accommodations.  If you have not heard from our office [phone]) in 5 working days, call the office and ask for your surgeon's nurse.  If you have other questions about your diagnosis, plan, or surgery, call the office and ask for your surgeon's nurse.  Pt Education - [facility] Breast Cancer Information Given - [facility] "Breast Journey" Package We discussed the staging and pathophysiology of breast cancer. We discussed all of the different options for treatment for breast cancer including surgery, chemotherapy, radiation therapy, Herceptin, and antiestrogen therapy. We discussed a sentinel lymph node biopsy as she does not appear to having lymph node involvement right now. We discussed the performance of that with injection of radioactive tracer and blue dye. We discussed that she would have an incision underneath her axillary hairline. We discussed that there is a bout a 10-20% chance of having a positive node with a sentinel lymph node biopsy and we will await the permanent pathology to make any other first further decisions in terms of her treatment. One of these options might be to return to the operating room to perform an axillary lymph node dissection. We discussed about a 1-2% risk lifetime of chronic shoulder pain as well as lymphedema associated with a sentinel lymph node biopsy. We discussed the options for treatment of the breast cancer which included lumpectomy versus a mastectomy. We discussed the performance of the lumpectomy with a wire placement. We discussed a 10-20% chance of a positive margin requiring reexcision in the operating room. We also discussed that she may need radiation therapy or antiestrogen therapy or both if she undergoes lumpectomy. We discussed the mastectomy and the postoperative care for that as well. We discussed that there is no difference in her survival whether she undergoes lumpectomy with radiation therapy or antiestrogen therapy versus  a mastectomy. There is a slight difference in the local recurrence rate being 3-5% with lumpectomy and about 1% with a mastectomy. We discussed the risks of operation including bleeding, infection, possible reoperation. She understands her further therapy will be based on what her stages at the time of her operation.  Pt Education - [facility] breast cancer surgery: discussed with patient and provided information. Pt Education - [facility] Breast Biopsy HCI: discussed with patient and provided information. Pt Education - ABC (After Breast Cancer) Class Info: discussed with patient and provided information.

## 2015-11-16 NOTE — Anesthesia Postprocedure Evaluation (Signed)
Anesthesia Post Note  Patient: Jeanne Parker  Procedure(s) Performed: Procedure(s) (LRB): RADIOACTIVE SEED GUIDED PARTIAL MASTECTOMY WITH AXILLARY SENTINEL LYMPH NODE BIOPSY (Right)  Patient location during evaluation: PACU Anesthesia Type: General Level of consciousness: awake and alert Pain management: pain level controlled Vital Signs Assessment: post-procedure vital signs reviewed and stable Respiratory status: spontaneous breathing, nonlabored ventilation, respiratory function stable and patient connected to nasal cannula oxygen Cardiovascular status: blood pressure returned to baseline and stable Postop Assessment: no signs of nausea or vomiting Anesthetic complications: no    Last Vitals:  Filed Vitals:   11/16/15 1100 11/16/15 1150  BP: 132/84 133/84  Pulse: 82 76  Temp:  37 C  Resp: 13 16    Last Pain:  Filed Vitals:   11/16/15 1159  PainSc: 3                  [name]

## 2015-11-16 NOTE — Op Note (Signed)
Preoperative diagnosis: Breast cancer right   Postop diagnosis: Same  Procedure:  Right Partial mastectomy with seed localization  And right sentinal lymph node mapping   Surgeon: Erroll Luna M.D.  Anesthesia: LMA with 0.25% Sensorcaine local with epinephrine and pectoral  block  EBL: Less than 40 cc  Specimen:  Right Breast mass with seed  and clip verified by radiography to pathology and 3 SLN to pathology   Drains: None  Indications for procedure: The patient presents with a  Right breast mass. Core biopsy showed it to be consistent with right  breast cancer. Options of mastectomy versus breast conservation were discussed. The patient was to proceed with partial mastectomy with wire localization.  Description of procedure: The patient was seen in the holding area and the appropriate side was marked as right . Questions are answered. Seed  was done the radiology. Neoprobe used to verify the location of the seed on the right.  Nuclear medicine performed  Injection of the right breast for mapping and pectoral block placed.  The patient was taken back to the operating room and placed supine on the operating room table. After induction of general anesthesia, chest and upper arm  were prepped and draped in a sterile fashion. Timeout was done and she received preoperative antibiotics. Curvilinear incision was made around the seed  insertion site in the upper right breast . All tissue around the seed  was excised and hemostasis was achieved with cautery. The area was removed in its entirety upon gross examination. Gross margin negative. Radiograph revealed the mass, seed  and clip to be in the specimen. The wound was closed in layers using 3-0 Vicryl and 4-0 Monocryl subcuticular stitch. Liquid adhesive applied. All final counts found to be correct.   Ina similar fashion, the neoprobe was used and hot spot identified in the right axillary region.   2 cm incision made in the right axilla and 3 SLN  were removed and sent to pathology.  Background counts approached zero. The thoracodorsal and long thoracic nerves were protected,  The axillary vein was protected.  Medial pectoral nerve preserved.  Wound closed in 2 layers with 3 O vicryl and 4 0 monocryl.  Liquid adhesive applied/  Binder placed.  Ptr awoke  And extubated taken to recover in satisfactory condition. All counts correct.

## 2015-11-16 NOTE — Discharge Instructions (Signed)
[facility] °Office Phone Number [phone] ° °BREAST BIOPSY/ PARTIAL MASTECTOMY: POST OP INSTRUCTIONS ° °Always review your discharge instruction sheet given to you by the facility where your surgery was performed. ° °IF YOU HAVE DISABILITY OR FAMILY LEAVE FORMS, YOU MUST BRING THEM TO THE OFFICE FOR PROCESSING.  DO NOT GIVE THEM TO YOUR DOCTOR. ° °1. A prescription for pain medication may be given to you upon discharge.  Take your pain medication as prescribed, if needed.  If narcotic pain medicine is not needed, then you may take acetaminophen (Tylenol) or ibuprofen (Advil) as needed. °2. Take your usually prescribed medications unless otherwise directed °3. If you need a refill on your pain medication, please contact your pharmacy.  They will contact our office to request authorization.  Prescriptions will not be filled after 5pm or on week-ends. °4. You should eat very light the first 24 hours after surgery, such as soup, crackers, pudding, etc.  Resume your normal diet the day after surgery. °5. Most patients will experience some swelling and bruising in the breast.  Ice packs and a good support bra will help.  Swelling and bruising can take several days to resolve.  °6. It is common to experience some constipation if taking pain medication after surgery.  Increasing fluid intake and taking a stool softener will usually help or prevent this problem from occurring.  A mild laxative (Milk of Magnesia or Miralax) should be taken according to package directions if there are no bowel movements after 48 hours. °7. Unless discharge instructions indicate otherwise, you may remove your bandages 24-48 hours after surgery, and you may shower at that time.  You may have steri-strips (small skin tapes) in place directly over the incision.  These strips should be left on the skin for 7-10 days.  If your surgeon used skin glue on the incision, you may shower in 24 hours.  The glue will flake off over the  next 2-3 weeks.  Any sutures or staples will be removed at the office during your follow-up visit. °8. ACTIVITIES:  You may resume regular daily activities (gradually increasing) beginning the next day.  Wearing a good support bra or sports bra minimizes pain and swelling.  You may have sexual intercourse when it is comfortable. °a. You may drive when you no longer are taking prescription pain medication, you can comfortably wear a seatbelt, and you can safely maneuver your car and apply brakes. °b. RETURN TO WORK:  ______________________________________________________________________________________ °9. You should see your doctor in the office for a follow-up appointment approximately two weeks after your surgery.  Your doctor’s nurse will typically make your follow-up appointment when she calls you with your pathology report.  Expect your pathology report 2-3 business days after your surgery.  You may call to check if you do not hear from us after three days. °10. OTHER INSTRUCTIONS: _______________________________________________________________________________________________ _____________________________________________________________________________________________________________________________________ °_____________________________________________________________________________________________________________________________________ °_____________________________________________________________________________________________________________________________________ ° °WHEN TO CALL YOUR DOCTOR: °1. Fever over 101.0 °2. Nausea and/or vomiting. °3. Extreme swelling or bruising. °4. Continued bleeding from incision. °5. Increased pain, redness, or drainage from the incision. ° °The clinic staff is available to answer your questions during regular business hours.  Please don’t hesitate to call and ask to speak to one of the nurses for clinical concerns.  If you have a medical emergency, go to the nearest  emergency room or call 911.  A surgeon from [facility] is always on call at the hospital. ° °For further questions, please visit [email or web address]  ° ° ° °  Post Anesthesia Home Care Instructions ° °Activity: °Get plenty of rest for the remainder of the day. A responsible adult should stay with you for 24 hours following the procedure.  °For the next 24 hours, DO NOT: °-Drive a car °-Operate machinery °-Drink alcoholic beverages °-Take any medication unless instructed by your physician °-Make any legal decisions or sign important papers. ° °Meals: °Start with liquid foods such as gelatin or soup. Progress to regular foods as tolerated. Avoid greasy, spicy, heavy foods. If nausea and/or vomiting occur, drink only clear liquids until the nausea and/or vomiting subsides. Call your physician if vomiting continues. ° °Special Instructions/Symptoms: °Your throat may feel dry or sore from the anesthesia or the breathing tube placed in your throat during surgery. If this causes discomfort, gargle with warm salt water. The discomfort should disappear within 24 hours. ° °If you had a scopolamine patch placed behind your ear for the management of post- operative nausea and/or vomiting: ° °1. The medication in the patch is effective for 72 hours, after which it should be removed.  Wrap patch in a tissue and discard in the trash. Wash hands thoroughly with soap and water. °2. You may remove the patch earlier than 72 hours if you experience unpleasant side effects which may include dry mouth, dizziness or visual disturbances. °3. Avoid touching the patch. Wash your hands with soap and water after contact with the patch. °  ° °

## 2015-11-16 NOTE — Progress Notes (Signed)
Dr. Brantley Stage explained to pt the procedure and sentinel node biopsy for the right breast.  Pt verbalized understanding and gave consent.

## 2015-11-16 NOTE — Progress Notes (Signed)
Assisted Dr. M. Judd with right, ultrasound guided, pectoralis block. Side rails up, monitors on throughout procedure. See vital signs in flow sheet. Tolerated Procedure well. 

## 2015-11-16 NOTE — Interval H&P Note (Signed)
History and Physical Interval Note:  11/16/2015 8:27 AM  Jeanne Parker  has presented today for surgery, with the diagnosis of RIGHT BREAST CANCER  The various methods of treatment have been discussed with the patient and family. After consideration of risks, benefits and other options for treatment, the patient has consented to  Procedure(s): BREAST LUMPECTOMY WITH RADIOACTIVE SEED LOCALIZATION (Right) as a surgical intervention .  The patient's history has been reviewed, patient examined, no change in status, stable for surgery.  I have reviewed the patient's chart and labs.  Questions were answered to the patient's satisfaction.     Natasha Paulson A.

## 2015-11-17 ENCOUNTER — Encounter (HOSPITAL_BASED_OUTPATIENT_CLINIC_OR_DEPARTMENT_OTHER): Payer: Self-pay | Admitting: Surgery

## 2015-11-23 NOTE — Assessment & Plan Note (Signed)
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 I discussed the margins as well as lymph node surgeries. We also discussed the final staging along with previously performed ER/PR and HER-2/neu testing. I discussed with her that the final stage is stage IIa.  Recommendations: 1. Oncotype DX testing to determine if chemotherapy would be of any benefit followed by 2. Adjuvant radiation therapy followed by 3. Adjuvant antiestrogen therapy  Return to clinic based upon Oncotype DX score result

## 2015-11-24 ENCOUNTER — Encounter: Payer: Self-pay | Admitting: Hematology and Oncology

## 2015-11-24 ENCOUNTER — Ambulatory Visit (HOSPITAL_BASED_OUTPATIENT_CLINIC_OR_DEPARTMENT_OTHER): Payer: 59 | Admitting: Hematology and Oncology

## 2015-11-24 VITALS — BP 136/84 | HR 83 | Temp 98.1°F | Resp 20

## 2015-11-24 DIAGNOSIS — C50211 Malignant neoplasm of upper-inner quadrant of right female breast: Secondary | ICD-10-CM

## 2015-11-24 NOTE — Progress Notes (Signed)
Patient Care Team: Rowe Clack, MD as PCP - General (Internal Medicine) Rigoberto Noel, MD (Pulmonary Disease) Marylynn Pearson, MD (Obstetrics and Gynecology) Juanita Craver, MD (Gastroenterology) Erroll Luna, MD as Consulting Physician (General Surgery) Nicholas Lose, MD as Consulting Physician (Hematology and Oncology) Eppie Gibson, MD as Attending Physician (Radiation Oncology)  DIAGNOSIS: Breast cancer of upper-inner quadrant of right female breast [facility])   Staging form: Breast, AJCC 7th Edition     Clinical stage from 10/25/2015: Stage IA (T1c, N0, M0) - Unsigned       Staging comments: Staged at breast conference on 11.30.16      Pathologic stage from 11/17/2015: Stage IIA (T2, N0, cM0) - Unsigned       Staging comments: Staged on surgical specimen by Dr. 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 She is here today to discuss the results. She is recovering very well from the surgery. Denies any pain or discomfort. She is not using any pain medications.  REVIEW OF SYSTEMS:   Constitutional: Denies fevers, chills or abnormal weight loss Eyes: Denies blurriness of vision Ears, nose, mouth, throat, and face: Denies mucositis or sore throat Respiratory: Denies cough, dyspnea or  wheezes Cardiovascular: Denies palpitation, chest discomfort Gastrointestinal:  Denies nausea, heartburn or change in bowel habits Skin: Denies abnormal skin rashes Lymphatics: Denies new lymphadenopathy or easy bruising Neurological:Denies numbness, tingling or new weaknesses Behavioral/Psych: Mood is stable, no new changes  Extremities: No lower extremity edema Breast: discomfort from recent lumpectomy. All other systems were reviewed with the patient and are negative.  I have reviewed the past medical history, past surgical history, social history and family history with the patient and they are unchanged from previous note.  ALLERGIES:  has No Known Allergies.  MEDICATIONS:  Current Outpatient Prescriptions  Medication Sig Dispense Refill  . citalopram (CELEXA) 20 MG tablet Take 1 tablet (20 mg total) by mouth daily. 90 tablet 3  . eszopiclone (LUNESTA) 2 MG TABS tablet Take 1 tablet (2 mg total) by mouth at bedtime as needed for sleep. Take immediately before bedtime 90 tablet 0  . levothyroxine (SYNTHROID, LEVOTHROID) 75 MCG tablet Take 1 tablet (75 mcg total) by mouth daily before breakfast. 90 tablet 3  . oxyCODONE-acetaminophen (ROXICET) 5-325 MG tablet Take 1-2 tablets by mouth every 4 (four) hours as needed. 30 tablet 0   No current facility-administered medications for this visit.    PHYSICAL EXAMINATION: ECOG PERFORMANCE STATUS: 1 - Symptomatic but completely ambulatory  Filed Vitals:   11/24/15 0922  BP: 136/84  Pulse: 83  Temp: 98.1 F (36.7 C)  Resp: 20   There were no vitals filed for this visit.  GENERAL:alert, no distress and comfortable SKIN: skin color, texture, turgor are normal, no rashes or significant lesions EYES: normal, Conjunctiva are pink and non-injected, sclera clear OROPHARYNX:no  exudate, no erythema and lips, buccal mucosa, and tongue normal  NECK: supple, thyroid normal size, non-tender, without nodularity LYMPH:  no palpable  lymphadenopathy in the cervical, axillary or inguinal LUNGS: clear to auscultation and percussion with normal breathing effort HEART: regular rate & rhythm and no murmurs and no lower extremity edema ABDOMEN:abdomen soft, non-tender and normal bowel sounds MUSCULOSKELETAL:no cyanosis of digits and no clubbing  NEURO: alert & oriented x 3 with fluent speech, no focal motor/sensory deficits EXTREMITIES: No lower extremity edema  LABORATORY DATA:  I have reviewed the data as listed   Chemistry      Component Value Date/Time   NA 141 10/25/2015 1246   NA 135 06/29/2014 1044   K 4.0 10/25/2015 1246   K 4.0 06/29/2014 1044   CL 103 06/29/2014 1044   CO2 27 10/25/2015 1246   CO2 27 06/29/2014 1044   BUN 13.0 10/25/2015 1246   BUN 11 06/29/2014 1044   CREATININE 0.9 10/25/2015 1246   CREATININE 0.7 06/29/2014 1044      Component Value Date/Time   CALCIUM 9.7 10/25/2015 1246   CALCIUM 9.0 06/29/2014 1044   ALKPHOS 36* 10/25/2015 1246   ALKPHOS 21* 06/29/2014 1044   AST 22 10/25/2015 1246   AST 15 06/29/2014 1044   ALT 21 10/25/2015 1246   ALT 12 06/29/2014 1044   BILITOT 0.34 10/25/2015 1246   BILITOT 0.4 06/29/2014 1044       Lab Results  Component Value Date   WBC 4.6 10/25/2015   HGB 13.0 10/25/2015   HCT 39.5 10/25/2015   MCV 95.0 10/25/2015   PLT 282 10/25/2015   NEUTROABS 2.5 10/25/2015     ASSESSMENT & PLAN:  Breast cancer of upper-inner quadrant of right female breast ([place]) Right lumpectomy 11/16/2015: 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 I discussed the margins as well as lymph node surgeries. We also discussed the final staging along with previously performed ER/PR and HER-2/neu testing. I discussed with her that the final stage is stage  IIa.the tumor is bigger than what she anticipated based on the initial mammogram and ultrasound. I reassured her that it is not the size but it is the Oncotype DX to determine his risk. Based upon grade 1, Ki-67 3%, lack of lymphovascular invasion, I hope it will come back as low risk disease.  Recommendations: 1. Oncotype DX testing to determine if chemotherapy would be of any benefit followed by 2. Adjuvant radiation therapy followed by 3. Adjuvant antiestrogen therapy  Return to clinic based upon Oncotype DX score result  No orders of the defined types were placed in this encounter.   The patient has a good understanding of the overall plan. she agrees with it. she will call with any problems that may develop before the next visit here.   Rulon Eisenmenger, MD 11/24/2015

## 2015-11-28 ENCOUNTER — Encounter: Payer: Self-pay | Admitting: *Deleted

## 2015-11-28 NOTE — Progress Notes (Signed)
Received order per Dr. Lindi Adie for Oncotype Testing. Requisition sent to pathology. Received by Alyse Low.

## 2015-11-30 ENCOUNTER — Ambulatory Visit: Payer: 59 | Admitting: Psychology

## 2015-12-06 ENCOUNTER — Ambulatory Visit (INDEPENDENT_AMBULATORY_CARE_PROVIDER_SITE_OTHER): Payer: 59 | Admitting: Psychology

## 2015-12-06 DIAGNOSIS — F4322 Adjustment disorder with anxiety: Secondary | ICD-10-CM | POA: Diagnosis not present

## 2015-12-07 ENCOUNTER — Encounter (HOSPITAL_COMMUNITY): Payer: Self-pay

## 2015-12-11 ENCOUNTER — Telehealth: Payer: Self-pay | Admitting: *Deleted

## 2015-12-11 NOTE — Telephone Encounter (Signed)
Spoke to patient and informed her of her oncotype results of 11.  She will not need chemo and I have placed referral for Dr. Isidore Moos.  She is so happy.  Encouraged her to call with any needs or concerns.

## 2015-12-18 NOTE — Progress Notes (Signed)
Location of Breast Cancer: Right Breast  Histology per Pathology Report:  10/11/15 Diagnosis Breast, right, needle core biopsy, 1 o'clock - 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  11/16/15 Diagnosis 1. Breast, lumpectomy, Right - INVASIVE DUCTAL CARCINOMA, SEE COMMENT. - INVASIVE 0.6 CM FROM NEAREST MARGIN (POSTERIOR). - DUCTAL CARCINOMA IN SITU. - IN SITU CARCINOMA IS 0.6 CM FROM NEAREST MARGIN (POSTERIOR). 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 - PREVIOUS BIOPSY SITE. - SEE TUMOR SYNOPTIC TEMPLATE BELOW. 2. Lymph node, sentinel, biopsy, Right axilla #1 - ONE LYMPH NODE, NEGATIVE FOR TUMOR (0/1) 3. Lymph node, sentinel, biopsy, Right axilla #2 - ONE LYMPH NODE, NEGATIVE FOR TUMOR (0/1) 4. Lymph node, sentinel, biopsy, Right axilla #3 - ONE LYMPH NODE, NEGATIVE FOR TUMOR (0/1) 5. Lymph node, sentinel, biopsy, Right axilla #4 - ONE LYMPH NODE, NEGATIVE FOR TUMOR (0/1)  Receptor Status: ER(95% POS), PR (80% POS), Her2-neu (NEG)  Did patient present with symptoms or was this found on screening mammography?:  It was found on a screening mammogram.  Past/Anticipated interventions by surgeon, if any: Dr. Brantley Stage performed: Right Partial mastectomy with seed localization And right sentinal lymph node mapping on 11/16/15  Past/Anticipated interventions by medical oncology, if any: She saw Dr. Lindi Adie on 11/14/15 and the recommendation is: Recommendations: 1. Oncotype DX testing to determine if chemotherapy would be of any benefit followed by (her oncotype testing came back 12/11/15, and she will not require chemotherapy) 2. Adjuvant radiation therapy followed by 3. Adjuvant antiestrogen therapy  Lymphedema issues, if any:  No  Pain issues, if any:  no  SAFETY ISSUES:  Prior radiation? No  Pacemaker/ICD? No  Possible current pregnancy? No  Is the patient on methotrexate? No  Current  Complaints / other details:   In terms of breast cancer risk profile:  She menarched at early age of 39 and is still premenopausal last period was in October 2016 but they're not regular  She had 3 pregnancy, her first child was born at age 28  She has received birth control pills for approximately 4 years.  She was never exposed to fertility medications or hormone replacement therapy.  She has family history of Breast/GYN/GI cancer Sister LCIS 2016 at age of 30, grandfather paternal side age 52 with colon cancer  BP 123/79 mmHg  Pulse 67  Temp(Src) 97.9 F (36.6 C)  Ht 5' 5"  (1.651 m)  Wt 140 lb 4.8 oz (63.64 kg)  BMI 23.35 kg/m2    Doss Cybulski, Stephani Police, RN 12/18/2015,11:33 AM

## 2015-12-20 ENCOUNTER — Encounter: Payer: Self-pay | Admitting: Radiation Oncology

## 2015-12-20 ENCOUNTER — Ambulatory Visit
Admission: RE | Admit: 2015-12-20 | Discharge: 2015-12-20 | Disposition: A | Discharge status: Home / Self Care | Payer: 59 | Source: Ambulatory Visit | Attending: Radiation Oncology | Admitting: Radiation Oncology

## 2015-12-20 VITALS — BP 123/79 | HR 67 | Temp 97.9°F | Ht 65.0 in | Wt 140.3 lb

## 2015-12-20 DIAGNOSIS — Z510 Encounter for antineoplastic radiation therapy: Secondary | ICD-10-CM | POA: Insufficient documentation

## 2015-12-20 DIAGNOSIS — C50211 Malignant neoplasm of upper-inner quadrant of right female breast: Secondary | ICD-10-CM | POA: Insufficient documentation

## 2015-12-20 DIAGNOSIS — Z170 Estrogen receptor positive status [ER+]: Secondary | ICD-10-CM | POA: Diagnosis not present

## 2015-12-20 NOTE — Addendum Note (Signed)
Encounter addended by: Ernst Spell, RN on: 12/20/2015  2:56 PM<BR>     Documentation filed: Arn Medal VN

## 2015-12-20 NOTE — Progress Notes (Signed)
  Radiation Oncology         (336) [phone] ________________________________  Name: Jeanne Parker MRN: [id number]  Date: 12/20/2015  DOB: 02/13/1961  Follow-Up Visit Note  Outpatient  CC: Gwendolyn Grant, MD  Erroll Luna, MD  Diagnosis:      ICD-9-CM ICD-10-CM   1. Breast cancer of upper-inner quadrant of right female breast ([place]) 174.2 C50.211     Stage II T2N0M0 Right Breast UIQ Invasive Ductal Carcinoma, ER+ / PR+ / Her2neg, Grade 1  Narrative:  The patient returns today for follow-up.     Since consultation, she underwent right lumpectomy and sentinel lymph node biopsy by Dr Brantley Stage on 11-16-15, revealing a 2.7 cm tumor (IDC)  with DCIS and LCIS, margins negative to all in situ and invasion disease. Nodes negative. Disease has characteristics as above in the diagnosis.           Oncotype Dx score was low risk. She has no complaints, healed well. Denies any chance of pregnancy.  ALLERGIES:  has No Known Allergies.  Meds: Current Outpatient Prescriptions  Medication Sig Dispense Refill  . citalopram (CELEXA) 20 MG tablet Take 1 tablet (20 mg total) by mouth daily. 90 tablet 3  . eszopiclone (LUNESTA) 2 MG TABS tablet Take 1 tablet (2 mg total) by mouth at bedtime as needed for sleep. Take immediately before bedtime 90 tablet 0  . levothyroxine (SYNTHROID, LEVOTHROID) 75 MCG tablet Take 1 tablet (75 mcg total) by mouth daily before breakfast. 90 tablet 3  . oxyCODONE-acetaminophen (ROXICET) 5-325 MG tablet Take 1-2 tablets by mouth every 4 (four) hours as needed. (Patient not taking: Reported on 12/20/2015) 30 tablet 0   No current facility-administered medications for this encounter.    Physical Findings:  height is 5\' 5"  (1.651 m) and weight is 140 lb 4.8 oz (63.64 kg). Her temperature is 97.9 F (36.6 C). Her blood pressure is 123/79 and her pulse is 67. Marland Kitchen     General: Alert and oriented, in no acute distress Skin: no concerning lesions Ext: no UE edema Breast  exam reveals Right breast incisional scars have healed well.  Modest seroma at lumpectomy site.  Lab Findings: Lab Results  Component Value Date   WBC 4.6 10/25/2015   HGB 13.0 10/25/2015   HCT 39.5 10/25/2015   MCV 95.0 10/25/2015   PLT 282 10/25/2015       Radiographic Findings: No results found.  Impression/Plan: Stage II right breast cancer  We discussed adjuvant radiotherapy today.  I recommend radiotherapy to the right breast  in order to reduce risk of locoregional recurrence by 2/3.  The risks, benefits and side effects of this treatment were discussed in detail.  She understands that radiotherapy is associated with skin irritation and fatigue in the acute setting. Late effects can include cosmetic changes and rare injury to internal organs.   She is enthusiastic about proceeding with treatment. A consent form has been signed and placed in her chart. Anticipate simulation next week.   _____________________________________   Eppie Gibson, MD

## 2015-12-25 ENCOUNTER — Ambulatory Visit
Admission: RE | Admit: 2015-12-25 | Discharge: 2015-12-25 | Disposition: A | Discharge status: Home / Self Care | Payer: 59 | Source: Ambulatory Visit | Attending: Radiation Oncology | Admitting: Radiation Oncology

## 2015-12-25 DIAGNOSIS — Z510 Encounter for antineoplastic radiation therapy: Secondary | ICD-10-CM | POA: Diagnosis not present

## 2015-12-25 DIAGNOSIS — C50211 Malignant neoplasm of upper-inner quadrant of right female breast: Secondary | ICD-10-CM

## 2015-12-25 NOTE — Progress Notes (Signed)
  Radiation Oncology         (336) [phone] ________________________________  Name: Jeanne Parker MRN: [id number]  Date: 12/25/2015  DOB: [birth year]  SIMULATION AND TREATMENT PLANNING NOTE    Outpatient  DIAGNOSIS:     ICD-9-CM ICD-10-CM   1. Breast cancer of upper-inner quadrant of right female breast ([place]) 174.2 C50.211     NARRATIVE:  The patient was brought to the [facility].  Identity was confirmed.  All relevant records and images related to the planned course of therapy were reviewed.  The patient freely provided informed written consent to proceed with treatment after reviewing the details related to the planned course of therapy. The consent form was witnessed and verified by the simulation staff.    Then, the patient was set-up in a stable reproducible supine position for radiation therapy with her ipsilateral arm over her head, and her upper body secured in a custom-made Vac-lok device.  CT images were obtained.  Surface markings were placed.  The CT images were loaded into the planning software.    TREATMENT PLANNING NOTE: Treatment planning then occurred.  The radiation prescription was entered and confirmed.     A total of 3 medically necessary complex treatment devices were fabricated and supervised by me: 2 fields with MLCs for custom blocks to protect heart, and lungs;  and, a Vac-lok. MORE COMPLEX DEVICES MAY BE MADE IN DOSIMETRY FOR FIELD IN FIELD BEAMS FOR DOSE HOMOGENEITY.  I have requested : 3D Simulation  I have requested a DVH of the following structures: lungs, heart, lumpectomy cavity.    The patient will receive 40.05 Gy in 15 fractions to the right breast with 2 tangential fields.   This will be followed by a boost.  Optical Surface Tracking Plan:  Since intensity modulated radiotherapy (IMRT) and 3D conformal radiation treatment methods are predicated on accurate and precise positioning for treatment, intrafraction motion monitoring is  medically necessary to ensure accurate and safe treatment delivery. The ability to quantify intrafraction motion without excessive ionizing radiation dose can only be performed with optical surface tracking. Accordingly, surface imaging offers the opportunity to obtain 3D measurements of patient position throughout IMRT and 3D treatments without excessive radiation exposure. I am ordering optical surface tracking for this patient's upcoming course of radiotherapy.  ________________________________   Reference:  Ursula Alert, J, et al. Surface imaging-based analysis of intrafraction motion for breast radiotherapy patients.Journal of [place], n. 6, nov. 2014. ISSN [id number].  Available at: <http://www.jacmp.org/index.php/jacmp/article/view/4957>.    -----------------------------------  Eppie Gibson, MD

## 2015-12-27 DIAGNOSIS — Z510 Encounter for antineoplastic radiation therapy: Secondary | ICD-10-CM | POA: Diagnosis not present

## 2015-12-29 ENCOUNTER — Other Ambulatory Visit: Payer: Self-pay | Admitting: *Deleted

## 2016-01-01 ENCOUNTER — Ambulatory Visit
Admission: RE | Admit: 2016-01-01 | Discharge: 2016-01-01 | Disposition: A | Discharge status: Home / Self Care | Payer: 59 | Source: Ambulatory Visit | Attending: Radiation Oncology | Admitting: Radiation Oncology

## 2016-01-01 DIAGNOSIS — Z510 Encounter for antineoplastic radiation therapy: Secondary | ICD-10-CM | POA: Diagnosis not present

## 2016-01-01 DIAGNOSIS — C50211 Malignant neoplasm of upper-inner quadrant of right female breast: Secondary | ICD-10-CM

## 2016-01-01 MED ORDER — RADIAPLEXRX EX GEL
Freq: Once | CUTANEOUS | Status: AC
  Administered 2016-01-01: 15:00:00 via TOPICAL

## 2016-01-01 MED ORDER — ALRA NON-METALLIC DEODORANT (RAD-ONC)
1.0000 "application " | Freq: Once | TOPICAL | Status: AC
  Administered 2016-01-01: 1 via TOPICAL

## 2016-01-01 NOTE — Progress Notes (Signed)
Pt here for patient teaching.  Pt given Radiation and You booklet, skin care instructions, Alra deodorant and Radiaplex gel. Pt reports they have not watched the Radiation Therapy Education video, but were given the link to watch at home.  Reviewed areas of pertinence such as fatigue, hair loss, skin changes, breast tenderness, breast swelling, cough, shortness of breath, earaches and taste changes . Pt able to give teach back of to pat skin, use unscented/gentle soap, use baby wipes and drink plenty of water,apply Radiaplex bid, avoid applying anything to skin within 4 hours of treatment, avoid wearing an under wire bra and to use an electric razor if they must shave. Pt verbalizes understanding of information given and will contact nursing with any questions or concerns.     Http://rtanswers.org/treatmentinformation/whattoexpect/index

## 2016-01-02 ENCOUNTER — Ambulatory Visit
Admission: RE | Admit: 2016-01-02 | Discharge: 2016-01-02 | Disposition: A | Discharge status: Home / Self Care | Payer: 59 | Source: Ambulatory Visit | Attending: Radiation Oncology | Admitting: Radiation Oncology

## 2016-01-02 ENCOUNTER — Telehealth: Payer: Self-pay | Admitting: Hematology and Oncology

## 2016-01-02 DIAGNOSIS — Z510 Encounter for antineoplastic radiation therapy: Secondary | ICD-10-CM | POA: Diagnosis not present

## 2016-01-02 NOTE — Telephone Encounter (Signed)
Spoke with patient re f/u 3/20.

## 2016-01-03 ENCOUNTER — Ambulatory Visit
Admission: RE | Admit: 2016-01-03 | Discharge: 2016-01-03 | Disposition: A | Discharge status: Home / Self Care | Payer: 59 | Source: Ambulatory Visit | Attending: Radiation Oncology | Admitting: Radiation Oncology

## 2016-01-03 DIAGNOSIS — Z510 Encounter for antineoplastic radiation therapy: Secondary | ICD-10-CM | POA: Diagnosis not present

## 2016-01-04 ENCOUNTER — Ambulatory Visit
Admission: RE | Admit: 2016-01-04 | Discharge: 2016-01-04 | Disposition: A | Discharge status: Home / Self Care | Payer: 59 | Source: Ambulatory Visit | Attending: Radiation Oncology | Admitting: Radiation Oncology

## 2016-01-04 DIAGNOSIS — Z510 Encounter for antineoplastic radiation therapy: Secondary | ICD-10-CM | POA: Diagnosis not present

## 2016-01-05 ENCOUNTER — Ambulatory Visit
Admission: RE | Admit: 2016-01-05 | Discharge: 2016-01-05 | Disposition: A | Discharge status: Home / Self Care | Payer: 59 | Source: Ambulatory Visit | Attending: Radiation Oncology | Admitting: Radiation Oncology

## 2016-01-05 DIAGNOSIS — Z510 Encounter for antineoplastic radiation therapy: Secondary | ICD-10-CM | POA: Diagnosis not present

## 2016-01-08 ENCOUNTER — Ambulatory Visit
Admission: RE | Admit: 2016-01-08 | Discharge: 2016-01-08 | Disposition: A | Discharge status: Home / Self Care | Payer: 59 | Source: Ambulatory Visit | Attending: Radiation Oncology | Admitting: Radiation Oncology

## 2016-01-08 ENCOUNTER — Encounter: Payer: Self-pay | Admitting: Radiation Oncology

## 2016-01-08 VITALS — BP 118/72 | HR 72 | Temp 98.2°F | Ht 65.0 in | Wt 142.1 lb

## 2016-01-08 DIAGNOSIS — C50211 Malignant neoplasm of upper-inner quadrant of right female breast: Secondary | ICD-10-CM

## 2016-01-08 DIAGNOSIS — Z510 Encounter for antineoplastic radiation therapy: Secondary | ICD-10-CM | POA: Diagnosis not present

## 2016-01-08 NOTE — Progress Notes (Signed)
Jeanne Parker presents for her 5th fraction of radiation to her Right Breast. She denies fatigue, but reports she is not sleeping well at night, which has caused her to feel sleepy during the day. She denies any redness or tenderness to her breast, and she is using the radiaplex cream twice daily.   BP 118/72 mmHg  Pulse 72  Temp(Src) 98.2 F (36.8 C)  Ht 5\' 5"  (1.651 m)  Wt 142 lb 1.6 oz (64.456 kg)  BMI 23.65 kg/m2

## 2016-01-08 NOTE — Progress Notes (Signed)
   Weekly Management Note:  Outpatient    ICD-9-CM ICD-10-CM   1. Breast cancer of upper-inner quadrant of right female breast (HCC) 174.2 C50.211     Current Dose:  13.35 Gy  Projected Dose: 50.05 Gy   Narrative:  The patient presents for routine under treatment assessment.  CBCT/MVCT images/Port film x-rays were reviewed.  The chart was checked. She acknowledges that going through cancer treatments is disturbing and emotionally difficult.  We talked about this for a while. She has good support from her significant other, who is here today. Using Radiaplex cream.  Physical Findings:  height is 5\' 5"  (1.651 m) and weight is 142 lb 1.6 oz (64.456 kg). Her temperature is 98.2 F (36.8 C). Her blood pressure is 118/72 and her pulse is 72.   Wt Readings from Last 3 Encounters:  01/08/16 142 lb 1.6 oz (64.456 kg)  12/20/15 140 lb 4.8 oz (63.64 kg)  11/16/15 140 lb 3.2 oz (63.594 kg)   No substantial changes over skin of right breast thus far.  Impression:  The patient is tolerating radiotherapy.  Plan:  Continue radiotherapy as planned. Empathetic listening and support given .We talked about options including social work referral, therapy, and Oncologist through [employer]. She is not interested in these support services at this time.  I encouraged her to let me know if she changes her mind.  ________________________________   Eppie Gibson, M.D.

## 2016-01-09 ENCOUNTER — Telehealth: Payer: Self-pay | Admitting: *Deleted

## 2016-01-09 ENCOUNTER — Ambulatory Visit
Admission: RE | Admit: 2016-01-09 | Discharge: 2016-01-09 | Disposition: A | Discharge status: Home / Self Care | Payer: 59 | Source: Ambulatory Visit | Attending: Radiation Oncology | Admitting: Radiation Oncology

## 2016-01-09 DIAGNOSIS — Z510 Encounter for antineoplastic radiation therapy: Secondary | ICD-10-CM | POA: Diagnosis not present

## 2016-01-09 NOTE — Telephone Encounter (Signed)
  Oncology Nurse Navigator Documentation  Navigator Location: [facility] (01/09/16 1200) Navigator Encounter Type: Telephone (01/09/16 1200) Telephone: Outgoing Call (01/09/16 1200)         Patient Visit Type: [id number] (01/09/16 1200) Treatment Phase: First Radiation Tx (01/09/16 1200) Barriers/Navigation Needs: No barriers at this time;No Questions;No Needs (01/09/16 1200)   Interventions: None required (01/09/16 1200)                      Time Spent with Patient: 15 (01/09/16 1200)

## 2016-01-10 ENCOUNTER — Ambulatory Visit
Admission: RE | Admit: 2016-01-10 | Discharge: 2016-01-10 | Disposition: A | Discharge status: Home / Self Care | Payer: 59 | Source: Ambulatory Visit | Attending: Radiation Oncology | Admitting: Radiation Oncology

## 2016-01-10 DIAGNOSIS — Z510 Encounter for antineoplastic radiation therapy: Secondary | ICD-10-CM | POA: Diagnosis not present

## 2016-01-10 DIAGNOSIS — C50211 Malignant neoplasm of upper-inner quadrant of right female breast: Secondary | ICD-10-CM

## 2016-01-10 NOTE — Progress Notes (Signed)
   Weekly Management Note:  Outpatient    ICD-9-CM ICD-10-CM   1. Breast cancer of upper-inner quadrant of right female breast (HCC) 174.2 C50.211     Current Dose:  18.69 Gy  Projected Dose: 50.05 Gy   Narrative:  The patient presents for routine under treatment assessment.  CBCT/MVCT images/Port film x-rays were reviewed.  The chart was checked. She voices no new issues.  Physical Findings:  vitals were not taken for this visit.  Wt Readings from Last 3 Encounters:  01/08/16 142 lb 1.6 oz (64.456 kg)  12/20/15 140 lb 4.8 oz (63.64 kg)  11/16/15 140 lb 3.2 oz (63.594 kg)   Still, substantial changes over skin of right breast thus far.  Impression:  The patient is tolerating radiotherapy.  Plan:  Continue radiotherapy as planned.  She had some questions about her set-up, xrays, and treatment which I answered today for her. I let her know that my partner will see her next week during my vacation. ________________________________   Eppie Gibson, M.D.

## 2016-01-11 ENCOUNTER — Ambulatory Visit
Admission: RE | Admit: 2016-01-11 | Discharge: 2016-01-11 | Disposition: A | Discharge status: Home / Self Care | Payer: 59 | Source: Ambulatory Visit | Attending: Radiation Oncology | Admitting: Radiation Oncology

## 2016-01-11 ENCOUNTER — Ambulatory Visit: Payer: 59

## 2016-01-11 DIAGNOSIS — Z510 Encounter for antineoplastic radiation therapy: Secondary | ICD-10-CM | POA: Diagnosis not present

## 2016-01-12 ENCOUNTER — Ambulatory Visit
Admission: RE | Admit: 2016-01-12 | Discharge: 2016-01-12 | Disposition: A | Discharge status: Home / Self Care | Payer: 59 | Source: Ambulatory Visit | Attending: Radiation Oncology | Admitting: Radiation Oncology

## 2016-01-12 ENCOUNTER — Ambulatory Visit: Payer: 59

## 2016-01-12 DIAGNOSIS — Z510 Encounter for antineoplastic radiation therapy: Secondary | ICD-10-CM | POA: Diagnosis not present

## 2016-01-15 ENCOUNTER — Ambulatory Visit
Admission: RE | Admit: 2016-01-15 | Discharge: 2016-01-15 | Disposition: A | Discharge status: Home / Self Care | Payer: 59 | Source: Ambulatory Visit | Attending: Radiation Oncology | Admitting: Radiation Oncology

## 2016-01-15 DIAGNOSIS — Z510 Encounter for antineoplastic radiation therapy: Secondary | ICD-10-CM | POA: Diagnosis not present

## 2016-01-16 ENCOUNTER — Ambulatory Visit: Admission: RE | Admit: 2016-01-16 | Payer: 59 | Source: Ambulatory Visit | Admitting: Radiation Oncology

## 2016-01-16 ENCOUNTER — Ambulatory Visit
Admission: RE | Admit: 2016-01-16 | Discharge: 2016-01-16 | Disposition: A | Discharge status: Home / Self Care | Payer: 59 | Source: Ambulatory Visit | Attending: Radiation Oncology | Admitting: Radiation Oncology

## 2016-01-16 DIAGNOSIS — Z510 Encounter for antineoplastic radiation therapy: Secondary | ICD-10-CM | POA: Diagnosis not present

## 2016-01-17 ENCOUNTER — Ambulatory Visit: Payer: 59 | Admitting: Radiation Oncology

## 2016-01-17 ENCOUNTER — Ambulatory Visit
Admission: RE | Admit: 2016-01-17 | Discharge: 2016-01-17 | Disposition: A | Discharge status: Home / Self Care | Payer: 59 | Source: Ambulatory Visit | Attending: Radiation Oncology | Admitting: Radiation Oncology

## 2016-01-17 DIAGNOSIS — Z510 Encounter for antineoplastic radiation therapy: Secondary | ICD-10-CM | POA: Diagnosis not present

## 2016-01-18 ENCOUNTER — Ambulatory Visit
Admission: RE | Admit: 2016-01-18 | Discharge: 2016-01-18 | Disposition: A | Discharge status: Home / Self Care | Payer: 59 | Source: Ambulatory Visit | Attending: Radiation Oncology | Admitting: Radiation Oncology

## 2016-01-18 DIAGNOSIS — Z510 Encounter for antineoplastic radiation therapy: Secondary | ICD-10-CM | POA: Diagnosis not present

## 2016-01-19 ENCOUNTER — Ambulatory Visit
Admission: RE | Admit: 2016-01-19 | Discharge: 2016-01-19 | Disposition: A | Discharge status: Home / Self Care | Payer: 59 | Source: Ambulatory Visit | Attending: Radiation Oncology | Admitting: Radiation Oncology

## 2016-01-19 DIAGNOSIS — Z510 Encounter for antineoplastic radiation therapy: Secondary | ICD-10-CM | POA: Diagnosis not present

## 2016-01-22 ENCOUNTER — Ambulatory Visit: Payer: 59 | Admitting: Radiation Oncology

## 2016-01-22 ENCOUNTER — Ambulatory Visit
Admission: RE | Admit: 2016-01-22 | Discharge: 2016-01-22 | Disposition: A | Discharge status: Home / Self Care | Payer: 59 | Source: Ambulatory Visit | Attending: Radiation Oncology | Admitting: Radiation Oncology

## 2016-01-22 ENCOUNTER — Encounter: Payer: Self-pay | Admitting: Radiation Oncology

## 2016-01-22 VITALS — BP 119/73 | HR 63 | Temp 97.8°F | Ht 65.0 in | Wt 143.5 lb

## 2016-01-22 DIAGNOSIS — C50211 Malignant neoplasm of upper-inner quadrant of right female breast: Secondary | ICD-10-CM

## 2016-01-22 DIAGNOSIS — Z510 Encounter for antineoplastic radiation therapy: Secondary | ICD-10-CM | POA: Diagnosis not present

## 2016-01-22 NOTE — Progress Notes (Signed)
[name] is here for her 15th fraction of radiation to her Right Breast. She denies any extreme fatigue, but reports normal fatigue from her work schedule. She has some redness to her lateral upper breast. She denies any pain or tenderness at this site. She is using the radiaplex cream twice daily.   BP 119/73 mmHg  Pulse 63  Temp(Src) 97.8 F (36.6 C)  Ht 5\' 5"  (1.651 m)  Wt 143 lb 8 oz (65.091 kg)  BMI 23.88 kg/m2

## 2016-01-22 NOTE — Progress Notes (Signed)
   Weekly Management Note:  Outpatient    ICD-9-CM ICD-10-CM   1. Breast cancer of upper-inner quadrant of right female breast (HCC) 174.2 C50.211     Current Dose: 40.05 Gy  Projected Dose: 50.05 Gy   Narrative:  The patient presents for routine under treatment assessment.  CBCT/MVCT images/Port film x-rays were reviewed.  The chart was checked.  She denies any extreme fatigue, but reports normal fatigue from her work schedule. She has some redness / rash-like changes over her right breast  She is using the radiaplex cream twice daily.    Physical Findings:  height is 5\' 5"  (1.651 m) and weight is 143 lb 8 oz (65.091 kg). Her temperature is 97.8 F (36.6 C). Her blood pressure is 119/73 and her pulse is 63.   Wt Readings from Last 3 Encounters:  01/22/16 143 lb 8 oz (65.091 kg)  01/08/16 142 lb 1.6 oz (64.456 kg)  12/20/15 140 lb 4.8 oz (63.64 kg)  Right breast -erythematous follicular rash in keeping with expected changes from radiotherapy. Skin is intact.  Impression:  The patient is tolerating radiotherapy.  Plan:  Continue radiotherapy as planned.  Reviewed results from Oncotype Dx test as she has questions re: prognosis. Discussed importance of taking anti-estrogen pill after radiotherapy. ________________________________   Eppie Gibson, M.D.

## 2016-01-23 ENCOUNTER — Ambulatory Visit
Admission: RE | Admit: 2016-01-23 | Discharge: 2016-01-23 | Disposition: A | Discharge status: Home / Self Care | Payer: 59 | Source: Ambulatory Visit | Attending: Radiation Oncology | Admitting: Radiation Oncology

## 2016-01-23 DIAGNOSIS — Z510 Encounter for antineoplastic radiation therapy: Secondary | ICD-10-CM | POA: Diagnosis not present

## 2016-01-24 ENCOUNTER — Ambulatory Visit
Admission: RE | Admit: 2016-01-24 | Discharge: 2016-01-24 | Disposition: A | Discharge status: Home / Self Care | Payer: 59 | Source: Ambulatory Visit | Attending: Radiation Oncology | Admitting: Radiation Oncology

## 2016-01-24 DIAGNOSIS — Z510 Encounter for antineoplastic radiation therapy: Secondary | ICD-10-CM | POA: Diagnosis not present

## 2016-01-25 ENCOUNTER — Ambulatory Visit
Admission: RE | Admit: 2016-01-25 | Discharge: 2016-01-25 | Disposition: A | Discharge status: Home / Self Care | Payer: 59 | Source: Ambulatory Visit | Attending: Radiation Oncology | Admitting: Radiation Oncology

## 2016-01-25 DIAGNOSIS — Z510 Encounter for antineoplastic radiation therapy: Secondary | ICD-10-CM | POA: Diagnosis not present

## 2016-01-26 ENCOUNTER — Ambulatory Visit
Admission: RE | Admit: 2016-01-26 | Discharge: 2016-01-26 | Disposition: A | Discharge status: Home / Self Care | Payer: 59 | Source: Ambulatory Visit | Attending: Radiation Oncology | Admitting: Radiation Oncology

## 2016-01-26 DIAGNOSIS — Z510 Encounter for antineoplastic radiation therapy: Secondary | ICD-10-CM | POA: Diagnosis not present

## 2016-01-29 ENCOUNTER — Encounter: Payer: Self-pay | Admitting: Radiation Oncology

## 2016-01-29 ENCOUNTER — Ambulatory Visit
Admission: RE | Admit: 2016-01-29 | Discharge: 2016-01-29 | Disposition: A | Discharge status: Home / Self Care | Payer: 59 | Source: Ambulatory Visit | Attending: Radiation Oncology | Admitting: Radiation Oncology

## 2016-01-29 ENCOUNTER — Telehealth: Payer: Self-pay | Admitting: *Deleted

## 2016-01-29 VITALS — BP 126/72 | HR 62 | Temp 97.6°F | Ht 65.0 in | Wt 140.0 lb

## 2016-01-29 DIAGNOSIS — Z923 Personal history of irradiation: Secondary | ICD-10-CM | POA: Insufficient documentation

## 2016-01-29 DIAGNOSIS — Z510 Encounter for antineoplastic radiation therapy: Secondary | ICD-10-CM | POA: Diagnosis not present

## 2016-01-29 DIAGNOSIS — C50211 Malignant neoplasm of upper-inner quadrant of right female breast: Secondary | ICD-10-CM | POA: Insufficient documentation

## 2016-01-29 MED ORDER — RADIAPLEXRX EX GEL
Freq: Once | CUTANEOUS | Status: AC
  Administered 2016-01-29: 09:00:00 via TOPICAL

## 2016-01-29 NOTE — Progress Notes (Signed)
[name] is here for her last treatment of radiation to her Right Breast. She reports fatigue that she really began feeling this past Friday. Her Right Breast is red, and hyperpigmented. She denies tenderness or itching to this area. She is using the radiaplex cream twice daily. She was given a follow up appointment and pamplets on survivorship programs.   BP 126/72 mmHg  Pulse 62  Temp(Src) 97.6 F (36.4 C)  Ht 5\' 5"  (1.651 m)  Wt 140 lb (63.504 kg)  BMI 23.30 kg/m2

## 2016-01-29 NOTE — Progress Notes (Signed)
   Weekly Management Note:  Outpatient    ICD-9-CM ICD-10-CM   1. Breast cancer of upper-inner quadrant of right female breast (HCC) 174.2 C50.211 hyaluronate sodium (RADIAPLEXRX) gel    Current Dose: 50.05 Gy  Projected Dose: 50.05 Gy   Narrative:  The patient presents for routine under treatment assessment.  CBCT/MVCT images/Port film x-rays were reviewed.  The chart was checked.   Jeanne Parker is here for her last treatment of radiation to her Right Breast. She reports fatigue that she really began feeling this past Friday. Her Right Breast is red, and hyperpigmented. She denies tenderness or itching to this area. She is using the radiaplex cream twice daily.    Physical Findings:  height is 5\' 5"  (1.651 m) and weight is 140 lb (63.504 kg). Her temperature is 97.6 F (36.4 C). Her blood pressure is 126/72 and her pulse is 62.   Wt Readings from Last 3 Encounters:  01/29/16 140 lb (63.504 kg)  01/22/16 143 lb 8 oz (65.091 kg)  01/08/16 142 lb 1.6 oz (64.456 kg)  Right breast -erythematous and hyperpigmented follicular rash in keeping with expected changes from radiotherapy. Skin is intact.  Impression:  The patient has tolerated radiotherapy.  Plan:  Followup in 68mo, sooner if needed. Continue Radiaplex.  Fatigue should improve over the next month. 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  Patient was given followup appointment and survivorship information.  She expressed gratitude for her care at the [facility]. ________________________________   Eppie Gibson, M.D.

## 2016-01-29 NOTE — Telephone Encounter (Signed)
Spoke with patient to follow up after XRT completion.  She states she is doing great.  No problems or concerns at this time.  Discussed survivorship program.  Encouraged her to call with any needs or concerns.

## 2016-02-07 ENCOUNTER — Other Ambulatory Visit: Payer: Self-pay | Admitting: Adult Health

## 2016-02-07 ENCOUNTER — Ambulatory Visit: Payer: 59 | Admitting: Psychology

## 2016-02-07 DIAGNOSIS — C50211 Malignant neoplasm of upper-inner quadrant of right female breast: Secondary | ICD-10-CM

## 2016-02-11 NOTE — Assessment & Plan Note (Signed)
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 These include but not limited to insomnia, hot flashes, mood changes, vaginal dryness, bone density loss, and weight gain. We strongly believe that the benefits far outweigh the risks. Patient understands these risks and consented to starting treatment. Planned treatment duration is 5 years.  RTC in 3 months

## 2016-02-12 ENCOUNTER — Ambulatory Visit (HOSPITAL_BASED_OUTPATIENT_CLINIC_OR_DEPARTMENT_OTHER): Payer: 59 | Admitting: Hematology and Oncology

## 2016-02-12 ENCOUNTER — Encounter: Payer: Self-pay | Admitting: Hematology and Oncology

## 2016-02-12 ENCOUNTER — Telehealth: Payer: Self-pay | Admitting: Hematology and Oncology

## 2016-02-12 VITALS — BP 113/63 | HR 75 | Temp 97.5°F | Resp 18 | Wt 141.9 lb

## 2016-02-12 DIAGNOSIS — C50211 Malignant neoplasm of upper-inner quadrant of right female breast: Secondary | ICD-10-CM

## 2016-02-12 MED ORDER — TAMOXIFEN CITRATE 20 MG PO TABS: 20.0000 mg | ORAL_TABLET | Freq: Every day | ORAL | Status: DC

## 2016-02-12 NOTE — Telephone Encounter (Signed)
appt made and avs printed °

## 2016-02-12 NOTE — Progress Notes (Signed)
Patient Care Team: Rowe Clack, MD as PCP - General (Internal Medicine) Rigoberto Noel, MD (Pulmonary Disease) Marylynn Pearson, MD (Obstetrics and Gynecology) Juanita Craver, MD (Gastroenterology) Erroll Luna, MD as Consulting Physician (General Surgery) Nicholas Lose, MD as Consulting Physician (Hematology and Oncology) Eppie Gibson, MD as Attending Physician (Radiation Oncology)  DIAGNOSIS: Breast cancer of upper-inner quadrant of right female breast [facility])   Staging form: Breast, AJCC 7th Edition     Clinical stage from 10/25/2015: Stage IA (T1c, N0, M0) - Unsigned       Staging comments: Staged at breast conference on 11.30.16      Pathologic stage from 11/17/2015: Stage IIA (T2, N0, cM0) - Unsigned       Staging comments: Staged on surgical specimen by Dr. 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 She complains of fatigue related to radiation as well as skin changes. She is trying to exercise more frequently and is able to run for short bursts. She is hoping to run a 5K there is sewn.  REVIEW OF SYSTEMS:   Constitutional: Denies fevers, chills or abnormal weight loss Eyes:  Denies blurriness of vision Ears, nose, mouth, throat, and face: Denies mucositis or sore throat Respiratory: Denies cough, dyspnea or wheezes Cardiovascular: Denies palpitation, chest discomfort Gastrointestinal:  Denies nausea, heartburn or change in bowel habits Skin: Denies abnormal skin rashes Lymphatics: Denies new lymphadenopathy or easy bruising Neurological:Denies numbness, tingling or new weaknesses Behavioral/Psych: Mood is stable, no new changes  Extremities: No lower extremity edema Breast: post radiation changes in the breast All other systems were reviewed with the patient and are negative.  I have reviewed the past medical history, past surgical history, social history and family history with the patient and they are unchanged from previous note.  ALLERGIES:  has No Known Allergies.  MEDICATIONS:  Current Outpatient Prescriptions  Medication Sig Dispense Refill  . citalopram (CELEXA) 20 MG tablet Take 1 tablet (20 mg total) by mouth daily. 90 tablet 3  . emollient (RADIAGEL) gel Apply topically as needed for wound care.    . eszopiclone (LUNESTA) 2 MG TABS tablet Take 1 tablet (2 mg total) by mouth at bedtime as needed for sleep. Take immediately before bedtime 90 tablet 0  . levothyroxine (SYNTHROID, LEVOTHROID) 75 MCG tablet Take 1 tablet (75 mcg total) by mouth daily before breakfast. 90 tablet 3  . non-metallic deodorant (ALRA) MISC Apply 1 application topically daily as needed. Reported on 01/22/2016    . oxyCODONE-acetaminophen (ROXICET) 5-325 MG tablet Take 1-2 tablets by mouth every 4 (four) hours as needed. (Patient not taking: Reported on 12/20/2015) 30 tablet 0   No current facility-administered medications for this visit.    PHYSICAL EXAMINATION:  ECOG PERFORMANCE STATUS: 1 - Symptomatic but completely ambulatory  Filed Vitals:   02/12/16 1405  BP: 113/63  Pulse: 75  Temp: 97.5 F (36.4 C)  Resp: 18   Filed Weights   02/12/16 1405  Weight: 141 lb  14.4 oz (64.365 kg)    GENERAL:alert, no distress and comfortable SKIN: skin color, texture, turgor are normal, no rashes or significant lesions EYES: normal, Conjunctiva are pink and non-injected, sclera clear OROPHARYNX:no exudate, no erythema and lips, buccal mucosa, and tongue normal  NECK: supple, thyroid normal size, non-tender, without nodularity LYMPH:  no palpable lymphadenopathy in the cervical, axillary or inguinal LUNGS: clear to auscultation and percussion with normal breathing effort HEART: regular rate & rhythm and no murmurs and no lower extremity edema ABDOMEN:abdomen soft, non-tender and normal bowel sounds MUSCULOSKELETAL:no cyanosis of digits and no clubbing  NEURO: alert & oriented x 3 with fluent speech, no focal motor/sensory deficits EXTREMITIES: No lower extremity edema  LABORATORY DATA:  I have reviewed the data as listed   Chemistry      Component Value Date/Time   NA 141 10/25/2015 1246   NA 135 06/29/2014 1044   K 4.0 10/25/2015 1246   K 4.0 06/29/2014 1044   CL 103 06/29/2014 1044   CO2 27 10/25/2015 1246   CO2 27 06/29/2014 1044   BUN 13.0 10/25/2015 1246   BUN 11 06/29/2014 1044   CREATININE 0.9 10/25/2015 1246   CREATININE 0.7 06/29/2014 1044      Component Value Date/Time   CALCIUM 9.7 10/25/2015 1246   CALCIUM 9.0 06/29/2014 1044   ALKPHOS 36* 10/25/2015 1246   ALKPHOS 21* 06/29/2014 1044   AST 22 10/25/2015 1246   AST 15 06/29/2014 1044   ALT 21 10/25/2015 1246   ALT 12 06/29/2014 1044   BILITOT 0.34 10/25/2015 1246   BILITOT 0.4 06/29/2014 1044     Lab Results  Component Value Date   WBC 4.6 10/25/2015   HGB 13.0 10/25/2015   HCT 39.5 10/25/2015   MCV 95.0 10/25/2015   PLT 282 10/25/2015   NEUTROABS 2.5 10/25/2015   ASSESSMENT & PLAN:  Breast cancer of upper-inner quadrant of right female breast ([place]) Right lumpectomy 11/16/2015: 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 These include but not limited to insomnia, hot flashes, mood changes, vaginal dryness, and myalgias. We strongly believe that the benefits far outweigh the risks. Patient understands these risks and consented to starting treatment. Planned treatment duration is 5 years. She will start this on April 1 week.  RTC in 3 months to assess tolerability to tamoxifen therapy.   No orders of the defined types were placed in this encounter.   The patient has a good understanding of the overall plan. she agrees with it. she will call with any problems that may develop before the next visit here.   Rulon Eisenmenger, MD 02/12/2016

## 2016-02-16 NOTE — Progress Notes (Signed)
  Radiation Oncology         (336) [phone] ________________________________  Name: Jeanne Parker MRN: [id number]  Date: 01/29/2016  DOB: 1961-06-19  End of Treatment Note   1. Breast cancer of upper-inner quadrant of right female breast ([place]) 174.2 C50.211     Stage II T2N0M0 Right Breast UIQ Invasive Ductal Carcinoma, ER+ / PR+ / Her2neg, Grade 1  Indication for treatment:  Curative       Radiation treatment dates:   01/02/2016-01/29/2016  Site/dose:   1. Right breast / 40.05 Gy in 15 fractions                      2. Right breast boost / 10 Gy in 5 fractions  Beams/energy:   1. 3D-Conformal / 6X                              2. Electron Monte Carlo / 9 MeV  Narrative: The patient tolerated radiation treatment relatively well.   She experienced mild fatigue and hyperpigmentation within the treatment field.  Plan: The patient has completed radiation treatment. The patient will return to radiation oncology clinic for routine followup in one month. I advised them to call or return sooner if they have any questions or concerns related to their recovery or treatment.  -----------------------------------  Eppie Gibson, MD   This document serves as a record of services personally performed by Eppie Gibson, MD. It was created on her behalf by Arlyce Harman, a trained medical scribe. The creation of this record is based on the scribe's personal observations and the provider's statements to them. This document has been checked and approved by the attending provider.

## 2016-03-01 ENCOUNTER — Encounter: Payer: Self-pay | Admitting: Nurse Practitioner

## 2016-03-01 ENCOUNTER — Ambulatory Visit (HOSPITAL_BASED_OUTPATIENT_CLINIC_OR_DEPARTMENT_OTHER): Payer: 59 | Admitting: Nurse Practitioner

## 2016-03-01 ENCOUNTER — Encounter: Payer: Self-pay | Admitting: Radiation Oncology

## 2016-03-01 ENCOUNTER — Ambulatory Visit
Admission: RE | Admit: 2016-03-01 | Discharge: 2016-03-01 | Disposition: A | Discharge status: Home / Self Care | Payer: 59 | Source: Ambulatory Visit | Attending: Radiation Oncology | Admitting: Radiation Oncology

## 2016-03-01 VITALS — BP 124/72 | HR 61 | Temp 97.7°F | Resp 20 | Ht 65.0 in | Wt 143.4 lb

## 2016-03-01 VITALS — BP 128/76 | HR 57 | Temp 97.5°F | Ht 65.0 in | Wt 142.1 lb

## 2016-03-01 DIAGNOSIS — C50211 Malignant neoplasm of upper-inner quadrant of right female breast: Secondary | ICD-10-CM

## 2016-03-01 DIAGNOSIS — Z170 Estrogen receptor positive status [ER+]: Secondary | ICD-10-CM

## 2016-03-01 NOTE — Progress Notes (Signed)
Radiation Oncology         (336) [phone] ________________________________  Name: Jeanne Parker MRN: [id number]  Date: 03/01/2016  DOB: Dec 04, 1960  Follow-Up Visit Note  Outpatient  CC: Gwendolyn Grant, MD  Nicholas Lose, MD  Diagnosis and Prior Radiotherapy:    ICD-9-CM ICD-10-CM   1. Breast cancer of upper-inner quadrant of right female breast ([place]) 174.2 C50.211     Invasive ductal carcinoma with DCIS of the right s/p lumpectomy, grade 1, T2 N0 stage II a, ER 95%, PR 80%, HER-2 negative, Ki-67 3%. Left breast with LCIS.  Radiation treatment dates:   01/02/2016-01/29/2016  Site/dose:   1. Right breast / 40.05 Gy in 15 fractions                      2. Right breast boost / 10 Gy in 5 fractions  Narrative:  The patient returns today for routine follow-up.  She is starting to build up her running routine, mainly exercising on the weekends.   She reports a decreased energy level which has improved slightly since completing radiation. She is still able to work. She reports the skin over her Right Breast is improved and she is currently using Vit E cream daily. She has not started the Tamoxifen, but will start 03/09/16.   ALLERGIES:  has No Known Allergies.  Meds: Current Outpatient Prescriptions  Medication Sig Dispense Refill  . aspirin 81 MG tablet Take 81 mg by mouth daily.    . citalopram (CELEXA) 20 MG tablet Take 1 tablet (20 mg total) by mouth daily. 90 tablet 3  . eszopiclone (LUNESTA) 2 MG TABS tablet Take 1 tablet (2 mg total) by mouth at bedtime as needed for sleep. Take immediately before bedtime 90 tablet 0  . levothyroxine (SYNTHROID, LEVOTHROID) 75 MCG tablet Take 1 tablet (75 mcg total) by mouth daily before breakfast. 90 tablet 3  . tamoxifen (NOLVADEX) 20 MG tablet Take 1 tablet (20 mg total) by mouth daily. (Patient not taking: Reported on 03/01/2016) 90 tablet 3   No current facility-administered medications for this encounter.    Physical Findings: The  patient is in no acute distress. Patient is alert and oriented.  height is _0  (1.651 m) and weight is 142 lb 1.6 oz (64.456 kg). Her temperature is 97.5 F (36.4 C). Her blood pressure is 128/76 and her pulse is 57. Marland Kitchen    General: Alert and oriented, in no acute distress   Psychiatric: Judgment and insight are intact. Affect is appropriate.  Breast: Slight hyperpigmentation over upper right breast, skin is intact and looks good   Lab Findings: Lab Results  Component Value Date   WBC 4.6 10/25/2015   HGB 13.0 10/25/2015   HCT 39.5 10/25/2015   MCV 95.0 10/25/2015   PLT 282 10/25/2015    Radiographic Findings: No results found.  Impression/Plan: Healing well from RT. Discussed FYNN series.   I encouraged her to continue followup with medical oncology. I will see her back on an as-needed basis. I have encouraged her to call if she has any issues or concerns in the future. I wished her the very best.  She plans to start Tamoxifen in about a week. We reviewed the benefits of this important medication. She is encouraged to continue exercising with running and weight lifting per her current routine which she is building back up  _____________________________________   Eppie Gibson, MD  This document serves as a record of services personally performed  by Eppie Gibson, MD. It was created on her behalf by Derek Mound, a trained medical scribe. The creation of this record is based on the scribe's personal observations and the provider's statements to them. This document has been checked and approved by the attending provider.

## 2016-03-01 NOTE — Progress Notes (Addendum)
Jeanne Parker is here for follow up of radiation completed 01/29/16 to her Right Breast. She reports a decreased energy level which has improved slightly since completing radiation. She is still able to work. She reports the skin over her Right Breast is improved and she is currently using Vit E cream daily. She has not started the Tamoxifin, but will start 03/09/16.   BP 128/76 mmHg  Pulse 57  Temp(Src) 97.5 F (36.4 C)  Ht 5\' 5"  (1.651 m)  Wt 142 lb 1.6 oz (64.456 kg)  BMI 23.65 kg/m2

## 2016-03-01 NOTE — Progress Notes (Signed)
CLINIC:  Cancer Survivorship   REASON FOR VISIT:  Routine follow-up post-treatment for a recent history of breast cancer.  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 Planned duration of therapy 5 years.    INTERVAL HISTORY:  [name] presents to the [facility] today for our initial meeting to review her survivorship care plan detailing her treatment course for breast cancer, as well as monitoring long-term side effects of that treatment, education regarding health maintenance, screening, and overall wellness and health promotion.     Overall, [name] reports feeling well since completing her radiation therapy approximately one month ago aside from the continued fatigue.  She is able to perform her daily activities, but still has  fatigue.  She is exercising predominantly on the weekends, as she is not yet able to return to her previous schedule. The skin changes overlying her right breast have improved. She denies any mass or lesion in either breast. She has not yet begun her tamoxifen and is planning to do so on 03/09/2016.  Her last menstrual cycle was last week and was normal for her.  She denies headache, cough, shortness of breath or bone pain.  She has a  good appetite and denies any weight loss.    REVIEW OF SYSTEMS:  General: Fatigue as above. Denies fever, chills, unintentional weight loss, or night sweats. HEENT: Denies visual changes, hearing loss, mouth sores or difficulty swallowing. Cardiac: Denies palpitations, chest pain, and lower extremity edema.  Respiratory: Denies wheeze or dyspnea on exertion.  Breast: As above, otherwise, denies any new nodularity, masses, tenderness, nipple changes, or nipple discharge.  GI: Denies abdominal pain, constipation, diarrhea, nausea, or vomiting.  GU: Denies dysuria, hematuria, vaginal bleeding, vaginal discharge, or vaginal dryness.  Musculoskeletal: Denies joint or bone pain.  Neuro: Denies recent fall or numbness / tingling in her extremities. Skin: Denies rash, pruritis, or open wounds.  Psych: Denies depression, anxiety, insomnia, or memory loss.   A 14-point review of systems was completed and was negative, except as noted above.   ONCOLOGY TREATMENT TEAM:  1. Surgeon:  Dr. Brantley Stage at [facility]  2. Medical Oncologist: Dr. Lindi Adie 3. Radiation Oncologist: Dr. Isidore Moos    PAST MEDICAL/SURGICAL HISTORY:  Past Medical History  Diagnosis Date  . Hypothyroid   . History of colon polyps   . History of chicken pox   . Anxiety     situational  . Breast cancer ([place])     R DCIS, on bx 10/11/15, s/p lumpectomy 11/16/15   Past Surgical History  Procedure Laterality Date  . Breast surgery  1998    fibroid adenoma   . Radioactive seed guided mastectomy with axillary sentinel lymph node biopsy Right 11/16/2015    Procedure: RADIOACTIVE SEED  GUIDED PARTIAL MASTECTOMY WITH AXILLARY SENTINEL LYMPH NODE BIOPSY;  Surgeon: Erroll Luna, MD;  Location: [facility];  Service: General;  Laterality: Right;     ALLERGIES:  No Known Allergies   CURRENT MEDICATIONS:  Current Outpatient Prescriptions on File Prior to Visit  Medication Sig Dispense Refill  .  citalopram (CELEXA) 20 MG tablet Take 1 tablet (20 mg total) by mouth daily. 90 tablet 3  . eszopiclone (LUNESTA) 2 MG TABS tablet Take 1 tablet (2 mg total) by mouth at bedtime as needed for sleep. Take immediately before bedtime 90 tablet 0  . levothyroxine (SYNTHROID, LEVOTHROID) 75 MCG tablet Take 1 tablet (75 mcg total) by mouth daily before breakfast. 90 tablet 3  . tamoxifen (NOLVADEX) 20 MG tablet Take 1 tablet (20 mg total) by mouth daily. (Patient not taking: Reported on 03/01/2016) 90 tablet 3   No current facility-administered medications on file prior to visit.     ONCOLOGIC FAMILY HISTORY:  Family History  Problem Relation Age of Onset  . Diabetes Father 66  . Hyperlipidemia Father   . Colon cancer Paternal Grandfather     died age 72  . Hypertension Father   . GER disease Father   . Hypertension Mother 53  . Hyperlipidemia Mother   . Coronary artery disease Father     angioplasty age 69s  . Atrial fibrillation Father     on coumadin     GENETIC COUNSELING/TESTING: No    SOCIAL HISTORY:  Jeanne Parker is divorced and lives with her family in [place], [place].  She has 3 children. 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  She denies any current or history of tobacco or illicit drug use.  She drinks 1-2 glasses of wine / weekly.   PHYSICAL EXAMINATION:  Vital Signs: Filed Vitals:   03/01/16 1036  BP: 124/72  Pulse: 61  Temp: 97.7 F (36.5 C)  Resp: 20   ECOG Performance Status: 0  General: Well-nourished, well-appearing female in no acute distress.  She is unaccompanied in clinic today.   HEENT: Head is atraumatic and normocephalic.  Pupils equal and reactive to light and accomodation. Conjunctivae clear without exudate.  Sclerae anicteric. Oral mucosa is pink, moist, and intact without lesions.  Oropharynx is pink without lesions or erythema.  Lymph: No cervical, supraclavicular, infraclavicular, or axillary  lymphadenopathy noted on palpation.  Cardiovascular: Regular rate and rhythm without murmurs, rubs, or gallops. Respiratory: Clear to auscultation bilaterally. Chest expansion symmetric without accessory muscle use on inspiration or expiration.  GI: Abdomen soft and round. No tenderness to palpation. Bowel sounds normoactive in 4 quadrants. GU: Deferred.  Neuro: No focal deficits. Steady gait.  Psych: Mood and affect normal and appropriate for situation.  Extremities: No edema, cyanosis, or clubbing.  Skin: Warm and dry. No open lesions noted.   LABORATORY DATA:  None for this visit.  DIAGNOSTIC IMAGING:  None for this visit.     ASSESSMENT AND PLAN:   1. 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  [name] is doing well without clinical symptoms worrisome for disease recurrence. She will follow-up with her medical oncologist,  Dr. Lindi Adie, in June 2017 with history and physical examination per surveillance protocol.  She will see her radiation  oncologist, Dr. Isidore Moos, immediately following our appointment today. She will begin her anti-estrogen therapy with tamoxifen in two weeks and was instructed to make [place] aware if she begins to experience any side effects of the medication and I could see her back in clinic to help manage those side effects, as needed. Though the incidence is low, there is an associated risk of endometrial cancer with anti-estrogen therapies like Tamoxifen.  [name] was encouraged to contact us with any vaginal bleeding while taking Tamoxifen. Other side effects of Tamoxifen were again reviewed with her as well. A comprehensive survivorship care plan and treatment summary was reviewed with the patient today  detailing her breast cancer diagnosis, treatment course, potential late/long-term effects of treatment, appropriate follow-up care with recommendations for the future, and patient education resources.  A copy of this summary, along with a letter will be sent to the patient's primary care provider via in basket message after today's visit.  [name] is welcome to return to the Survivorship Clinic in the future, as needed; no follow-up will be scheduled at this time.    2. Cancer screening:  Due to Ms. Fitzgerald's history and her age, she should receive screening for skin cancers, colon cancer, and gynecologic cancers.  The information and recommendations are listed on the patient's comprehensive care plan/treatment summary and were reviewed in detail with the patient.    3. Health maintenance and wellness promotion: [name] was encouraged to consume 5-7 servings of fruits and vegetables per day. We reviewed the "Nutrition Rainbow" handout, as well as discussed recommendations to maximize nutrition and minimize recurrence, such as increased intake of fruits, vegetables, lean proteins, and minimizing the intake of red meats and processed foods.  She was also encouraged to engage in moderate to vigorous exercise for 30 minutes per day most days of the week. We discussed the [employer] fitness program, which is designed for cancer survivors to help them become more physically fit after cancer treatments.  She was instructed to limit her alcohol consumption and continue to abstain from tobacco use.  A copy of the "Take Control of Your Health" brochure was given to her reinforcing these recommendations.   4. Support services/counseling: It is not uncommon for this period of the patient's cancer care trajectory to be one of many emotions and stressors.  We discussed an opportunity for her to participate in the next session of [facility] ("Finding Your New Normal") support group series designed for  patients after they have completed treatment.  [name] was encouraged to take advantage of our many other support services programs, support groups, and/or counseling in coping with her new life as a cancer survivor after completing anti-cancer treatment. She was offered support today through active listening and expressive supportive counseling. She was given information regarding our available services and encouraged to contact me with any questions or for help enrolling in any of our support group/programs.    A total of 60 minutes of face-to-face time was spent with this patient with greater than 50% of that time in counseling and care-coordination.   Sylvan Cheese, NP  Survivorship Program [facility] [phone]   Note: PRIMARY CARE PROVIDER Jeanne Parker, [place] [phone]

## 2016-03-08 NOTE — Progress Notes (Signed)
Electron Boost CT simulation / Treatment Planning Note C50.211 diagnosis 01/22/16 Diagnosis: Breast Cancer  The patient's CT images from her free-breathing simulation were reviewed to plan her boost treatment to her right breast  lumpectomy cavity.  The boost to the lumpectomy cavity will be delivered with 9 MeV electrons, with an en face field, and custom electron cut out block.Complex isodose plan approved.  10 Gy in 5 fractions has been prescribed. -----------------------------------  Eppie Gibson, MD

## 2016-05-14 ENCOUNTER — Encounter: Payer: Self-pay | Admitting: Hematology and Oncology

## 2016-05-14 ENCOUNTER — Ambulatory Visit: Payer: Self-pay | Admitting: Hematology and Oncology

## 2016-05-14 ENCOUNTER — Ambulatory Visit (HOSPITAL_BASED_OUTPATIENT_CLINIC_OR_DEPARTMENT_OTHER): Payer: 59 | Admitting: Hematology and Oncology

## 2016-05-14 ENCOUNTER — Telehealth: Payer: Self-pay | Admitting: Hematology and Oncology

## 2016-05-14 VITALS — BP 125/81 | HR 74 | Temp 98.0°F | Resp 18 | Wt 141.8 lb

## 2016-05-14 DIAGNOSIS — C50211 Malignant neoplasm of upper-inner quadrant of right female breast: Secondary | ICD-10-CM | POA: Diagnosis not present

## 2016-05-14 NOTE — Telephone Encounter (Signed)
appt made and avs printed °

## 2016-05-14 NOTE — Progress Notes (Signed)
Patient Care Team: Rowe Clack, MD as PCP - General (Internal Medicine) Rigoberto Noel, MD (Pulmonary Disease) Marylynn Pearson, MD (Obstetrics and Gynecology) Juanita Craver, MD (Gastroenterology) Erroll Luna, MD as Consulting Physician (General Surgery) Nicholas Lose, MD as Consulting Physician (Hematology and Oncology) Eppie Gibson, MD as Attending Physician (Radiation Oncology) Sylvan Cheese, NP as Nurse Practitioner (Hematology and Oncology)  DIAGNOSIS: Breast cancer of upper-inner quadrant of right female breast [facility])   Staging form: Breast, AJCC 7th Edition     Clinical stage from 10/25/2015: Stage IA (T1c, N0, M0) - Unsigned       Staging comments: Staged at breast conference on 11.30.16      Pathologic stage from 11/17/2015: Stage IIA (T2, N0, cM0) - Unsigned       Staging comments: Staged on surgical specimen by Dr. 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 Planned duration of therapy 5 years.   03/01/2016 Survivorship Survivorship visit completed    CHIEF COMPLIANT: Tolerating tamoxifen extremely well  INTERVAL HISTORY: Jeanne Parker is a 55 year old with above-mentioned history of right breast cancer treated with lumpectomy and  adjuvant radiation therapy. She is currently on tamoxifen and appears to be tolerating it extremely well. She does have occasional sweats and hot flashes. These are interfering with her life. Denies any myalgias or arthralgias. She is still quite anxious about recurrence of breast cancer.  REVIEW OF SYSTEMS:   Constitutional: Denies fevers, chills or abnormal weight loss Eyes: Denies blurriness of vision Ears, nose, mouth, throat, and face: Denies mucositis or sore throat Respiratory: Denies cough, dyspnea or wheezes Cardiovascular: Denies palpitation, chest discomfort Gastrointestinal:  Denies nausea, heartburn or change in bowel habits Skin: Denies abnormal skin rashes Lymphatics: Denies new lymphadenopathy or easy bruising Neurological:Denies numbness, tingling or new weaknesses Behavioral/Psych: Mood is stable, no new changes  Extremities: No lower extremity edema Breast:  denies any pain or lumps or nodules in either breasts All other systems were reviewed with the patient and are negative.  I have reviewed the past medical history, past surgical history, social history and family history with the patient and they are unchanged from previous note.  ALLERGIES:  has No Known Allergies.  MEDICATIONS:  Current Outpatient Prescriptions  Medication Sig Dispense Refill  . aspirin 81 MG tablet Take 81 mg by mouth daily.    . citalopram (CELEXA) 20 MG tablet Take 1 tablet (20 mg total) by mouth daily. 90 tablet 3  . eszopiclone (LUNESTA) 2 MG TABS tablet Take 1 tablet (2 mg total) by mouth at bedtime as needed for sleep. Take immediately before bedtime 90 tablet 0  . levothyroxine (SYNTHROID, LEVOTHROID) 75 MCG tablet Take 1 tablet (75 mcg total) by mouth daily before breakfast. 90  tablet 3  . tamoxifen (NOLVADEX) 20 MG tablet Take 1 tablet (20 mg total) by mouth daily. (Patient not taking: Reported on 03/01/2016) 90 tablet 3   No current facility-administered medications for this visit.     PHYSICAL EXAMINATION: ECOG PERFORMANCE STATUS: 0 - Asymptomatic  Filed Vitals:   05/14/16 0907  BP: 125/81  Pulse: 74  Temp: 98 F (36.7 C)  Resp: 18   Filed Weights   05/14/16 0907  Weight: 141 lb 12.8 oz (64.32 kg)    GENERAL:alert, no distress and comfortable SKIN: skin color, texture, turgor are normal, no rashes or significant lesions EYES: normal, Conjunctiva are pink and non-injected, sclera clear OROPHARYNX:no exudate, no erythema and lips, buccal mucosa, and tongue normal  NECK: supple, thyroid normal size, non-tender, without nodularity LYMPH:  no palpable lymphadenopathy in the cervical, axillary or inguinal LUNGS: clear to auscultation and percussion with normal breathing effort HEART: regular rate & rhythm and no murmurs and no lower extremity edema ABDOMEN:abdomen soft, non-tender and normal bowel sounds MUSCULOSKELETAL:no cyanosis of digits and no clubbing  NEURO: alert & oriented x 3 with fluent speech, no focal motor/sensory deficits EXTREMITIES: No lower extremity edema   LABORATORY DATA:  I have reviewed the data as listed   Chemistry      Component Value Date/Time   NA 141 10/25/2015 1246   NA 135 06/29/2014 1044   K 4.0 10/25/2015 1246   K 4.0 06/29/2014 1044   CL 103 06/29/2014 1044   CO2 27 10/25/2015 1246   CO2 27 06/29/2014 1044   BUN 13.0 10/25/2015 1246   BUN 11 06/29/2014 1044   CREATININE 0.9 10/25/2015 1246   CREATININE 0.7 06/29/2014 1044      Component Value Date/Time   CALCIUM 9.7 10/25/2015 1246   CALCIUM 9.0 06/29/2014 1044   ALKPHOS 36* 10/25/2015 1246   ALKPHOS 21* 06/29/2014 1044   AST 22 10/25/2015 1246   AST 15 06/29/2014 1044   ALT 21 10/25/2015 1246   ALT 12 06/29/2014 1044   BILITOT 0.34 10/25/2015 1246   BILITOT 0.4 06/29/2014 1044       Lab Results  Component Value Date   WBC 4.6 10/25/2015   HGB 13.0 10/25/2015   HCT 39.5 10/25/2015   MCV 95.0 10/25/2015   PLT 282 10/25/2015   NEUTROABS 2.5  10/25/2015     ASSESSMENT & PLAN:  Breast cancer of upper-inner quadrant of right female breast ([place]) Right lumpectomy 11/16/2015: Invasive ductal carcinoma 2.7 cm with DCIS, grade 1, left, LCIS, 0/4 lymph nodes negative, T2 N0 stage II a, ER 95%, PR 80%, HER-2 negative, Ki-67 3%, Oncotype Dx 11 (7% ROR) Adj XRT completed 01/29/16  Treatment: Anti-estrogen therapy with tamoxifen 20 mg daily started 02/24/2016 Tamoxifen toxicities: 1. Occasional hot flashes and sweats  Return to clinic in 6 months for follow-up breast exams. Patient will need a mammogram once again in November 2017.    No orders of the defined types were placed in this encounter.   The patient has a good understanding of the overall plan. she agrees with it. she will call with any problems that may develop before the next visit here.   Rulon Eisenmenger, MD 05/14/2016

## 2016-05-14 NOTE — Assessment & Plan Note (Signed)
Right lumpectomy 11/16/2015: Invasive ductal carcinoma 2.7 cm with DCIS, grade 1, left, LCIS, 0/4 lymph nodes negative, T2 N0 stage II a, ER 95%, PR 80%, HER-2 negative, Ki-67 3%, Oncotype Dx 11 (7% ROR) Adj XRT completed 01/29/16  Treatment: Anti-estrogen therapy with tamoxifen 20 mg daily started 02/24/2016 Tamoxifen toxicities: 1. Occasional hot flashes and sweats  Return to clinic in 6 months for follow-up breast exams. Patient will need a mammogram once again in November 2017.

## 2016-07-10 ENCOUNTER — Other Ambulatory Visit: Payer: Self-pay | Admitting: Internal Medicine

## 2016-07-10 ENCOUNTER — Telehealth: Payer: Self-pay | Admitting: *Deleted

## 2016-07-10 MED ORDER — ESZOPICLONE 2 MG PO TABS: 2.0000 mg | ORAL_TABLET | Freq: Every evening | ORAL | 0 refills | Status: DC | PRN

## 2016-07-10 NOTE — Telephone Encounter (Signed)
Rec'd call pt states she needing refill on her lunesta. Already schedule to see Dr. Quay Burow but CPX is not until Dec.../lmb

## 2016-07-10 NOTE — Telephone Encounter (Signed)
Rec'd fax pt wanting refill on Lunesta. Sent back stating  Dr/ leschber no loner here pt need to establish w/new provider for refills...Johny Chess

## 2016-07-11 NOTE — Telephone Encounter (Signed)
Notified pt rx fax to [facility]...Johny Chess

## 2016-09-06 ENCOUNTER — Other Ambulatory Visit: Payer: Self-pay | Admitting: Surgery

## 2016-09-06 DIAGNOSIS — Z853 Personal history of malignant neoplasm of breast: Secondary | ICD-10-CM

## 2016-09-10 ENCOUNTER — Ambulatory Visit (INDEPENDENT_AMBULATORY_CARE_PROVIDER_SITE_OTHER): Payer: 59

## 2016-09-10 DIAGNOSIS — Z23 Encounter for immunization: Secondary | ICD-10-CM | POA: Diagnosis not present

## 2016-09-30 ENCOUNTER — Ambulatory Visit
Admission: RE | Admit: 2016-09-30 | Discharge: 2016-09-30 | Disposition: A | Discharge status: Home / Self Care | Payer: 59 | Source: Ambulatory Visit | Attending: Surgery | Admitting: Surgery

## 2016-09-30 DIAGNOSIS — Z853 Personal history of malignant neoplasm of breast: Secondary | ICD-10-CM

## 2016-11-06 ENCOUNTER — Other Ambulatory Visit (INDEPENDENT_AMBULATORY_CARE_PROVIDER_SITE_OTHER): Payer: 59

## 2016-11-06 ENCOUNTER — Encounter: Payer: Self-pay | Admitting: Internal Medicine

## 2016-11-06 ENCOUNTER — Ambulatory Visit (INDEPENDENT_AMBULATORY_CARE_PROVIDER_SITE_OTHER): Payer: 59 | Admitting: Internal Medicine

## 2016-11-06 VITALS — BP 124/80 | HR 108 | Temp 97.8°F | Resp 16 | Ht 65.0 in | Wt 138.0 lb

## 2016-11-06 DIAGNOSIS — Z0000 Encounter for general adult medical examination without abnormal findings: Secondary | ICD-10-CM

## 2016-11-06 DIAGNOSIS — E559 Vitamin D deficiency, unspecified: Secondary | ICD-10-CM

## 2016-11-06 DIAGNOSIS — E039 Hypothyroidism, unspecified: Secondary | ICD-10-CM

## 2016-11-06 DIAGNOSIS — G4700 Insomnia, unspecified: Secondary | ICD-10-CM

## 2016-11-06 DIAGNOSIS — F411 Generalized anxiety disorder: Secondary | ICD-10-CM

## 2016-11-06 LAB — COMPREHENSIVE METABOLIC PANEL
ALT: 21 U/L (ref 0–35)
AST: 22 U/L (ref 0–37)
Albumin: 4.4 g/dL (ref 3.5–5.2)
Alkaline Phosphatase: 27 U/L — ABNORMAL LOW (ref 39–117)
BUN: 15 mg/dL (ref 6–23)
CO2: 28 mEq/L (ref 19–32)
Calcium: 9.5 mg/dL (ref 8.4–10.5)
Chloride: 104 mEq/L (ref 96–112)
Creatinine, Ser: 0.76 mg/dL (ref 0.40–1.20)
GFR: 83.66 mL/min (ref >60.00)
Glucose, Bld: 102 mg/dL — ABNORMAL HIGH (ref 70–99)
Potassium: 4.5 mEq/L (ref 3.5–5.1)
Sodium: 140 mEq/L (ref 135–145)
Total Bilirubin: 0.4 mg/dL (ref 0.2–1.2)
Total Protein: 6.9 g/dL (ref 6.0–8.3)

## 2016-11-06 LAB — CBC WITH DIFFERENTIAL/PLATELET
Basophils Absolute: 0 10*3/uL (ref 0.0–0.1)
Basophils Relative: 0.6 % (ref 0.0–3.0)
Eosinophils Absolute: 0.2 10*3/uL (ref 0.0–0.7)
Eosinophils Relative: 7.1 % — ABNORMAL HIGH (ref 0.0–5.0)
HCT: 37.6 % (ref 36.0–46.0)
Hemoglobin: 12.7 g/dL (ref 12.0–15.0)
Lymphocytes Relative: 29 % (ref 12.0–46.0)
Lymphs Abs: 0.9 10*3/uL (ref 0.7–4.0)
MCHC: 33.8 g/dL (ref 30.0–36.0)
MCV: 92.5 fl (ref 78.0–100.0)
Monocytes Absolute: 0.4 10*3/uL (ref 0.1–1.0)
Monocytes Relative: 12.7 % — ABNORMAL HIGH (ref 3.0–12.0)
Neutro Abs: 1.6 10*3/uL (ref 1.4–7.7)
Neutrophils Relative %: 50.6 % (ref 43.0–77.0)
Platelets: 260 10*3/uL (ref 150.0–400.0)
RBC: 4.07 Mil/uL (ref 3.87–5.11)
RDW: 14 % (ref 11.5–15.5)
WBC: 3.1 10*3/uL — ABNORMAL LOW (ref 4.0–10.5)

## 2016-11-06 LAB — TSH: TSH: 1.69 u[IU]/mL (ref 0.35–4.50)

## 2016-11-06 LAB — LIPID PANEL
Cholesterol: 236 mg/dL — ABNORMAL HIGH (ref 0–200)
HDL: 70.3 mg/dL
LDL Cholesterol: 152 mg/dL — ABNORMAL HIGH (ref 0–99)
NonHDL: 166.11
Total CHOL/HDL Ratio: 3
Triglycerides: 69 mg/dL (ref 0.0–149.0)
VLDL: 13.8 mg/dL (ref 0.0–40.0)

## 2016-11-06 LAB — VITAMIN D 25 HYDROXY (VIT D DEFICIENCY, FRACTURES): VITD: 35.98 ng/mL (ref 30.00–100.00)

## 2016-11-06 MED ORDER — MULTIVITAMINS PO CAPS: 1.0000 | ORAL_CAPSULE | Freq: Every day | ORAL | Status: AC

## 2016-11-06 NOTE — Assessment & Plan Note (Signed)
Taking vitamin D  Check level

## 2016-11-06 NOTE — Assessment & Plan Note (Addendum)
Never uses Lunesta, but has it just in case Sleep issues were related to hormones and since being on tamoxifen the sleep issues have resolved

## 2016-11-06 NOTE — Progress Notes (Signed)
Pre visit review using our clinic review tool, if applicable. No additional management support is needed unless otherwise documented below in the visit note. 

## 2016-11-06 NOTE — Patient Instructions (Addendum)
Test(s) ordered today. Your results will be released to [facility] (or called to you) after review, usually within 72hours after test completion. If any changes need to be made, you will be notified at that same time.  All other Health Maintenance issues reviewed.   All recommended immunizations and age-appropriate screenings are up-to-date or discussed.  No immunizations administered today.   Medications reviewed and updated.  No changes recommended at this time.   Please followup in one year for a physical   Health Maintenance, Female Introduction Adopting a healthy lifestyle and getting preventive care can go a long way to promote health and wellness. Talk with your health care provider about what schedule of regular examinations is right for you. This is a good chance for you to check in with your provider about disease prevention and staying healthy. In between checkups, there are plenty of things you can do on your own. Experts have done a lot of research about which lifestyle changes and preventive measures are most likely to keep you healthy. Ask your health care provider for more information. Weight and diet Eat a healthy diet  Be sure to include plenty of vegetables, fruits, low-fat dairy products, and lean protein.  Do not eat a lot of foods high in solid fats, added sugars, or salt.  Get regular exercise. This is one of the most important things you can do for your health.  Most adults should exercise for at least 150 minutes each week. The exercise should increase your heart rate and make you sweat (moderate-intensity exercise).  Most adults should also do strengthening exercises at least twice a week. This is in addition to the moderate-intensity exercise. Maintain a healthy weight  Body mass index (BMI) is a measurement that can be used to identify possible weight problems. It estimates body fat based on height and weight. Your health care provider can help determine your  BMI and help you achieve or maintain a healthy weight.  For females 16 years of age and older:  A BMI below 18.5 is considered underweight.  A BMI of 18.5 to 24.9 is normal.  A BMI of 25 to 29.9 is considered overweight.  A BMI of 30 and above is considered obese. Watch levels of cholesterol and blood lipids  You should start having your blood tested for lipids and cholesterol at 56 years of age, then have this test every 5 years.  You may need to have your cholesterol levels checked more often if:  Your lipid or cholesterol levels are high.  You are older than 56 years of age.  You are at high risk for heart disease. Cancer screening Lung Cancer  Lung cancer screening is recommended for adults 27-56 years old who are at high risk for lung cancer because of a history of smoking.  A yearly low-dose CT scan of the lungs is recommended for people who:  Currently smoke.  Have quit within the past 15 years.  Have at least a 30-pack-year history of smoking. A pack year is smoking an average of one pack of cigarettes a day for 1 year.  Yearly screening should continue until it has been 15 years since you quit.  Yearly screening should stop if you develop a health problem that would prevent you from having lung cancer treatment. Breast Cancer  Practice breast self-awareness. This means understanding how your breasts normally appear and feel.  It also means doing regular breast self-exams. Let your health care provider know about any changes,  no matter how small.  If you are in your 20s or 30s, you should have a clinical breast exam (CBE) by a health care provider every 1-3 years as part of a regular health exam.  If you are 20 or older, have a CBE every year. Also consider having a breast X-ray (mammogram) every year.  If you have a family history of breast cancer, talk to your health care provider about genetic screening.  If you are at high risk for breast cancer, talk to  your health care provider about having an MRI and a mammogram every year.  Breast cancer gene (BRCA) assessment is recommended for women who have family members with BRCA-related cancers. BRCA-related cancers include:  Breast.  Ovarian.  Tubal.  Peritoneal cancers.  Results of the assessment will determine the need for genetic counseling and BRCA1 and BRCA2 testing. Cervical Cancer  Your health care provider may recommend that you be screened regularly for cancer of the pelvic organs (ovaries, uterus, and vagina). This screening involves a pelvic examination, including checking for microscopic changes to the surface of your cervix (Pap test). You may be encouraged to have this screening done every 3 years, beginning at age 76.  For women ages 74-65, health care providers may recommend pelvic exams and Pap testing every 3 years, or they may recommend the Pap and pelvic exam, combined with testing for human papilloma virus (HPV), every 5 years. Some types of HPV increase your risk of cervical cancer. Testing for HPV may also be done on women of any age with unclear Pap test results.  Other health care providers may not recommend any screening for nonpregnant women who are considered low risk for pelvic cancer and who do not have symptoms. Ask your health care provider if a screening pelvic exam is right for you.  If you have had past treatment for cervical cancer or a condition that could lead to cancer, you need Pap tests and screening for cancer for at least 20 years after your treatment. If Pap tests have been discontinued, your risk factors (such as having a new sexual partner) need to be reassessed to determine if screening should resume. Some women have medical problems that increase the chance of getting cervical cancer. In these cases, your health care provider may recommend more frequent screening and Pap tests. Colorectal Cancer  This type of cancer can be detected and often  prevented.  Routine colorectal cancer screening usually begins at 56 years of age and continues through 56 years of age.  Your health care provider may recommend screening at an earlier age if you have risk factors for colon cancer.  Your health care provider may also recommend using home test kits to check for hidden blood in the stool.  A small camera at the end of a tube can be used to examine your colon directly (sigmoidoscopy or colonoscopy). This is done to check for the earliest forms of colorectal cancer.  Routine screening usually begins at age 47.  Direct examination of the colon should be repeated every 5-10 years through 56 years of age. However, you may need to be screened more often if early forms of precancerous polyps or small growths are found. Skin Cancer  Check your skin from head to toe regularly.  Tell your health care provider about any new moles or changes in moles, especially if there is a change in a mole's shape or color.  Also tell your health care provider if you have a  mole that is larger than the size of a pencil eraser.  Always use sunscreen. Apply sunscreen liberally and repeatedly throughout the day.  Protect yourself by wearing long sleeves, pants, a wide-brimmed hat, and sunglasses whenever you are outside. Heart disease, diabetes, and high blood pressure  High blood pressure causes heart disease and increases the risk of stroke. High blood pressure is more likely to develop in:  People who have blood pressure in the high end of the normal range (130-139/85-89 mm Hg).  People who are overweight or obese.  People who are African American.  If you are 74-34 years of age, have your blood pressure checked every 3-5 years. If you are 47 years of age or older, have your blood pressure checked every year. You should have your blood pressure measured twice-once when you are at a hospital or clinic, and once when you are not at a hospital or clinic. Record  the average of the two measurements. To check your blood pressure when you are not at a hospital or clinic, you can use:  An automated blood pressure machine at a pharmacy.  A home blood pressure monitor.  If you are between 30 years and 70 years old, ask your health care provider if you should take aspirin to prevent strokes.  Have regular diabetes screenings. This involves taking a blood sample to check your fasting blood sugar level.  If you are at a normal weight and have a low risk for diabetes, have this test once every three years after 56 years of age.  If you are overweight and have a high risk for diabetes, consider being tested at a younger age or more often. Preventing infection Hepatitis B  If you have a higher risk for hepatitis B, you should be screened for this virus. You are considered at high risk for hepatitis B if:  You were born in a country where hepatitis B is common. Ask your health care provider which countries are considered high risk.  Your parents were born in a high-risk country, and you have not been immunized against hepatitis B (hepatitis B vaccine).  You have HIV or AIDS.  You use needles to inject street drugs.  You live with someone who has hepatitis B.  You have had sex with someone who has hepatitis B.  You get hemodialysis treatment.  You take certain medicines for conditions, including cancer, organ transplantation, and autoimmune conditions. Hepatitis C  Blood testing is recommended for:  Everyone born from [birth year] through 1966.  Anyone with known risk factors for hepatitis C. Sexually transmitted infections (STIs)  You should be screened for sexually transmitted infections (STIs) including gonorrhea and chlamydia if:  You are sexually active and are younger than 56 years of age.  You are older than 56 years of age and your health care provider tells you that you are at risk for this type of infection.  Your sexual activity has  changed since you were last screened and you are at an increased risk for chlamydia or gonorrhea. Ask your health care provider if you are at risk.  If you do not have HIV, but are at risk, it may be recommended that you take a prescription medicine daily to prevent HIV infection. This is called pre-exposure prophylaxis (PrEP). You are considered at risk if:  You are sexually active and do not regularly use condoms or know the HIV status of your partner(s).  You take drugs by injection.  You are sexually active  active with a partner who has HIV. Talk with your health care provider about whether you are at high risk of being infected with HIV. If you choose to begin PrEP, you should first be tested for HIV. You should then be tested every 3 months for as long as you are taking PrEP. Pregnancy  If you are premenopausal and you may become pregnant, ask your health care provider about preconception counseling.  If you may become pregnant, take 400 to 800 micrograms (mcg) of folic acid every day.  If you want to prevent pregnancy, talk to your health care provider about birth control (contraception). Osteoporosis and menopause  Osteoporosis is a disease in which the bones lose minerals and strength with aging. This can result in serious bone fractures. Your risk for osteoporosis can be identified using a bone density scan.  If you are 65 years of age or older, or if you are at risk for osteoporosis and fractures, ask your health care provider if you should be screened.  Ask your health care provider whether you should take a calcium or vitamin D supplement to lower your risk for osteoporosis.  Menopause may have certain physical symptoms and risks.  Hormone replacement therapy may reduce some of these symptoms and risks. Talk to your health care provider about whether hormone replacement therapy is right for you. Follow these instructions at home:  Schedule regular health, dental, and eye  exams.  Stay current with your immunizations.  Do not use any tobacco products including cigarettes, chewing tobacco, or electronic cigarettes.  If you are pregnant, do not drink alcohol.  If you are breastfeeding, limit how much and how often you drink alcohol.  Limit alcohol intake to no more than 1 drink per day for nonpregnant women. One drink equals 12 ounces of beer, 5 ounces of wine, or 1 ounces of hard liquor.  Do not use street drugs.  Do not share needles.  Ask your health care provider for help if you need support or information about quitting drugs.  Tell your health care provider if you often feel depressed.  Tell your health care provider if you have ever been abused or do not feel safe at home. This information is not intended to replace advice given to you by your health care provider. Make sure you discuss any questions you have with your health care provider. Document Released: 05/27/2011 Document Revised: 04/18/2016 Document Reviewed: 08/15/2015  2017 Elsevier    

## 2016-11-06 NOTE — Assessment & Plan Note (Signed)
Check tsh  Titrate med dose if needed  

## 2016-11-06 NOTE — Progress Notes (Signed)
Subjective:    Patient ID: Jeanne Parker, female    DOB: Apr 18, 1961, 55 y.o.   MRN: [id number]  HPI She is here to establish with a new pcp.  She is here for a physical exam.   Breast cancer, right:  She was treated with a lumpectomy and adjuvant radiation.  She was started on tamoxifen 02/2016 and has occasional hot flashes and sweats.   Hypothyroidism:  She is taking her medication daily.  She denies any recent changes in energy or weight that are unexplained.   Anxiety: She is taking her medication daily as prescribed. She denies any side effects from the medication. She feels her anxiety is well controlled and she is happy with her current dose of medication.  She is unsure if she still needs the medication.    Medications and allergies reviewed with patient and updated if appropriate.  Patient Active Problem List   Diagnosis Date Noted  . Breast cancer of upper-inner quadrant of right female breast ([facility]) 10/16/2015  . Generalized anxiety disorder 01/04/2013  . Hypothyroidism   . Insomnia 01/20/2012  . Snoring 01/20/2012    Current Outpatient Prescriptions on File Prior to Visit  Medication Sig Dispense Refill  . aspirin 81 MG tablet Take 81 mg by mouth daily.    . citalopram (CELEXA) 20 MG tablet Take 1 tablet (20 mg total) by mouth daily. 90 tablet 3  . eszopiclone (LUNESTA) 2 MG TABS tablet Take 1 tablet (2 mg total) by mouth at bedtime as needed for sleep. Take immediately before bedtime 90 tablet 0  . levothyroxine (SYNTHROID, LEVOTHROID) 75 MCG tablet Take 1 tablet (75 mcg total) by mouth daily before breakfast. 90 tablet 3  . tamoxifen (NOLVADEX) 20 MG tablet Take 1 tablet (20 mg total) by mouth daily. 90 tablet 3   No current facility-administered medications on file prior to visit.     Past Medical History:  Diagnosis Date  . Anxiety    situational  . 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 History of chicken pox   .  History of colon polyps   . Hypothyroid     Past Surgical History:  Procedure Laterality Date  . BREAST SURGERY  1998   fibroid adenoma   . RADIOACTIVE SEED GUIDED MASTECTOMY WITH AXILLARY SENTINEL LYMPH NODE BIOPSY Right 11/16/2015   Procedure: RADIOACTIVE SEED GUIDED PARTIAL MASTECTOMY WITH AXILLARY SENTINEL LYMPH NODE BIOPSY;  Surgeon: Erroll Luna, MD;  Location: [facility];  Service: General;  Laterality: Right;    Social History   Social History  . Marital status: Divorced    Spouse name: N/A  . Number of children: 3  . Years of education: N/A   Occupational History  . [employer]   Social History Main Topics  . Smoking status: Never Smoker  . Smokeless tobacco: Never Used  . Alcohol use Yes     Comment: 1 to 2 glasses of wine a week   . Drug use: No  . Sexual activity: Not on file   Other Topics Concern  . Not on file   Social History Narrative  . No narrative on file    Family History  Problem Relation Age of Onset  . Diabetes Father 52  . Hyperlipidemia Father   . Colon cancer Paternal Grandfather     died age 24  . Hypertension Father   .  GER disease Father   . Hypertension Mother 80  . Hyperlipidemia Mother   . Coronary artery disease Father     angioplasty age 68s  . Atrial fibrillation Father     on coumadin    Review of Systems  Constitutional: Negative for appetite change, chills, fatigue, fever and unexpected weight change.  HENT: Negative for hearing loss and tinnitus.   Eyes: Negative for visual disturbance.  Respiratory: Negative for cough, shortness of breath and wheezing.   Cardiovascular: Negative for chest pain, palpitations and leg swelling.  Gastrointestinal: Negative for abdominal pain, blood in stool, constipation, diarrhea and nausea.       No gerd  Genitourinary: Negative for dysuria and hematuria.  Musculoskeletal: Positive for neck stiffness (with stresss/tension). Negative  for arthralgias and back pain.  Skin: Negative for color change and rash.  Neurological: Negative for dizziness, light-headedness and headaches.  Psychiatric/Behavioral: Negative for dysphoric mood. The patient is not nervous/anxious.        Objective:   Vitals:   11/06/16 0813  BP: 124/80  Pulse: (!) 108  Resp: 16  Temp: 97.8 F (36.6 C)   Filed Weights   11/06/16 0813  Weight: 138 lb (62.6 kg)   Body mass index is 22.96 kg/m.   Physical Exam Constitutional: She appears well-developed and well-nourished. No distress.  HENT:  Head: Normocephalic and atraumatic.  Right Ear: External ear normal. Normal ear canal and TM Left Ear: External ear normal.  Normal ear canal and TM Mouth/Throat: Oropharynx is clear and moist.  Eyes: Conjunctivae and EOM are normal.  Neck: Neck supple. No tracheal deviation present. No thyromegaly present.  No carotid bruit  Cardiovascular: Normal rate, regular rhythm and normal heart sounds.   No murmur heard.  No edema. Pulmonary/Chest: Effort normal and breath sounds normal. No respiratory distress. She has no wheezes. She has no rales.  Breast: deferred to Gyn Abdominal: Soft. She exhibits no distension. There is no tenderness.  Lymphadenopathy: She has no cervical adenopathy.  Skin: Skin is warm and dry. She is not diaphoretic.  Psychiatric: She has a normal mood and affect. Her behavior is normal.         Assessment & Plan:   Physical exam: Screening blood work ordered Immunizations   Up to date  Colonoscopy  Up to date  Mammogram  Up to date  Gyn  Up to date  Eye exams  Up to date  Exercise - regular, runs and weights on weekends Weight  Normal BMI Skin     No concerns - saw derm Substance abuse  none  See Problem List for Assessment and Plan of chronic medical problems.   F/u annually

## 2016-11-06 NOTE — Assessment & Plan Note (Signed)
Controlled - ? Still need medication May try to taper off in the spring to see if she still needs it - we discussed that she can restart it if needed

## 2016-11-07 ENCOUNTER — Encounter: Payer: Self-pay | Admitting: Internal Medicine

## 2016-11-10 IMAGING — MG MM RT RADIOACTIVE SEED LOC MAMMO GUIDE
7 series · 7 of 7 positions shown · non-contrast
Comparison: Previous exam(s).

CLINICAL DATA: Patient presents for radioactive seed localization
of a right breast carcinoma.

EXAM:
MAMMOGRAPHIC GUIDED RADIOACTIVE SEED LOCALIZATION OF THE RIGHT
BREAST

[R CC (1 of 3)]
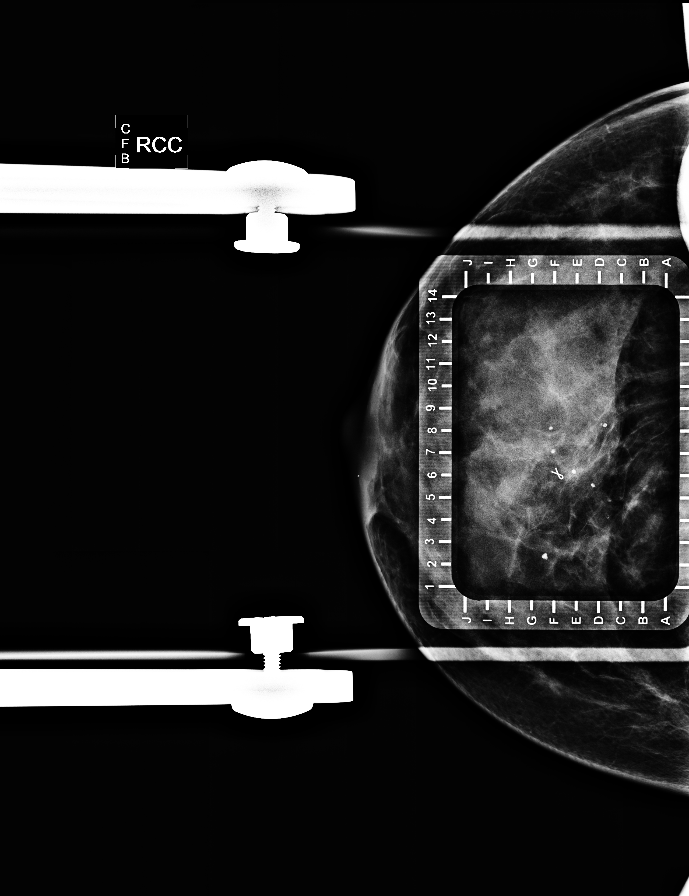

[R CC (2 of 3)]
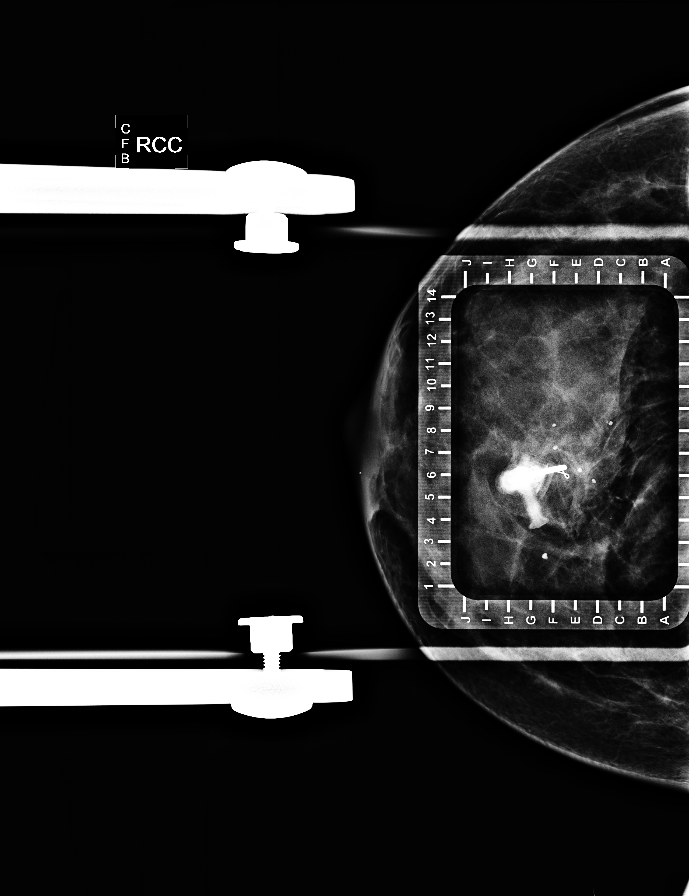

[R ML (1 of 4)]
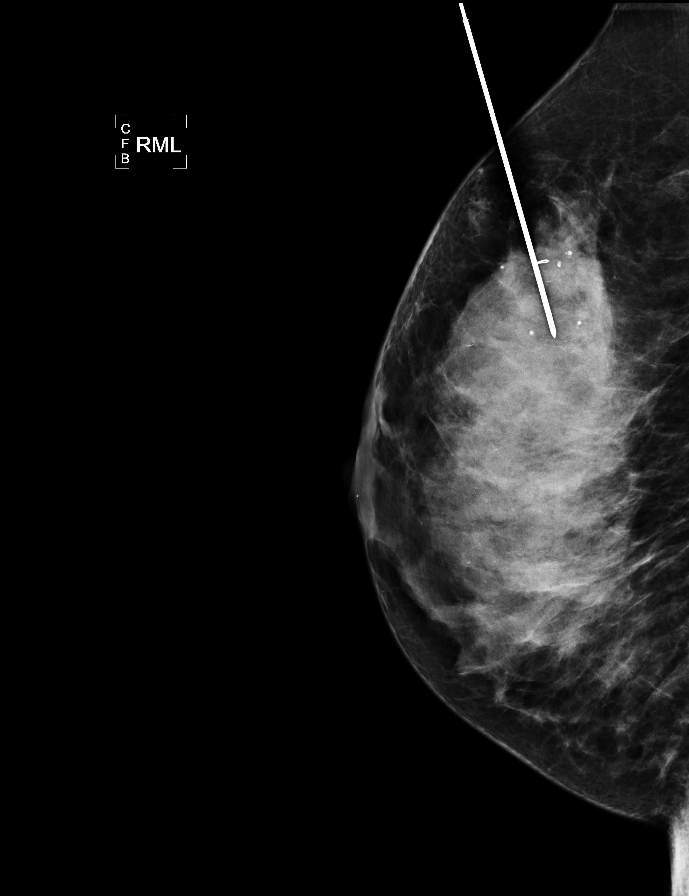

[R ML (2 of 4)]
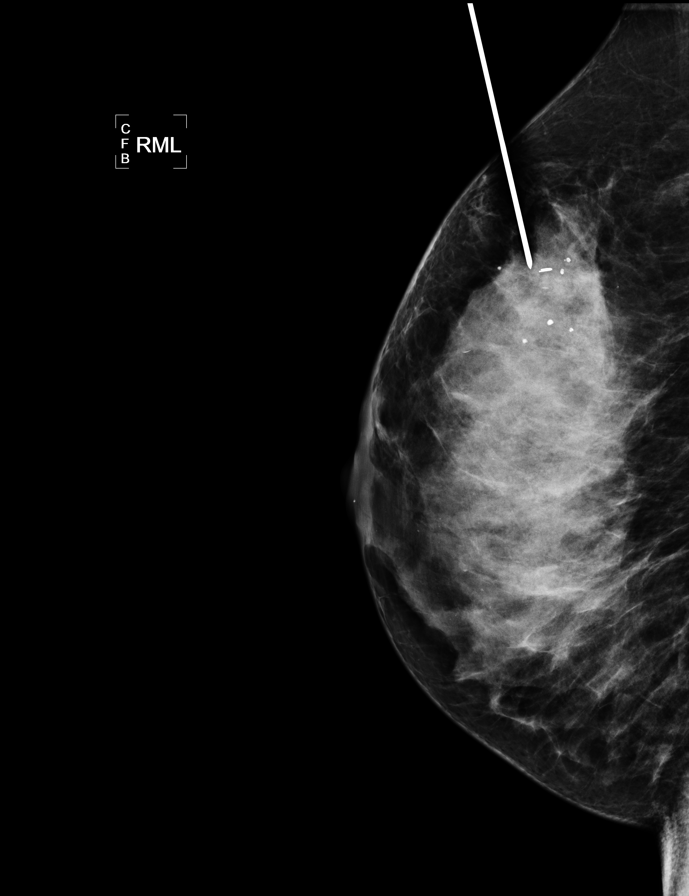

[R CC (3 of 3)]
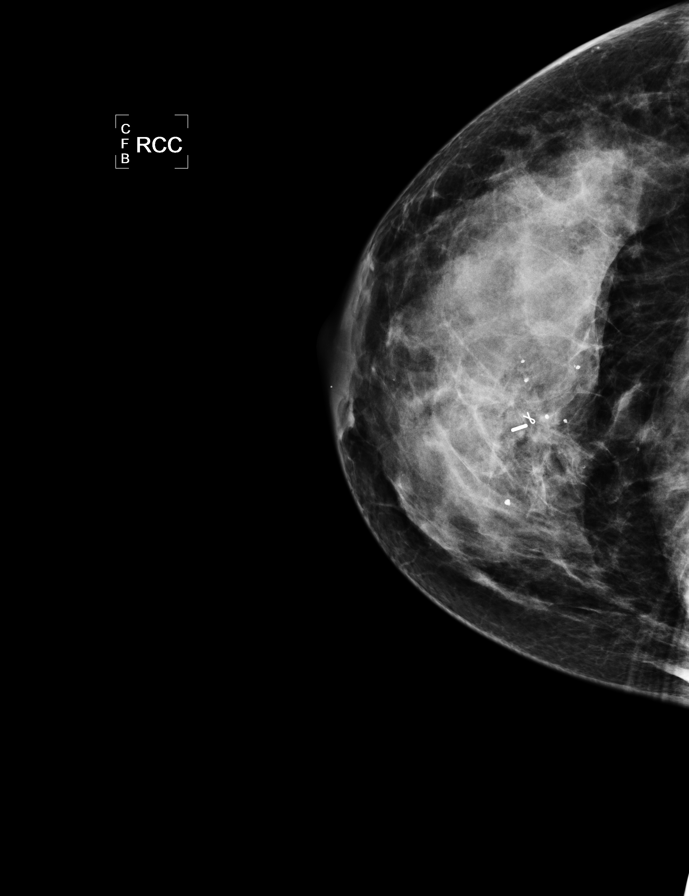

[R ML (3 of 4)]
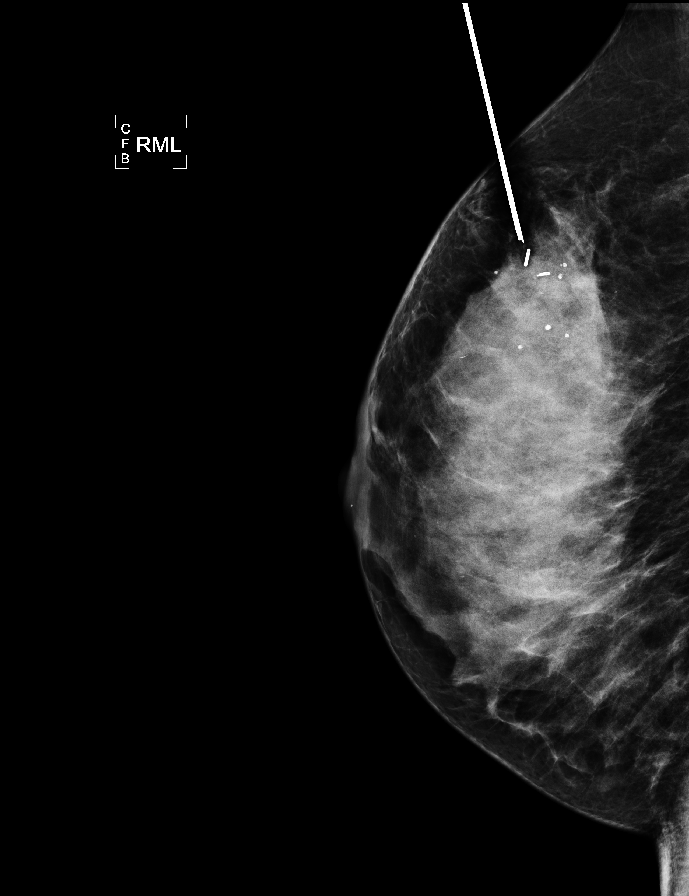

[R ML (4 of 4)]
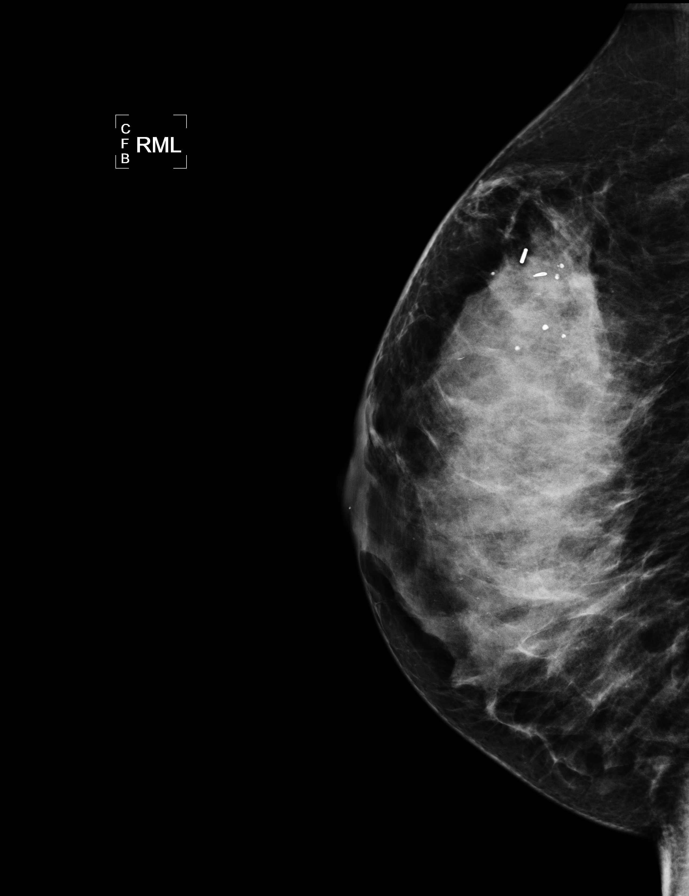

[7 of 7 positions shown; findings below may reference images not displayed]

FINDINGS: Patient presents for radioactive seed localization prior to surgical
excision of a right breast carcinoma. I met with the patient and we
discussed the procedure of seed localization including benefits and
alternatives. We discussed the high likelihood of a successful
procedure. We discussed the risks of the procedure including
infection, bleeding, tissue injury and further surgery. We discussed
the low dose of radioactivity involved in the procedure. Informed,
written consent was given.

The usual time-out protocol was performed immediately prior to the
procedure.

Using mammographic guidance, sterile technique, 2% lidocaine and an
S-HYX radioactive seed, the ribbon shaped biopsy clip was localized
using a cranial to caudal approach. The follow-up mammogram images
confirm the seed in the expected location and were marked for Dr.
Lianet.

Follow-up survey of the patient confirms presence of the radioactive
seed.

Order number of S-HYX seed:  [id number].

Total activity:  0.244 millicuries  Reference Date: 11/03/2015

The patient tolerated the procedure well and was released from the
[REDACTED]. She was given instructions regarding seed removal.
IMPRESSION: Radioactive seed localization right breast. No apparent
complications.

## 2016-11-12 ENCOUNTER — Ambulatory Visit (HOSPITAL_BASED_OUTPATIENT_CLINIC_OR_DEPARTMENT_OTHER): Payer: 59 | Admitting: Hematology and Oncology

## 2016-11-12 ENCOUNTER — Encounter: Payer: Self-pay | Admitting: Hematology and Oncology

## 2016-11-12 DIAGNOSIS — Z170 Estrogen receptor positive status [ER+]: Secondary | ICD-10-CM | POA: Diagnosis not present

## 2016-11-12 DIAGNOSIS — C50211 Malignant neoplasm of upper-inner quadrant of right female breast: Secondary | ICD-10-CM

## 2016-11-12 DIAGNOSIS — Z79810 Long term (current) use of selective estrogen receptor modulators (SERMs): Secondary | ICD-10-CM | POA: Diagnosis not present

## 2016-11-12 NOTE — Assessment & Plan Note (Signed)
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 Occasional hot flashes and sweats  Breast Cancer Surveillance: 1. Breast exam 11/12/2016: Benign 2. Mammogram 09/30/2016 No abnormalities. Postsurgical changes. Breast Density Category C. I recommended that she get 3-D mammograms for surveillance. Discussed the differences between different breast density categories.   Return to clinic in 6 months for follow-up after that we can see her once a year

## 2016-11-12 NOTE — Progress Notes (Signed)
Patient Care Team: Binnie Rail, MD as PCP - General (Internal Medicine) Rigoberto Noel, MD (Pulmonary Disease) Marylynn Pearson, MD (Obstetrics and Gynecology) Juanita Craver, MD (Gastroenterology) Erroll Luna, MD as Consulting Physician (General Surgery) Nicholas Lose, MD as Consulting Physician (Hematology and Oncology) Eppie Gibson, MD as Attending Physician (Radiation Oncology) Sylvan Cheese, NP as Nurse Practitioner (Hematology and Oncology)  DIAGNOSIS:  Encounter Diagnosis  Name Primary?  . 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 Planned duration of therapy 5 years.      03/01/2016 Survivorship    Survivorship visit completed       CHIEF COMPLIANT: Tolerating tamoxifen extremely well  INTERVAL HISTORY: Jeanne Parker is a 55 year old with above-mentioned history of right breast cancer treated with lumpectomy and adjuvant radiation therapy. She is currently on tamoxifen and appears to be tolerating it extremely well. She does have occasional sweats and hot flashes. Denies  any myalgias or arthralgias. She is still quite anxious about recurrence of breast cancer.She was quite emotional today because she thought that hot flashes indicate recurrence of breast cancer.  REVIEW OF SYSTEMS:   Constitutional: Denies fevers, chills or abnormal weight loss Eyes: Denies blurriness of vision Ears, nose, mouth, throat, and face: Denies mucositis or sore throat Respiratory: Denies cough, dyspnea or wheezes Cardiovascular: Denies palpitation, chest discomfort Gastrointestinal:  Denies nausea, heartburn or change in bowel habits Skin: Denies abnormal skin rashes Lymphatics: Denies new lymphadenopathy or easy bruising Neurological:Denies numbness, tingling or new weaknesses Behavioral/Psych: Anxious and worried about breast cancer recurrence. Extremities: No lower extremity edema Breast:  denies any pain or lumps or nodules in either breasts All other systems were reviewed with the patient and are negative.  I have reviewed the past medical history, past surgical history, social history and family history with the patient and they are unchanged from previous note.  ALLERGIES:  has No Known Allergies.  MEDICATIONS:  Current Outpatient Prescriptions  Medication Sig Dispense Refill  . aspirin 81 MG tablet Take 81 mg by mouth daily.    . citalopram (CELEXA) 20 MG tablet Take 1 tablet (20 mg total) by mouth daily. 90 tablet 3  . eszopiclone (LUNESTA) 2 MG TABS tablet Take 1 tablet (2 mg total) by mouth at bedtime as needed for sleep. Take immediately before bedtime 90 tablet 0  . levothyroxine (SYNTHROID, LEVOTHROID) 75 MCG tablet Take 1 tablet (75  mcg total) by mouth daily before breakfast. 90 tablet 3  . Multiple Vitamin (MULTIVITAMIN) capsule Take 1 capsule by mouth daily.    . tamoxifen (NOLVADEX) 20 MG tablet Take 1 tablet (20 mg total) by mouth daily. 90 tablet 3   No current facility-administered medications for this visit.     PHYSICAL EXAMINATION: ECOG  PERFORMANCE STATUS: 1 - Symptomatic but completely ambulatory  Vitals:   11/12/16 0932  BP: (!) 157/84  Pulse: 67  Resp: 18  Temp: 98.1 F (36.7 C)   Filed Weights   11/12/16 0932  Weight: 140 lb 3.2 oz (63.6 kg)    GENERAL:alert, no distress and comfortable SKIN: skin color, texture, turgor are normal, no rashes or significant lesions EYES: normal, Conjunctiva are pink and non-injected, sclera clear OROPHARYNX:no exudate, no erythema and lips, buccal mucosa, and tongue normal  NECK: supple, thyroid normal size, non-tender, without nodularity LYMPH:  no palpable lymphadenopathy in the cervical, axillary or inguinal LUNGS: clear to auscultation and percussion with normal breathing effort HEART: regular rate & rhythm and no murmurs and no lower extremity edema ABDOMEN:abdomen soft, non-tender and normal bowel sounds MUSCULOSKELETAL:no cyanosis of digits and no clubbing  NEURO: alert & oriented x 3 with fluent speech, no focal motor/sensory deficits EXTREMITIES: No lower extremity edema BREAST: No palpable masses or nodules in either right or left breasts. No palpable axillary supraclavicular or infraclavicular adenopathy no breast tenderness or nipple discharge. (exam performed in the presence of a chaperone)  LABORATORY DATA:  I have reviewed the data as listed   Chemistry      Component Value Date/Time   NA 140 11/06/2016 0850   NA 141 10/25/2015 1246   K 4.5 11/06/2016 0850   K 4.0 10/25/2015 1246   CL 104 11/06/2016 0850   CO2 28 11/06/2016 0850   CO2 27 10/25/2015 1246   BUN 15 11/06/2016 0850   BUN 13.0 10/25/2015 1246   CREATININE 0.76 11/06/2016 0850   CREATININE 0.9 10/25/2015 1246      Component Value Date/Time   CALCIUM 9.5 11/06/2016 0850   CALCIUM 9.7 10/25/2015 1246   ALKPHOS 27 (L) 11/06/2016 0850   ALKPHOS 36 (L) 10/25/2015 1246   AST 22 11/06/2016 0850   AST 22 10/25/2015 1246   ALT 21 11/06/2016 0850   ALT 21 10/25/2015 1246   BILITOT 0.4  11/06/2016 0850   BILITOT 0.34 10/25/2015 1246       Lab Results  Component Value Date   WBC 3.1 (L) 11/06/2016   HGB 12.7 11/06/2016   HCT 37.6 11/06/2016   MCV 92.5 11/06/2016   PLT 260.0 11/06/2016   NEUTROABS 1.6 11/06/2016    ASSESSMENT & PLAN:  Breast cancer of upper-inner quadrant of right female breast ([place]) Right lumpectomy 11/16/2015: Invasive ductal carcinoma 2.7 cm with DCIS, grade 1, left, LCIS, 0/4 lymph nodes negative, T2 N0 stage II a, ER 95%, PR 80%, HER-2 negative, Ki-67 3%, Oncotype Dx 11 (7% ROR) Adj XRT completed 01/29/16  Treatment: Anti-estrogen therapy with tamoxifen 20 mg daily started 02/24/2016  Tamoxifen toxicities: 1. Occasional hot flashes and sweats  Breast Cancer Surveillance: 1. Breast exam 11/12/2016: Benign 2. Mammogram 09/30/2016 No abnormalities. Postsurgical changes. Breast Density Category C. I recommended that she get 3-D mammograms for surveillance. Discussed the differences between different breast density categories.   Emotional issues: I encouraged her to participate in the [facility] program. I reassured her that hot flashes not indicated breast cancer recurrence. I asked her to take  tamoxifen in the evening and see if it makes any difference. Her last menstrual cycle was in May 2017. We can consider changing her from tamoxifen to anastrozole or letrozole next year if she was truly menopausal.  Return to clinic in 6 months for follow-up after that we can see her once a year   No orders of the defined types were placed in this encounter.  The patient has a good understanding of the overall plan. she agrees with it. she will call with any problems that may develop before the next visit here.   Rulon Eisenmenger, MD 11/12/16

## 2016-11-28 DIAGNOSIS — Z6823 Body mass index (BMI) 23.0-23.9, adult: Secondary | ICD-10-CM | POA: Diagnosis not present

## 2016-11-28 DIAGNOSIS — Z01419 Encounter for gynecological examination (general) (routine) without abnormal findings: Secondary | ICD-10-CM | POA: Diagnosis not present

## 2016-12-17 ENCOUNTER — Other Ambulatory Visit: Payer: Self-pay | Admitting: Internal Medicine

## 2017-01-09 ENCOUNTER — Other Ambulatory Visit: Payer: Self-pay | Admitting: Hematology and Oncology

## 2017-01-09 DIAGNOSIS — C50211 Malignant neoplasm of upper-inner quadrant of right female breast: Secondary | ICD-10-CM

## 2017-01-10 ENCOUNTER — Other Ambulatory Visit: Payer: Self-pay | Admitting: Emergency Medicine

## 2017-01-10 MED ORDER — CITALOPRAM HYDROBROMIDE 20 MG PO TABS: 20.0000 mg | ORAL_TABLET | Freq: Every day | ORAL | 3 refills | Status: DC

## 2017-01-15 ENCOUNTER — Telehealth: Payer: Self-pay | Admitting: Internal Medicine

## 2017-01-15 ENCOUNTER — Ambulatory Visit (INDEPENDENT_AMBULATORY_CARE_PROVIDER_SITE_OTHER): Payer: 59 | Admitting: Internal Medicine

## 2017-01-15 ENCOUNTER — Encounter: Payer: Self-pay | Admitting: Internal Medicine

## 2017-01-15 VITALS — BP 118/68 | HR 83 | Temp 99.0°F | Resp 16 | Ht 65.0 in | Wt 140.2 lb

## 2017-01-15 DIAGNOSIS — R6889 Other general symptoms and signs: Secondary | ICD-10-CM | POA: Diagnosis not present

## 2017-01-15 DIAGNOSIS — J101 Influenza due to other identified influenza virus with other respiratory manifestations: Secondary | ICD-10-CM | POA: Diagnosis not present

## 2017-01-15 LAB — POCT INFLUENZA A/B
Influenza A, POC: POSITIVE — AB
Influenza B, POC: NEGATIVE

## 2017-01-15 MED ORDER — HYDROCODONE-HOMATROPINE 5-1.5 MG/5ML PO SYRP: 5.0000 mL | ORAL_SOLUTION | Freq: Three times a day (TID) | ORAL | 0 refills | Status: DC | PRN

## 2017-01-15 MED ORDER — OSELTAMIVIR PHOSPHATE 75 MG PO CAPS: 75.0000 mg | ORAL_CAPSULE | Freq: Two times a day (BID) | ORAL | 0 refills | Status: DC

## 2017-01-15 NOTE — Telephone Encounter (Signed)
Patient Name: DEANNE VOLPE DOB: 07/26/1961 Initial Comment Caller states she started sneezing on Monday, fever of 100.3, fever started yesterday. Chills, Fatigue. Nurse Assessment Nurse: Zorita Pang, RN, Deborah Date/Time (Eastern Time): 01/15/2017 2:21:50 PM Confirm and document reason for call. If symptomatic, describe symptoms. ---Started sneezing on Monday and scratchy throat. She states that she started running a fever of 100.3 (oral). She states that she has a cough but you are taking Mucinex. Now she has a stuffy nose. Highest temperature is 101 and she is taking Advil and Tylenol and she has a headache too. Does the patient have any new or worsening symptoms? ---Yes Will a triage be completed? ---Yes Related visit to physician within the last 2 weeks? ---No Does the PT have any chronic conditions? (i.e. diabetes, asthma, etc.) ---Yes List chronic conditions. ---Hypothyroid and she takes Tamoxil for 2 years. Is this a behavioral health or substance abuse call? ---No Guidelines Guideline Title Affirmed Question Affirmed Notes Cough - Acute Non-Productive [1] Patient also has allergy symptoms (e.g., itchy eyes, clear nasal discharge, postnasal drip) AND [2] they are acting up Final Disposition User See PCP When Office is Open (within 3 days) Wom[place], RN, Neoma Laming Comments No appt avialable with PCP. Appt scheduled for 4pm today with Dr. Ronnald Ramp. Disagree/Comply: Comply

## 2017-01-15 NOTE — Progress Notes (Signed)
Subjective:  Patient ID: Jeanne Parker, female    DOB: December 22, 1960  Age: 56 y.o. MRN: [id number]  CC: Cough   HPI Jeanne Parker presents for A 2 day history of sore throat, sneezing, low-grade fever, chills, nonproductive cough, mild headache, and runny nose.  Outpatient Medications Prior to Visit  Medication Sig Dispense Refill  . aspirin 81 MG tablet Take 81 mg by mouth daily.    . citalopram (CELEXA) 20 MG tablet Take 1 tablet (20 mg total) by mouth daily. 90 tablet 3  . eszopiclone (LUNESTA) 2 MG TABS tablet Take 1 tablet (2 mg total) by mouth at bedtime as needed for sleep. Take immediately before bedtime 90 tablet 0  . levothyroxine (SYNTHROID, LEVOTHROID) 75 MCG tablet TAKE 1 TABLET BY MOUTH  DAILY BEFORE BREAKFAST 90 tablet 3  . Multiple Vitamin (MULTIVITAMIN) capsule Take 1 capsule by mouth daily.    . tamoxifen (NOLVADEX) 20 MG tablet TAKE 1 TABLET BY MOUTH  DAILY 90 tablet 3   No facility-administered medications prior to visit.     ROS Review of Systems  Constitutional: Positive for chills and fever. Negative for fatigue.  HENT: Positive for sore throat. Negative for sinus pressure and trouble swallowing.   Eyes: Negative.   Respiratory: Positive for cough. Negative for chest tightness, shortness of breath and wheezing.   Cardiovascular: Negative for chest pain and palpitations.  Gastrointestinal: Negative for abdominal pain, diarrhea, nausea and vomiting.  Endocrine: Negative.   Genitourinary: Negative.   Musculoskeletal: Negative.  Negative for back pain, joint swelling and myalgias.  Skin: Negative.  Negative for color change and rash.  Allergic/Immunologic: Negative.   Neurological: Negative.   Hematological: Negative.  Negative for adenopathy. Does not bruise/bleed easily.  Psychiatric/Behavioral: Negative.     Objective:  BP 118/68 (BP Location: Left Arm, Patient Position: Sitting, Cuff Size: Normal)   Pulse 83   Temp 99 F (37.2 C) (Oral)    Resp 16   Ht 5\' 5"  (1.651 m)   Wt 140 lb 4 oz (63.6 kg)   SpO2 96%   BMI 23.34 kg/m   BP Readings from Last 3 Encounters:  01/15/17 118/68  11/12/16 (!) 157/84  11/06/16 124/80    Wt Readings from Last 3 Encounters:  01/15/17 140 lb 4 oz (63.6 kg)  11/12/16 140 lb 3.2 oz (63.6 kg)  11/06/16 138 lb (62.6 kg)    Physical Exam  Constitutional: She is oriented to person, place, and time. No distress.  HENT:  Mouth/Throat: Oropharynx is clear and moist. No oropharyngeal exudate.  Eyes: Conjunctivae are normal. Right eye exhibits no discharge. Left eye exhibits no discharge. No scleral icterus.  Neck: Normal range of motion. Neck supple. No JVD present. No tracheal deviation present. No thyromegaly present.  Cardiovascular: Normal rate, regular rhythm, normal heart sounds and intact distal pulses.  Exam reveals no gallop and no friction rub.   No murmur heard. Pulmonary/Chest: Effort normal and breath sounds normal. No stridor. No respiratory distress. She has no wheezes. She has no rales. She exhibits no tenderness.  Abdominal: Soft. Bowel sounds are normal. She exhibits no distension and no mass. There is no tenderness. There is no rebound and no guarding.  Musculoskeletal: Normal range of motion. She exhibits no edema, tenderness or deformity.  Lymphadenopathy:    She has no cervical adenopathy.  Neurological: She is oriented to person, place, and time.  Skin: Skin is warm and dry. No rash noted. She is not diaphoretic. No  erythema. No pallor.  Vitals reviewed.   Lab Results  Component Value Date   WBC 3.1 (L) 11/06/2016   HGB 12.7 11/06/2016   HCT 37.6 11/06/2016   PLT 260.0 11/06/2016   GLUCOSE 102 (H) 11/06/2016   CHOL 236 (H) 11/06/2016   TRIG 69.0 11/06/2016   HDL 70.30 11/06/2016   LDLDIRECT 158.7 01/04/2013   LDLCALC 152 (H) 11/06/2016   ALT 21 11/06/2016   AST 22 11/06/2016   NA 140 11/06/2016   K 4.5 11/06/2016   CL 104 11/06/2016   CREATININE 0.76  11/06/2016   BUN 15 11/06/2016   CO2 28 11/06/2016   TSH 1.69 11/06/2016    Mm Diag Breast Tomo Bilateral  Result Date: 09/30/2016 CLINICAL DATA:  Annual examination. History of right breast cancer, diagnosed in November 2016. She has undergone lumpectomy radiation therapy. EXAM: 2D DIGITAL DIAGNOSTIC BILATERAL MAMMOGRAM WITH CAD AND ADJUNCT TOMO COMPARISON:  Previous exam(s). ACR Breast Density Category c: The breast tissue is heterogeneously dense, which may obscure small masses. FINDINGS: There are lumpectomy changes in the deep upper central right breast. No mass, nonsurgical distortion, or suspicious microcalcification is identified in either breast to suggest malignancy. Mammographic images were processed with CAD. IMPRESSION: Lumpectomy changes in the right breast. No evidence of malignancy bilaterally. RECOMMENDATION: Diagnostic mammogram is suggested in 1 year. (Code:[id number]-B-01Y) I have discussed the findings and recommendations with the patient. Results were also provided in writing at the conclusion of the visit. If applicable, a reminder letter will be sent to the patient regarding the next appointment. BI-RADS CATEGORY  2: Benign. Electronically Signed   By: Curlene Dolphin M.D.   On: 09/30/2016 09:55    Assessment & Plan:   Jeanne Parker was seen today for cough.  Diagnoses and all orders for this visit:  Flu-like symptoms -     POCT Influenza A/B -     HYDROcodone-homatropine (HYCODAN) 5-1.5 MG/5ML syrup; Take 5 mLs by mouth every 8 (eight) hours as needed for cough.  Influenza A -     oseltamivir (TAMIFLU) 75 MG capsule; Take 1 capsule (75 mg total) by mouth 2 (two) times daily. -     HYDROcodone-homatropine (HYCODAN) 5-1.5 MG/5ML syrup; Take 5 mLs by mouth every 8 (eight) hours as needed for cough.   I am having Jeanne Parker start on oseltamivir and HYDROcodone-homatropine. I am also having her maintain her aspirin, eszopiclone, multivitamin, levothyroxine, tamoxifen, and  citalopram.  Meds ordered this encounter  Medications  . oseltamivir (TAMIFLU) 75 MG capsule    Sig: Take 1 capsule (75 mg total) by mouth 2 (two) times daily.    Dispense:  10 capsule    Refill:  0  . HYDROcodone-homatropine (HYCODAN) 5-1.5 MG/5ML syrup    Sig: Take 5 mLs by mouth every 8 (eight) hours as needed for cough.    Dispense:  120 mL    Refill:  0     Follow-up: No Follow-up on file.  Scarlette Calico, MD

## 2017-01-15 NOTE — Patient Instructions (Signed)

## 2017-01-15 NOTE — Progress Notes (Signed)
Pre visit review using our clinic review tool, if applicable. No additional management support is needed unless otherwise documented below in the visit note. 

## 2017-02-19 DIAGNOSIS — B308 Other viral conjunctivitis: Secondary | ICD-10-CM | POA: Diagnosis not present

## 2017-02-19 DIAGNOSIS — H2513 Age-related nuclear cataract, bilateral: Secondary | ICD-10-CM | POA: Diagnosis not present

## 2017-02-19 DIAGNOSIS — H10413 Chronic giant papillary conjunctivitis, bilateral: Secondary | ICD-10-CM | POA: Diagnosis not present

## 2017-03-31 DIAGNOSIS — H10413 Chronic giant papillary conjunctivitis, bilateral: Secondary | ICD-10-CM | POA: Diagnosis not present

## 2017-03-31 DIAGNOSIS — H2513 Age-related nuclear cataract, bilateral: Secondary | ICD-10-CM | POA: Diagnosis not present

## 2017-03-31 DIAGNOSIS — H04123 Dry eye syndrome of bilateral lacrimal glands: Secondary | ICD-10-CM | POA: Diagnosis not present

## 2017-05-06 DIAGNOSIS — H2513 Age-related nuclear cataract, bilateral: Secondary | ICD-10-CM | POA: Diagnosis not present

## 2017-05-06 DIAGNOSIS — H10413 Chronic giant papillary conjunctivitis, bilateral: Secondary | ICD-10-CM | POA: Diagnosis not present

## 2017-05-06 DIAGNOSIS — H04123 Dry eye syndrome of bilateral lacrimal glands: Secondary | ICD-10-CM | POA: Diagnosis not present

## 2017-05-13 ENCOUNTER — Ambulatory Visit: Payer: Self-pay | Admitting: Hematology and Oncology

## 2017-05-19 ENCOUNTER — Ambulatory Visit (HOSPITAL_BASED_OUTPATIENT_CLINIC_OR_DEPARTMENT_OTHER): Payer: 59 | Admitting: Hematology and Oncology

## 2017-05-19 ENCOUNTER — Encounter: Payer: Self-pay | Admitting: Hematology and Oncology

## 2017-05-19 DIAGNOSIS — Z170 Estrogen receptor positive status [ER+]: Secondary | ICD-10-CM | POA: Diagnosis not present

## 2017-05-19 DIAGNOSIS — C50211 Malignant neoplasm of upper-inner quadrant of right female breast: Secondary | ICD-10-CM | POA: Diagnosis not present

## 2017-05-19 DIAGNOSIS — N951 Menopausal and female climacteric states: Secondary | ICD-10-CM | POA: Diagnosis not present

## 2017-05-19 DIAGNOSIS — Z79810 Long term (current) use of selective estrogen receptor modulators (SERMs): Secondary | ICD-10-CM

## 2017-05-19 MED ORDER — ANASTROZOLE 1 MG PO TABS: 1.0000 mg | ORAL_TABLET | Freq: Every day | ORAL | 3 refills | Status: DC

## 2017-05-19 NOTE — Assessment & Plan Note (Signed)
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 Occasional hot flashes and sweats  Breast Cancer Surveillance: 1. Breast exam 11/12/2016: Benign 2. Mammogram 09/30/2016 No abnormalities. Postsurgical changes. Breast Density Category C.    Emotional issues: I encouraged her to participate in the [facility] program. I asked her to take tamoxifen in the evening and see if it makes any difference. Her last menstrual cycle was in May 2017. We can consider changing her from tamoxifen to anastrozole or letrozole next year if she was truly menopausal.  Return to clinic in one year 

## 2017-05-19 NOTE — Progress Notes (Signed)
Patient Care Team: Binnie Rail, MD as PCP - General (Internal Medicine) Rigoberto Noel, MD (Pulmonary Disease) Marylynn Pearson, MD (Obstetrics and Gynecology) Juanita Craver, MD (Gastroenterology) Erroll Luna, MD as Consulting Physician (General Surgery) Nicholas Lose, MD as Consulting Physician (Hematology and Oncology) Eppie Gibson, MD as Attending Physician (Radiation Oncology) Sylvan Cheese, NP as Nurse Practitioner (Hematology and Oncology)  DIAGNOSIS:  Encounter Diagnosis  Name Primary?  . 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 Planned duration of therapy 5 years.      03/01/2016 Survivorship    Survivorship visit completed       CHIEF COMPLIANT: Follow-up on tamoxifen therapy  INTERVAL HISTORY: Jeanne Parker is a 56 year old with above-mentioned is a right breast cancer treated with lumpectomy and radiation. She is currently on tamoxifen therapy has been tolerating it fairly well. She continues to have occasional hot flashes. Denies any myalgias. Denies any lumps  or nodules in the breast.  REVIEW OF SYSTEMS:   Constitutional: Denies fevers, chills or abnormal weight loss Eyes: Denies blurriness of vision Ears, nose, mouth, throat, and face: Denies mucositis or sore throat Respiratory: Denies cough, dyspnea or wheezes Cardiovascular: Denies palpitation, chest discomfort Gastrointestinal:  Denies nausea, heartburn or change in bowel habits Skin: Denies abnormal skin rashes Lymphatics: Denies new lymphadenopathy or easy bruising Neurological:Denies numbness, tingling or new weaknesses Behavioral/Psych: Very anxious Extremities: No lower extremity edema Breast:  denies any pain or lumps or nodules in either breasts All other systems were reviewed with the patient and are negative.  I have reviewed the past medical history, past surgical history, social history and family history with the patient and they are unchanged from previous note.  ALLERGIES:  has No Known Allergies.  MEDICATIONS:  Current Outpatient Prescriptions  Medication Sig Dispense Refill  . anastrozole (ARIMIDEX) 1 MG tablet Take 1 tablet (1 mg total) by mouth daily. 90 tablet 3  . aspirin 81 MG tablet Take 81 mg by mouth daily.    . citalopram (CELEXA) 20 MG tablet Take 1 tablet (20 mg total) by mouth daily. 90 tablet 3  . eszopiclone (LUNESTA) 2 MG TABS tablet Take 1 tablet (2 mg total) by mouth at bedtime as needed for sleep. Take immediately before bedtime 90 tablet 0  . HYDROcodone-homatropine (HYCODAN) 5-1.5 MG/5ML syrup Take 5 mLs by mouth every 8 (eight) hours as needed for cough. [place]  mL 0  . levothyroxine (SYNTHROID, LEVOTHROID) 75 MCG tablet TAKE 1 TABLET BY MOUTH  DAILY BEFORE BREAKFAST 90 tablet 3  . Multiple Vitamin (MULTIVITAMIN) capsule Take 1 capsule by mouth daily.    Marland Kitchen oseltamivir (TAMIFLU) 75 MG capsule Take 1 capsule (75 mg total) by mouth 2 (two) times daily. 10 capsule 0   No current facility-administered medications for this visit.     PHYSICAL  EXAMINATION: ECOG PERFORMANCE STATUS: 1 - Symptomatic but completely ambulatory  Vitals:   05/19/17 1516  BP: 126/82  Pulse: 67  Resp: 18  Temp: 98.4 F (36.9 C)   Filed Weights   05/19/17 1516  Weight: 139 lb 8 oz (63.3 kg)    GENERAL:alert, no distress and comfortable SKIN: skin color, texture, turgor are normal, no rashes or significant lesions EYES: normal, Conjunctiva are pink and non-injected, sclera clear OROPHARYNX:no exudate, no erythema and lips, buccal mucosa, and tongue normal  NECK: supple, thyroid normal size, non-tender, without nodularity LYMPH:  no palpable lymphadenopathy in the cervical, axillary or inguinal LUNGS: clear to auscultation and percussion with normal breathing effort HEART: regular rate & rhythm and no murmurs and no lower extremity edema ABDOMEN:abdomen soft, non-tender and normal bowel sounds MUSCULOSKELETAL:no cyanosis of digits and no clubbing  NEURO: alert & oriented x 3 with fluent speech, no focal motor/sensory deficits EXTREMITIES: No lower extremity edema BREAST: No palpable masses or nodules in either right or left breasts. No palpable axillary supraclavicular or infraclavicular adenopathy no breast tenderness or nipple discharge. (exam performed in the presence of a chaperone)  LABORATORY DATA:  I have reviewed the data as listed   Chemistry      Component Value Date/Time   NA 140 11/06/2016 0850   NA 141 10/25/2015 1246   K 4.5 11/06/2016 0850   K 4.0 10/25/2015 1246   CL 104 11/06/2016 0850   CO2 28 11/06/2016 0850   CO2 27 10/25/2015 1246   BUN 15 11/06/2016 0850   BUN 13.0 10/25/2015 1246   CREATININE 0.76 11/06/2016 0850   CREATININE 0.9 10/25/2015 1246      Component Value Date/Time   CALCIUM 9.5 11/06/2016 0850   CALCIUM 9.7 10/25/2015 1246   ALKPHOS 27 (L) 11/06/2016 0850   ALKPHOS 36 (L) 10/25/2015 1246   AST 22 11/06/2016 0850   AST 22 10/25/2015 1246   ALT 21 11/06/2016 0850   ALT 21 10/25/2015 1246    BILITOT 0.4 11/06/2016 0850   BILITOT 0.34 10/25/2015 1246       Lab Results  Component Value Date   WBC 3.1 (L) 11/06/2016   HGB 12.7 11/06/2016   HCT 37.6 11/06/2016   MCV 92.5 11/06/2016   PLT 260.0 11/06/2016   NEUTROABS 1.6 11/06/2016    ASSESSMENT & PLAN:  Breast cancer of upper-inner quadrant of right female breast ([place]) Right lumpectomy 11/16/2015: 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 Occasional hot flashes and sweats Patient is menopausal and would like to switch to anastrozole. I gave her a new prescription for anastrozole starting today.  Breast Cancer Surveillance: 1. Breast exam 11/12/2016: Benign 2. Mammogram 09/30/2016 No abnormalities. Postsurgical changes. Breast Density Category C.    Emotional issues:  She has done much better when she switched her to evening. Patient is in the process of cutting down and discontinuing Celexa.  Return to clinic in 3 months for toxicity check on anastrozole. After that we can see her once a year if she tolerates it well.   I spent 15 minutes talking to the patient of which more than half was spent in counseling and coordination of care.  No orders of the defined types were placed in this encounter.  The patient has a good understanding of the overall plan. she agrees with it. she will call with any problems that may develop before the next visit here.   Rulon Eisenmenger, MD 05/19/17

## 2017-05-22 ENCOUNTER — Other Ambulatory Visit: Payer: Self-pay

## 2017-05-26 ENCOUNTER — Telehealth: Payer: Self-pay | Admitting: *Deleted

## 2017-05-26 NOTE — Telephone Encounter (Signed)
Returned call to [facility]  Reference:  [id number]. Ask what current therapy is for this patient.  Informed patient currently to use Anastrozole due to toxicities with Tamoxifen use.  No further questions.

## 2017-07-16 ENCOUNTER — Other Ambulatory Visit: Payer: Self-pay

## 2017-07-16 DIAGNOSIS — Z170 Estrogen receptor positive status [ER+]: Secondary | ICD-10-CM

## 2017-07-16 DIAGNOSIS — C50211 Malignant neoplasm of upper-inner quadrant of right female breast: Secondary | ICD-10-CM

## 2017-07-29 ENCOUNTER — Ambulatory Visit
Admission: RE | Admit: 2017-07-29 | Discharge: 2017-07-29 | Disposition: A | Discharge status: Home / Self Care | Payer: 59 | Source: Ambulatory Visit | Attending: Hematology and Oncology | Admitting: Hematology and Oncology

## 2017-07-29 ENCOUNTER — Telehealth: Payer: Self-pay | Admitting: Hematology and Oncology

## 2017-07-29 ENCOUNTER — Other Ambulatory Visit: Payer: Self-pay

## 2017-07-29 DIAGNOSIS — Z170 Estrogen receptor positive status [ER+]: Secondary | ICD-10-CM

## 2017-07-29 DIAGNOSIS — M85851 Other specified disorders of bone density and structure, right thigh: Secondary | ICD-10-CM | POA: Diagnosis not present

## 2017-07-29 DIAGNOSIS — C50211 Malignant neoplasm of upper-inner quadrant of right female breast: Secondary | ICD-10-CM

## 2017-07-29 DIAGNOSIS — Z780 Asymptomatic menopausal state: Secondary | ICD-10-CM | POA: Diagnosis not present

## 2017-07-29 NOTE — Telephone Encounter (Signed)
I called and left a message with the bone density showed a T score of -1.7 which is osteopenia. I will discuss the results when I meet with her in my clinic.

## 2017-08-05 ENCOUNTER — Other Ambulatory Visit: Payer: Self-pay

## 2017-08-05 ENCOUNTER — Telehealth: Payer: Self-pay | Admitting: *Deleted

## 2017-08-05 ENCOUNTER — Telehealth: Payer: Self-pay

## 2017-08-05 NOTE — Telephone Encounter (Signed)
Call from pt reporting cold sweats and "more dramatic hot flashes" since beginning Arimidex 6 weeks ago. Pt stated she doesn't "feel well" since switching from Tamoxifen. She has increased anxiety and "terrible quality of life."  Pt also notes she discontinued Citalopram 2 months ago. She is unsure which is causing her current symptoms. She is asking if she can switch from Arimidex or should she resume Citalopram? Informed her I will make MD aware, office will call back.

## 2017-08-05 NOTE — Telephone Encounter (Signed)
Returned pt's call regarding hot flashes and increased anxiety.  Dr Lindi Adie suggests pt go back on her citalopram.  Pt agreeable with this plan.  Pt to restart 20 mg citalopram daily and see VG on 9/25

## 2017-08-19 ENCOUNTER — Ambulatory Visit (HOSPITAL_BASED_OUTPATIENT_CLINIC_OR_DEPARTMENT_OTHER): Payer: 59 | Admitting: Hematology and Oncology

## 2017-08-19 ENCOUNTER — Telehealth: Payer: Self-pay

## 2017-08-19 DIAGNOSIS — C50211 Malignant neoplasm of upper-inner quadrant of right female breast: Secondary | ICD-10-CM

## 2017-08-19 DIAGNOSIS — Z170 Estrogen receptor positive status [ER+]: Secondary | ICD-10-CM | POA: Diagnosis not present

## 2017-08-19 DIAGNOSIS — Z79810 Long term (current) use of selective estrogen receptor modulators (SERMs): Secondary | ICD-10-CM

## 2017-08-19 NOTE — Assessment & Plan Note (Signed)
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 Occasional hot flashes and sweats Patient is menopausal and would like to switch to anastrozole. I gave her a new prescription for anastrozole starting today.  Breast Cancer Surveillance: 1. Breast exam 08/19/17: Benign 2. Mammogram 11/06/2017No abnormalities. Postsurgical changes. Breast Density Category C.  3. Bone density 07/29/17: T score -1.7  Emotional issues: Jeanne Parker has done much better when Jeanne Parker switched her to evening. Patient is in the process of cutting down and discontinuing Celexa.  Return to clinic in one year.

## 2017-08-19 NOTE — Telephone Encounter (Signed)
Gave avs and calender with upcoming appointment per 9/25 los

## 2017-08-19 NOTE — Progress Notes (Signed)
Patient Care Team: Binnie Rail, MD as PCP - General (Internal Medicine) Rigoberto Noel, MD (Pulmonary Disease) Marylynn Pearson, MD (Obstetrics and Gynecology) Juanita Craver, MD (Gastroenterology) Erroll Luna, MD as Consulting Physician (General Surgery) Nicholas Lose, MD as Consulting Physician (Hematology and Oncology) Eppie Gibson, MD as Attending Physician (Radiation Oncology) Sylvan Cheese, NP as Nurse Practitioner (Hematology and Oncology)  DIAGNOSIS:  Encounter Diagnosis  Name Primary?  . Malignant neoplasm of upper-inner quadrant of right breast in female, estrogen receptor positive ([place])     SUMMARY OF ONCOLOGIC HISTORY:   Breast cancer of upper-inner quadrant of right female breast ([place])   10/10/2015 Mammogram    Screening mammogram revealed right breast distortion, extremely dense breasts, at 1:00 position 1 x 1 x 1.4 cm      10/11/2015 Initial Biopsy    Right breast biopsy 1:00: Invasive ductal carcinoma with DCIS and LCIS ER 95%, PR 80%, HER-2 negative ratio 1.15, Ki-67 3%      10/11/2015 Clinical Stage    Stage IA: T1c N0      11/16/2015 Surgery    Right lumpectomy: 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 Planned duration of therapy 5 years.      03/01/2016 Survivorship    Survivorship visit completed       CHIEF COMPLIANT: Follow-up on tamoxifen therapy  INTERVAL HISTORY: Jeanne WEISBERG is a 56 year old with above-mentioned history of right breast cancer treated with lumpectomy and radiation is currently on tamoxifen since April 2017. She is tolerating tamoxifen extremely well. She reports no major problems with hot flashes or myalgias.  Denies any lumps or nodules in breast.  REVIEW OF SYSTEMS:   Constitutional: Denies fevers, chills or abnormal weight loss Eyes: Denies blurriness of vision Ears, nose, mouth, throat, and face: Denies mucositis or sore throat Respiratory: Denies cough, dyspnea or wheezes Cardiovascular: Denies palpitation, chest discomfort Gastrointestinal:  Denies nausea, heartburn or change in bowel habits Skin: Denies abnormal skin rashes Lymphatics: Denies new lymphadenopathy or easy bruising Neurological:Denies numbness, tingling or new weaknesses Behavioral/Psych: Mood is stable, no new changes  Extremities: No lower extremity edema Breast:  denies any pain or lumps or nodules in either breasts All other systems were reviewed with the patient and are negative.  I have reviewed the past medical history, past surgical history, social history and family history with the patient and they are unchanged from previous note.  ALLERGIES:  has No Known Allergies.  MEDICATIONS:  Current Outpatient Prescriptions  Medication Sig Dispense Refill  . anastrozole (ARIMIDEX) 1 MG tablet Take 1 tablet (1 mg total) by mouth daily. 90 tablet 3  . aspirin 81 MG tablet Take 81 mg by mouth daily.    . citalopram (CELEXA) 20 MG tablet Take 1 tablet (20 mg total) by mouth daily. 90 tablet 3  . citalopram (CELEXA) 20 MG tablet citalopram 20 mg tablet    . eszopiclone (LUNESTA) 2 MG TABS tablet Take 1 tablet (2 mg total) by mouth at bedtime as needed for sleep. Take immediately before bedtime 90 tablet 0  . HYDROcodone-homatropine (  HYCODAN) 5-1.5 MG/5ML syrup Take 5 mLs by mouth every 8 (eight) hours as needed for cough. 120 mL 0  . levothyroxine (SYNTHROID, LEVOTHROID) 75 MCG tablet TAKE 1 TABLET BY MOUTH  DAILY BEFORE BREAKFAST 90 tablet 3  . Multiple Vitamin (MULTIVITAMIN) capsule Take 1 capsule by mouth daily.    Marland Kitchen oseltamivir (TAMIFLU) 75 MG capsule Take 1 capsule (75 mg total) by mouth 2 (two) times daily. 10 capsule  0   No current facility-administered medications for this visit.     PHYSICAL EXAMINATION: ECOG PERFORMANCE STATUS: 1 - Symptomatic but completely ambulatory  Vitals:   08/19/17 1406  BP: 120/81  Pulse: 75  Resp: 18  Temp: 98.2 F (36.8 C)  SpO2: 99%   Filed Weights   08/19/17 1406  Weight: 139 lb 6.4 oz (63.2 kg)    GENERAL:alert, no distress and comfortable SKIN: skin color, texture, turgor are normal, no rashes or significant lesions EYES: normal, Conjunctiva are pink and non-injected, sclera clear OROPHARYNX:no exudate, no erythema and lips, buccal mucosa, and tongue normal  NECK: supple, thyroid normal size, non-tender, without nodularity LYMPH:  no palpable lymphadenopathy in the cervical, axillary or inguinal LUNGS: clear to auscultation and percussion with normal breathing effort HEART: regular rate & rhythm and no murmurs and no lower extremity edema ABDOMEN:abdomen soft, non-tender and normal bowel sounds MUSCULOSKELETAL:no cyanosis of digits and no clubbing  NEURO: alert & oriented x 3 with fluent speech, no focal motor/sensory deficits EXTREMITIES: No lower extremity edema BREAST:  No palpable masses or nodules in either right or left breasts. No palpable axillary supraclavicular or infraclavicular adenopathy no breast tenderness or nipple discharge. (exam performed in the presence of a chaperone)  LABORATORY DATA:  I have reviewed the data as listed   Chemistry      Component Value Date/Time   NA 140 11/06/2016 0850   NA 141 10/25/2015 1246   K 4.5 11/06/2016 0850   K 4.0 10/25/2015 1246   CL 104 11/06/2016 0850   CO2 28 11/06/2016 0850   CO2 27 10/25/2015 1246   BUN 15 11/06/2016 0850   BUN 13.0 10/25/2015 1246   CREATININE 0.76 11/06/2016 0850   CREATININE 0.9 10/25/2015 1246      Component Value Date/Time   CALCIUM 9.5 11/06/2016 0850   CALCIUM 9.7 10/25/2015 1246   ALKPHOS 27 (L) 11/06/2016 0850   ALKPHOS 36 (L) 10/25/2015 1246   AST 22  11/06/2016 0850   AST 22 10/25/2015 1246   ALT 21 11/06/2016 0850   ALT 21 10/25/2015 1246   BILITOT 0.4 11/06/2016 0850   BILITOT 0.34 10/25/2015 1246       Lab Results  Component Value Date   WBC 3.1 (L) 11/06/2016   HGB 12.7 11/06/2016   HCT 37.6 11/06/2016   MCV 92.5 11/06/2016   PLT 260.0 11/06/2016   NEUTROABS 1.6 11/06/2016    ASSESSMENT & PLAN:  Breast cancer of upper-inner quadrant of right female breast ([place]) Right lumpectomy 11/16/2015: 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 Occasional hot flashes and sweats Patient is menopausal and would like to switch to anastrozole. I gave her a new prescription for anastrozole starting today.  Breast Cancer  Surveillance: 1. Breast exam 08/19/17: Benign 2. Mammogram 11/06/2017No abnormalities. Postsurgical changes. Breast Density Category C.  3. Bone density 07/29/17: T score -1.7  Emotional issues: She has done much better when she switched her to evening. Patient is in the process of cutting down and discontinuing Celexa.  Return to clinic in one year.   I spent 25 minutes talking to the patient of which more than half was spent in counseling and coordination of care.  No orders of the defined types were placed in this encounter.  The patient has a good understanding of the overall plan. she agrees with it. she will call with any problems that may develop before the next visit here.   Rulon Eisenmenger, MD 08/19/17

## 2017-09-15 ENCOUNTER — Other Ambulatory Visit: Payer: Self-pay | Admitting: Hematology and Oncology

## 2017-09-15 DIAGNOSIS — Z853 Personal history of malignant neoplasm of breast: Secondary | ICD-10-CM

## 2017-09-28 DIAGNOSIS — Z23 Encounter for immunization: Secondary | ICD-10-CM | POA: Diagnosis not present

## 2017-10-03 ENCOUNTER — Ambulatory Visit
Admission: RE | Admit: 2017-10-03 | Discharge: 2017-10-03 | Disposition: A | Discharge status: Home / Self Care | Payer: 59 | Source: Ambulatory Visit | Attending: Hematology and Oncology | Admitting: Hematology and Oncology

## 2017-10-03 DIAGNOSIS — Z853 Personal history of malignant neoplasm of breast: Secondary | ICD-10-CM

## 2017-10-03 DIAGNOSIS — R922 Inconclusive mammogram: Secondary | ICD-10-CM | POA: Diagnosis not present

## 2017-11-04 DIAGNOSIS — R739 Hyperglycemia, unspecified: Secondary | ICD-10-CM | POA: Insufficient documentation

## 2017-11-04 NOTE — Progress Notes (Signed)
Subjective:    Patient ID: Jeanne Parker, female    DOB: 1960-12-02, 56 y.o.   MRN: [id number]  HPI She is here for a physical exam.     Medications and allergies reviewed with patient and updated if appropriate.  Patient Active Problem List   Diagnosis Date Noted  . Hyperglycemia 11/04/2017  . Vitamin D deficiency 11/06/2016  . Breast cancer of upper-inner quadrant of right female breast ([place]) 10/16/2015  . Generalized anxiety disorder 01/04/2013  . Hypothyroidism   . Insomnia 01/20/2012  . Snoring 01/20/2012    Current Outpatient Medications on File Prior to Visit  Medication Sig Dispense Refill  . anastrozole (ARIMIDEX) 1 MG tablet Take 1 tablet (1 mg total) by mouth daily. 90 tablet 3  . aspirin 81 MG tablet Take 81 mg by mouth daily.    . citalopram (CELEXA) 20 MG tablet Take 1 tablet (20 mg total) by mouth daily. 90 tablet 3  . eszopiclone (LUNESTA) 2 MG TABS tablet Take 1 tablet (2 mg total) by mouth at bedtime as needed for sleep. Take immediately before bedtime 90 tablet 0  . levothyroxine (SYNTHROID, LEVOTHROID) 75 MCG tablet TAKE 1 TABLET BY MOUTH  DAILY BEFORE BREAKFAST 90 tablet 3  . Multiple Vitamin (MULTIVITAMIN) capsule Take 1 capsule by mouth daily.     No current facility-administered medications on file prior to visit.     Past Medical History:  Diagnosis Date  . Anxiety    situational  . Breast cancer ([place])    R DCIS, on bx 10/11/15, s/p lumpectomy 11/16/15  . History of chicken pox   . History of colon polyps   . Hypothyroid     Past Surgical History:  Procedure Laterality Date  . BREAST BIOPSY    . BREAST EXCISIONAL BIOPSY Left 1998  . BREAST LUMPECTOMY Right 2016  . BREAST SURGERY  1998   fibroid adenoma   . RADIOACTIVE SEED GUIDED PARTIAL MASTECTOMY WITH AXILLARY SENTINEL LYMPH NODE BIOPSY Right 11/16/2015   Procedure: RADIOACTIVE SEED GUIDED PARTIAL MASTECTOMY WITH AXILLARY SENTINEL LYMPH NODE BIOPSY;  Surgeon: Erroll Luna,  MD;  Location: [facility];  Service: General;  Laterality: Right;    Social History   Socioeconomic History  . Marital status: Divorced    Spouse name: None  . Number of children: 3  . Years of education: None  . Highest education level: None  Social Needs  . Financial resource strain: None  . Food insecurity - worry: None  . Food insecurity - inability: None  . Transportation needs - medical: None  . Transportation needs - non-medical: None  Occupational History  . Occupation: Engineer, maintenance    Comment: Pharmacist, community  Tobacco Use  . Smoking status: Never Smoker  . Smokeless tobacco: Never Used  Substance and Sexual Activity  . Alcohol use: Yes    Comment: 1 to 2 glasses of wine a week   . Drug use: No  . Sexual activity: None  Other Topics Concern  . None  Social History Narrative  . None    Family History  Problem Relation Age of Onset  . Colon cancer Paternal Grandfather        died age 54  . Hypertension Mother 72  . Hyperlipidemia Mother   . Thyroid nodules Mother   . Diabetes Father 37  . Hyperlipidemia Father   . Hypertension Father   . GER disease Father   . Coronary artery disease Father  angioplasty age 44s  . Atrial fibrillation Father        on coumadin  . Gout Brother   . Thyroid cancer Sister     Review of Systems  Constitutional: Negative for chills, fatigue and fever.  HENT: Positive for congestion. Negative for ear pain and sore throat.   Eyes: Negative for visual disturbance.  Respiratory: Negative for cough, shortness of breath and wheezing.   Cardiovascular: Negative for chest pain, palpitations and leg swelling.  Gastrointestinal: Negative for abdominal pain, blood in stool, constipation, diarrhea and nausea.       Rare gerd  Genitourinary: Negative for dysuria and hematuria.  Musculoskeletal: Positive for neck pain. Negative for arthralgias and back pain.  Skin: Negative for color change and rash.    Neurological: Negative for light-headedness and headaches.  Psychiatric/Behavioral: Negative for dysphoric mood. The patient is nervous/anxious (controlled).        Objective:   Vitals:   11/07/17 0808  BP: 122/84  Pulse: 65  Resp: 16  Temp: 97.6 F (36.4 C)  SpO2: 98%   Filed Weights   11/07/17 0808  Weight: 142 lb (64.4 kg)   Body mass index is 23.63 kg/m.  Wt Readings from Last 3 Encounters:  11/07/17 142 lb (64.4 kg)  08/19/17 139 lb 6.4 oz (63.2 kg)  05/19/17 139 lb 8 oz (63.3 kg)     Physical Exam Constitutional: She appears well-developed and well-nourished. No distress.  HENT:  Head: Normocephalic and atraumatic.  Right Ear: External ear normal. Normal ear canal and TM Left Ear: External ear normal.  Normal ear canal and TM Mouth/Throat: Oropharynx is clear and moist.  Eyes: Conjunctivae and EOM are normal.  Neck: Neck supple. No tracheal deviation present. No thyromegaly present.  No carotid bruit  Cardiovascular: Normal rate, regular rhythm and normal heart sounds.   No murmur heard.  No edema. Pulmonary/Chest: Effort normal and breath sounds normal. No respiratory distress. She has no wheezes. She has no rales.  Breast: deferred to Gyn Abdominal: Soft. She exhibits no distension. There is no tenderness.  Lymphadenopathy: She has no cervical adenopathy.  Skin: Skin is warm and dry. She is not diaphoretic.  Psychiatric: She has a normal mood and affect. Her behavior is normal.        Assessment & Plan:   Physical exam: Screening blood work    ordered Immunizations    Discussed shingrix, others up to date Colonoscopy    Up to date  Mammogram   Up to date  Gyn  Up to date  Eye exams   Up to date  EKG  None of file Exercise   Running, weights Weight   Normal BMI Skin    No concerns - sees derm annually Substance abuse    none  See Problem List for Assessment and Plan of chronic medical problems.   FU in one year

## 2017-11-04 NOTE — Patient Instructions (Addendum)
Test(s) ordered today. Your results will be released to [place] (or called to you) after review, usually within 72hours after test completion. If any changes need to be made, you will be notified at that same time.  All other Health Maintenance issues reviewed.   All recommended immunizations and age-appropriate screenings are up-to-date or discussed.  No immunizations administered today.   Medications reviewed and updated.  No changes recommended at this time.  Your prescription(s) have been submitted to your pharmacy. Please take as directed and contact our office if you believe you are having problem(s) with the medication(s).  A thyroid ultrasound was ordered.  Someone will call you to schedule this.   Please followup in one year    Health Maintenance, Female Adopting a healthy lifestyle and getting preventive care can go a long way to promote health and wellness. Talk with your health care provider about what schedule of regular examinations is right for you. This is a good chance for you to check in with your provider about disease prevention and staying healthy. In between checkups, there are plenty of things you can do on your own. Experts have done a lot of research about which lifestyle changes and preventive measures are most likely to keep you healthy. Ask your health care provider for more information. Weight and diet Eat a healthy diet  Be sure to include plenty of vegetables, fruits, low-fat dairy products, and lean protein.  Do not eat a lot of foods high in solid fats, added sugars, or salt.  Get regular exercise. This is one of the most important things you can do for your health. ? Most adults should exercise for at least 150 minutes each week. The exercise should increase your heart rate and make you sweat (moderate-intensity exercise). ? Most adults should also do strengthening exercises at least twice a week. This is in addition to the moderate-intensity  exercise.  Maintain a healthy weight  Body mass index (BMI) is a measurement that can be used to identify possible weight problems. It estimates body fat based on height and weight. Your health care provider can help determine your BMI and help you achieve or maintain a healthy weight.  For females 32 years of age and older: ? A BMI below 18.5 is considered underweight. ? A BMI of 18.5 to 24.9 is normal. ? A BMI of 25 to 29.9 is considered overweight. ? A BMI of 30 and above is considered obese.  Watch levels of cholesterol and blood lipids  You should start having your blood tested for lipids and cholesterol at 57 years of age, then have this test every 5 years.  You may need to have your cholesterol levels checked more often if: ? Your lipid or cholesterol levels are high. ? You are older than 57 years of age. ? You are at high risk for heart disease.  Cancer screening Lung Cancer  Lung cancer screening is recommended for adults 36-108 years old who are at high risk for lung cancer because of a history of smoking.  A yearly low-dose CT scan of the lungs is recommended for people who: ? Currently smoke. ? Have quit within the past 15 years. ? Have at least a 30-pack-year history of smoking. A pack year is smoking an average of one pack of cigarettes a day for 1 year.  Yearly screening should continue until it has been 15 years since you quit.  Yearly screening should stop if you develop a health problem that  would prevent you from having lung cancer treatment.  Breast Cancer  Practice breast self-awareness. This means understanding how your breasts normally appear and feel.  It also means doing regular breast self-exams. Let your health care provider know about any changes, no matter how small.  If you are in your 20s or 30s, you should have a clinical breast exam (CBE) by a health care provider every 1-3 years as part of a regular health exam.  If you are 53 or older, have  a CBE every year. Also consider having a breast X-ray (mammogram) every year.  If you have a family history of breast cancer, talk to your health care provider about genetic screening.  If you are at high risk for breast cancer, talk to your health care provider about having an MRI and a mammogram every year.  Breast cancer gene (BRCA) assessment is recommended for women who have family members with BRCA-related cancers. BRCA-related cancers include: ? Breast. ? Ovarian. ? Tubal. ? Peritoneal cancers.  Results of the assessment will determine the need for genetic counseling and BRCA1 and BRCA2 testing.  Cervical Cancer Your health care provider may recommend that you be screened regularly for cancer of the pelvic organs (ovaries, uterus, and vagina). This screening involves a pelvic examination, including checking for microscopic changes to the surface of your cervix (Pap test). You may be encouraged to have this screening done every 3 years, beginning at age 21.  For women ages 20-65, health care providers may recommend pelvic exams and Pap testing every 3 years, or they may recommend the Pap and pelvic exam, combined with testing for human papilloma virus (HPV), every 5 years. Some types of HPV increase your risk of cervical cancer. Testing for HPV may also be done on women of any age with unclear Pap test results.  Other health care providers may not recommend any screening for nonpregnant women who are considered low risk for pelvic cancer and who do not have symptoms. Ask your health care provider if a screening pelvic exam is right for you.  If you have had past treatment for cervical cancer or a condition that could lead to cancer, you need Pap tests and screening for cancer for at least 20 years after your treatment. If Pap tests have been discontinued, your risk factors (such as having a new sexual partner) need to be reassessed to determine if screening should resume. Some women have  medical problems that increase the chance of getting cervical cancer. In these cases, your health care provider may recommend more frequent screening and Pap tests.  Colorectal Cancer  This type of cancer can be detected and often prevented.  Routine colorectal cancer screening usually begins at 57 years of age and continues through 57 years of age.  Your health care provider may recommend screening at an earlier age if you have risk factors for colon cancer.  Your health care provider may also recommend using home test kits to check for hidden blood in the stool.  A small camera at the end of a tube can be used to examine your colon directly (sigmoidoscopy or colonoscopy). This is done to check for the earliest forms of colorectal cancer.  Routine screening usually begins at age 20.  Direct examination of the colon should be repeated every 5-10 years through 57 years of age. However, you may need to be screened more often if early forms of precancerous polyps or small growths are found.  Skin Cancer  Check  your skin from head to toe regularly.  Tell your health care provider about any new moles or changes in moles, especially if there is a change in a mole's shape or color.  Also tell your health care provider if you have a mole that is larger than the size of a pencil eraser.  Always use sunscreen. Apply sunscreen liberally and repeatedly throughout the day.  Protect yourself by wearing long sleeves, pants, a wide-brimmed hat, and sunglasses whenever you are outside.  Heart disease, diabetes, and high blood pressure  High blood pressure causes heart disease and increases the risk of stroke. High blood pressure is more likely to develop in: ? People who have blood pressure in the high end of the normal range (130-139/85-89 mm Hg). ? People who are overweight or obese. ? People who are African American.  If you are 9-23 years of age, have your blood pressure checked every 3-5  years. If you are 58 years of age or older, have your blood pressure checked every year. You should have your blood pressure measured twice-once when you are at a hospital or clinic, and once when you are not at a hospital or clinic. Record the average of the two measurements. To check your blood pressure when you are not at a hospital or clinic, you can use: ? An automated blood pressure machine at a pharmacy. ? A home blood pressure monitor.  If you are between 31 years and 26 years old, ask your health care provider if you should take aspirin to prevent strokes.  Have regular diabetes screenings. This involves taking a blood sample to check your fasting blood sugar level. ? If you are at a normal weight and have a low risk for diabetes, have this test once every three years after 57 years of age. ? If you are overweight and have a high risk for diabetes, consider being tested at a younger age or more often. Preventing infection Hepatitis B  If you have a higher risk for hepatitis B, you should be screened for this virus. You are considered at high risk for hepatitis B if: ? You were born in a country where hepatitis B is common. Ask your health care provider which countries are considered high risk. ? Your parents were born in a high-risk country, and you have not been immunized against hepatitis B (hepatitis B vaccine). ? You have HIV or AIDS. ? You use needles to inject street drugs. ? You live with someone who has hepatitis B. ? You have had sex with someone who has hepatitis B. ? You get hemodialysis treatment. ? You take certain medicines for conditions, including cancer, organ transplantation, and autoimmune conditions.  Hepatitis C  Blood testing is recommended for: ? Everyone born from [birth year] through 1966. ? Anyone with known risk factors for hepatitis C.  Sexually transmitted infections (STIs)  You should be screened for sexually transmitted infections (STIs) including  gonorrhea and chlamydia if: ? You are sexually active and are younger than 57 years of age. ? You are older than 57 years of age and your health care provider tells you that you are at risk for this type of infection. ? Your sexual activity has changed since you were last screened and you are at an increased risk for chlamydia or gonorrhea. Ask your health care provider if you are at risk.  If you do not have HIV, but are at risk, it may be recommended that you take a prescription medicine  daily to prevent HIV infection. This is called pre-exposure prophylaxis (PrEP). You are considered at risk if: ? You are sexually active and do not regularly use condoms or know the HIV status of your partner(s). ? You take drugs by injection. ? You are sexually active with a partner who has HIV.  Talk with your health care provider about whether you are at high risk of being infected with HIV. If you choose to begin PrEP, you should first be tested for HIV. You should then be tested every 3 months for as long as you are taking PrEP. Pregnancy  If you are premenopausal and you may become pregnant, ask your health care provider about preconception counseling.  If you may become pregnant, take 400 to 800 micrograms (mcg) of folic acid every day.  If you want to prevent pregnancy, talk to your health care provider about birth control (contraception). Osteoporosis and menopause  Osteoporosis is a disease in which the bones lose minerals and strength with aging. This can result in serious bone fractures. Your risk for osteoporosis can be identified using a bone density scan.  If you are 24 years of age or older, or if you are at risk for osteoporosis and fractures, ask your health care provider if you should be screened.  Ask your health care provider whether you should take a calcium or vitamin D supplement to lower your risk for osteoporosis.  Menopause may have certain physical symptoms and  risks.  Hormone replacement therapy may reduce some of these symptoms and risks. Talk to your health care provider about whether hormone replacement therapy is right for you. Follow these instructions at home:  Schedule regular health, dental, and eye exams.  Stay current with your immunizations.  Do not use any tobacco products including cigarettes, chewing tobacco, or electronic cigarettes.  If you are pregnant, do not drink alcohol.  If you are breastfeeding, limit how much and how often you drink alcohol.  Limit alcohol intake to no more than 1 drink per day for nonpregnant women. One drink equals 12 ounces of beer, 5 ounces of wine, or 1 ounces of hard liquor.  Do not use street drugs.  Do not share needles.  Ask your health care provider for help if you need support or information about quitting drugs.  Tell your health care provider if you often feel depressed.  Tell your health care provider if you have ever been abused or do not feel safe at home. This information is not intended to replace advice given to you by your health care provider. Make sure you discuss any questions you have with your health care provider. Document Released: 05/27/2011 Document Revised: 04/18/2016 Document Reviewed: 08/15/2015 Elsevier Interactive Patient Education  [employer].

## 2017-11-06 ENCOUNTER — Encounter: Payer: Self-pay | Admitting: Internal Medicine

## 2017-11-07 ENCOUNTER — Other Ambulatory Visit (INDEPENDENT_AMBULATORY_CARE_PROVIDER_SITE_OTHER): Payer: 59

## 2017-11-07 ENCOUNTER — Ambulatory Visit (INDEPENDENT_AMBULATORY_CARE_PROVIDER_SITE_OTHER): Payer: 59 | Admitting: Internal Medicine

## 2017-11-07 ENCOUNTER — Encounter: Payer: Self-pay | Admitting: Internal Medicine

## 2017-11-07 VITALS — BP 122/84 | HR 65 | Temp 97.6°F | Resp 16 | Ht 65.0 in | Wt 142.0 lb

## 2017-11-07 DIAGNOSIS — E041 Nontoxic single thyroid nodule: Secondary | ICD-10-CM

## 2017-11-07 DIAGNOSIS — F411 Generalized anxiety disorder: Secondary | ICD-10-CM | POA: Diagnosis not present

## 2017-11-07 DIAGNOSIS — Z0000 Encounter for general adult medical examination without abnormal findings: Secondary | ICD-10-CM

## 2017-11-07 DIAGNOSIS — E039 Hypothyroidism, unspecified: Secondary | ICD-10-CM

## 2017-11-07 DIAGNOSIS — R739 Hyperglycemia, unspecified: Secondary | ICD-10-CM | POA: Diagnosis not present

## 2017-11-07 DIAGNOSIS — G4700 Insomnia, unspecified: Secondary | ICD-10-CM | POA: Diagnosis not present

## 2017-11-07 LAB — COMPREHENSIVE METABOLIC PANEL
ALT: 21 U/L (ref 0–35)
AST: 20 U/L (ref 0–37)
Albumin: 4.5 g/dL (ref 3.5–5.2)
Alkaline Phosphatase: 38 U/L — ABNORMAL LOW (ref 39–117)
BUN: 12 mg/dL (ref 6–23)
CO2: 30 mEq/L (ref 19–32)
Calcium: 9.7 mg/dL (ref 8.4–10.5)
Chloride: 103 mEq/L (ref 96–112)
Creatinine, Ser: 0.74 mg/dL (ref 0.40–1.20)
GFR: 85.96 mL/min (ref >60.00)
Glucose, Bld: 95 mg/dL (ref 70–99)
Potassium: 4.2 mEq/L (ref 3.5–5.1)
Sodium: 140 mEq/L (ref 135–145)
Total Bilirubin: 0.5 mg/dL (ref 0.2–1.2)
Total Protein: 7 g/dL (ref 6.0–8.3)

## 2017-11-07 LAB — LIPID PANEL
Cholesterol: 259 mg/dL — ABNORMAL HIGH (ref 0–200)
HDL: 66.7 mg/dL (ref >39.00)
LDL Cholesterol: 175 mg/dL — ABNORMAL HIGH (ref 0–99)
NonHDL: 191.92
Total CHOL/HDL Ratio: 4
Triglycerides: 84 mg/dL (ref 0.0–149.0)
VLDL: 16.8 mg/dL (ref 0.0–40.0)

## 2017-11-07 LAB — CBC WITH DIFFERENTIAL/PLATELET
Basophils Absolute: 0 10*3/uL (ref 0.0–0.1)
Basophils Relative: 0.2 % (ref 0.0–3.0)
Eosinophils Absolute: 0.2 10*3/uL (ref 0.0–0.7)
Eosinophils Relative: 6.1 % — ABNORMAL HIGH (ref 0.0–5.0)
HCT: 40 % (ref 36.0–46.0)
Hemoglobin: 13.1 g/dL (ref 12.0–15.0)
Lymphocytes Relative: 28.3 % (ref 12.0–46.0)
Lymphs Abs: 0.9 10*3/uL (ref 0.7–4.0)
MCHC: 32.8 g/dL (ref 30.0–36.0)
MCV: 93.8 fl (ref 78.0–100.0)
Monocytes Absolute: 0.4 10*3/uL (ref 0.1–1.0)
Monocytes Relative: 12.7 % — ABNORMAL HIGH (ref 3.0–12.0)
Neutro Abs: 1.8 10*3/uL (ref 1.4–7.7)
Neutrophils Relative %: 52.7 % (ref 43.0–77.0)
Platelets: 295 10*3/uL (ref 150.0–400.0)
RBC: 4.27 Mil/uL (ref 3.87–5.11)
RDW: 13.3 % (ref 11.5–15.5)
WBC: 3.3 10*3/uL — ABNORMAL LOW (ref 4.0–10.5)

## 2017-11-07 LAB — HEMOGLOBIN A1C: Hgb A1c MFr Bld: 5.9 % (ref 4.6–6.5)

## 2017-11-07 LAB — TSH: TSH: 0.94 u[IU]/mL (ref 0.35–4.50)

## 2017-11-07 MED ORDER — ESZOPICLONE 2 MG PO TABS: 2.0000 mg | ORAL_TABLET | Freq: Every evening | ORAL | 0 refills | Status: DC | PRN

## 2017-11-07 NOTE — Assessment & Plan Note (Signed)
Possible nodule on exam Hypothyroid and fam hx of thyroid cancer tsh Thyroid US

## 2017-11-07 NOTE — Assessment & Plan Note (Signed)
Controlled, stable Continue current dose of medication  

## 2017-11-07 NOTE — Assessment & Plan Note (Signed)
Rarely takes lunesta - will refill and she will use rarely

## 2017-11-07 NOTE — Assessment & Plan Note (Addendum)
Check tsh, lipid Titrate med dose if needed

## 2017-11-07 NOTE — Assessment & Plan Note (Signed)
Check a1c, cmp

## 2017-11-08 ENCOUNTER — Encounter: Payer: Self-pay | Admitting: Internal Medicine

## 2017-11-08 DIAGNOSIS — R7303 Prediabetes: Secondary | ICD-10-CM | POA: Insufficient documentation

## 2017-11-08 DIAGNOSIS — E785 Hyperlipidemia, unspecified: Secondary | ICD-10-CM | POA: Insufficient documentation

## 2017-11-11 DIAGNOSIS — M542 Cervicalgia: Secondary | ICD-10-CM | POA: Diagnosis not present

## 2017-11-20 ENCOUNTER — Other Ambulatory Visit: Payer: Self-pay | Admitting: Internal Medicine

## 2017-11-24 ENCOUNTER — Other Ambulatory Visit: Payer: Self-pay | Admitting: Internal Medicine

## 2017-11-27 DIAGNOSIS — M5031 Other cervical disc degeneration,  high cervical region: Secondary | ICD-10-CM | POA: Diagnosis not present

## 2017-11-27 DIAGNOSIS — M542 Cervicalgia: Secondary | ICD-10-CM | POA: Diagnosis not present

## 2017-12-10 DIAGNOSIS — M542 Cervicalgia: Secondary | ICD-10-CM | POA: Diagnosis not present

## 2017-12-10 DIAGNOSIS — M5031 Other cervical disc degeneration,  high cervical region: Secondary | ICD-10-CM | POA: Diagnosis not present

## 2017-12-15 ENCOUNTER — Ambulatory Visit
Admission: RE | Admit: 2017-12-15 | Discharge: 2017-12-15 | Disposition: A | Discharge status: Home / Self Care | Payer: 59 | Source: Ambulatory Visit | Attending: Internal Medicine | Admitting: Internal Medicine

## 2017-12-15 DIAGNOSIS — E039 Hypothyroidism, unspecified: Secondary | ICD-10-CM

## 2017-12-15 DIAGNOSIS — E041 Nontoxic single thyroid nodule: Secondary | ICD-10-CM

## 2017-12-17 ENCOUNTER — Telehealth: Payer: Self-pay | Admitting: Internal Medicine

## 2017-12-17 DIAGNOSIS — E039 Hypothyroidism, unspecified: Secondary | ICD-10-CM

## 2017-12-17 NOTE — Telephone Encounter (Signed)
Copied from [place]. Topic: Quick Communication - Other Results >> Dec 17, 2017  4:31 PM Cecelia Byars, NT wrote: Reason for CRM: Pate[facility] would like results from recent testing please advise

## 2017-12-18 NOTE — Telephone Encounter (Signed)
Pt is requesting results from thyroid US

## 2017-12-18 NOTE — Telephone Encounter (Signed)
LVM informing pt

## 2017-12-18 NOTE — Telephone Encounter (Signed)
thyroid gland is normal.  There are no nodules.

## 2018-02-11 DIAGNOSIS — Z6823 Body mass index (BMI) 23.0-23.9, adult: Secondary | ICD-10-CM | POA: Diagnosis not present

## 2018-02-11 DIAGNOSIS — Z01419 Encounter for gynecological examination (general) (routine) without abnormal findings: Secondary | ICD-10-CM | POA: Diagnosis not present

## 2018-02-25 ENCOUNTER — Ambulatory Visit (INDEPENDENT_AMBULATORY_CARE_PROVIDER_SITE_OTHER): Payer: 59

## 2018-02-25 DIAGNOSIS — Z23 Encounter for immunization: Secondary | ICD-10-CM | POA: Diagnosis not present

## 2018-02-25 NOTE — Progress Notes (Signed)
Injection given.   Moustafa Mossa J Kashden Deboy, MD  

## 2018-03-31 ENCOUNTER — Other Ambulatory Visit: Payer: Self-pay | Admitting: Hematology and Oncology

## 2018-04-29 DIAGNOSIS — Z1283 Encounter for screening for malignant neoplasm of skin: Secondary | ICD-10-CM | POA: Diagnosis not present

## 2018-04-29 DIAGNOSIS — L578 Other skin changes due to chronic exposure to nonionizing radiation: Secondary | ICD-10-CM | POA: Diagnosis not present

## 2018-04-29 DIAGNOSIS — L821 Other seborrheic keratosis: Secondary | ICD-10-CM | POA: Diagnosis not present

## 2018-05-04 ENCOUNTER — Ambulatory Visit (INDEPENDENT_AMBULATORY_CARE_PROVIDER_SITE_OTHER): Payer: 59 | Admitting: *Deleted

## 2018-05-04 DIAGNOSIS — Z23 Encounter for immunization: Secondary | ICD-10-CM | POA: Diagnosis not present

## 2018-07-17 ENCOUNTER — Ambulatory Visit: Payer: 59 | Admitting: Internal Medicine

## 2018-07-17 ENCOUNTER — Encounter: Payer: Self-pay | Admitting: Internal Medicine

## 2018-07-17 VITALS — BP 120/72 | HR 88 | Temp 99.9°F | Resp 16 | Ht 65.0 in | Wt 134.8 lb

## 2018-07-17 DIAGNOSIS — B349 Viral infection, unspecified: Secondary | ICD-10-CM | POA: Diagnosis not present

## 2018-07-17 NOTE — Progress Notes (Signed)
Subjective:    Patient ID: Jeanne Parker, female    DOB: [birth year], 57 y.o.   MRN: [id number]  HPI She is here for an acute visit for cold symptoms.  Her symptoms started yesterday.  Her son was sick last week.   She is experiencing aches in back, neck and body.  She had a temp of 101.8 yesterday.  She states chills, nasal congestion, sinus pain, sore throat, dry cough and headaches.  She denies any ear pain, shortness of breath, wheezing, nausea or diarrhea.  She has taken aleve last night and it did help.  She does feel better today than last night.  Medications and allergies reviewed with patient and updated if appropriate.  Patient Active Problem List   Diagnosis Date Noted  . Prediabetes 11/08/2017  . Hyperlipidemia 11/08/2017  . Vitamin D deficiency 11/06/2016  . Breast cancer of upper-inner quadrant of right female breast ([place]) 10/16/2015  . Generalized anxiety disorder 01/04/2013  . Hypothyroidism   . Insomnia 01/20/2012  . Snoring 01/20/2012    Current Outpatient Medications on File Prior to Visit  Medication Sig Dispense Refill  . anastrozole (ARIMIDEX) 1 MG tablet TAKE 1 TABLET BY MOUTH  DAILY 90 tablet 3  . aspirin 81 MG tablet Take 81 mg by mouth daily.    . citalopram (CELEXA) 20 MG tablet TAKE 1 TABLET BY MOUTH  DAILY 90 tablet 3  . eszopiclone (LUNESTA) 2 MG TABS tablet Take 1 tablet (2 mg total) by mouth at bedtime as needed for sleep. Take immediately before bedtime 90 tablet 0  . levothyroxine (SYNTHROID, LEVOTHROID) 75 MCG tablet TAKE 1 TABLET BY MOUTH  DAILY BEFORE BREAKFAST 90 tablet 3  . Multiple Vitamin (MULTIVITAMIN) capsule Take 1 capsule by mouth daily.     No current facility-administered medications on file prior to visit.     Past Medical History:  Diagnosis Date  . Anxiety    situational  . 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 History of chicken pox   . History of colon polyps   .  Hypothyroid     Past Surgical History:  Procedure Laterality Date  . BREAST BIOPSY    . BREAST EXCISIONAL BIOPSY Left 1998  . BREAST LUMPECTOMY Right 2016  . BREAST SURGERY  1998   fibroid adenoma   . RADIOACTIVE SEED GUIDED PARTIAL MASTECTOMY WITH AXILLARY SENTINEL LYMPH NODE BIOPSY Right 11/16/2015   Procedure: RADIOACTIVE SEED GUIDED PARTIAL MASTECTOMY WITH AXILLARY SENTINEL LYMPH NODE BIOPSY;  Surgeon: Erroll Luna, MD;  Location: [facility];  Service: General;  Laterality: Right;    Social History   Socioeconomic History  . Marital status: Divorced    Spouse name: Not on file  . Number of children: 3  . Years of education: Not on file  . Highest education level: Not on file  Occupational History  . Occupation: Engineer, maintenance    Comment: Pharmacist, community  Social Needs  . Financial resource strain: Not on file  . Food insecurity:    Worry: Not on file    Inability: Not on file  . Transportation needs:    Medical: Not on file    Non-medical: Not on file  Tobacco Use  . Smoking status: Never Smoker  . Smokeless tobacco: Never Used  Substance and Sexual Activity  . Alcohol use: Yes    Comment: 1 to 2 glasses of wine a week   .  Drug use: No  . Sexual activity: Not on file  Lifestyle  . Physical activity:    Days per week: Not on file    Minutes per session: Not on file  . Stress: Not on file  Relationships  . Social connections:    Talks on phone: Not on file    Gets together: Not on file    Attends religious service: Not on file    Active member of club or organization: Not on file    Attends meetings of clubs or organizations: Not on file    Relationship status: Not on file  Other Topics Concern  . Not on file  Social History Narrative  . Not on file    Family History  Problem Relation Age of Onset  . Colon cancer Paternal Grandfather        died age 21  . Hypertension Mother 41  . Hyperlipidemia Mother   . Thyroid nodules  Mother   . Diabetes Father 81  . Hyperlipidemia Father   . Hypertension Father   . GER disease Father   . Coronary artery disease Father        angioplasty age 63s  . Atrial fibrillation Father        on coumadin  . Gout Brother   . Thyroid cancer Sister     Review of Systems  Constitutional: Positive for chills and fever.  HENT: Positive for congestion, sinus pain and sore throat. Negative for ear pain.        Swollen glands in neck  Respiratory: Positive for cough (dry). Negative for shortness of breath and wheezing.   Gastrointestinal: Negative for abdominal pain, diarrhea and nausea.  Musculoskeletal: Positive for myalgias.  Neurological: Positive for headaches. Negative for dizziness and light-headedness.       Objective:   Vitals:   07/17/18 1423  BP: 120/72  Pulse: 88  Resp: 16  Temp: 99.9 F (37.7 C)  SpO2: 98%   Filed Weights   07/17/18 1423  Weight: 134 lb 12.8 oz (61.1 kg)   Body mass index is 22.43 kg/m.  Wt Readings from Last 3 Encounters:  07/17/18 134 lb 12.8 oz (61.1 kg)  11/07/17 142 lb (64.4 kg)  08/19/17 139 lb 6.4 oz (63.2 kg)     Physical Exam GENERAL APPEARANCE: Appears stated age, well appearing, NAD EYES: conjunctiva clear, no icterus HEENT: bilateral tympanic membranes and ear canals normal, oropharynx with no erythema, no thyromegaly, trachea midline, no cervical or supraclavicular lymphadenopathy LUNGS: Clear to auscultation without wheeze or crackles, unlabored breathing, good air entry bilaterally CARDIOVASCULAR: Normal S1,S2 without murmurs, no edema SKIN: warm, dry        Assessment & Plan:   See Problem List for Assessment and Plan of chronic medical problems.

## 2018-07-17 NOTE — Assessment & Plan Note (Signed)
Illness likely viral in nature Continue symptomatic treatment, which we discussed Rest, fluids Call if no improvement or if symptoms worsen

## 2018-07-17 NOTE — Patient Instructions (Signed)
You likely have a viral infection.  Call if no improvement.  Take the aleve and other over the counter cold medications.  Get plenty of rest and fluids.    Upper Respiratory Infection, Adult Most upper respiratory infections (URIs) are a viral infection of the air passages leading to the lungs. A URI affects the nose, throat, and upper air passages. The most common type of URI is nasopharyngitis and is typically referred to as "the common cold." URIs run their course and usually go away on their own. Most of the time, a URI does not require medical attention, but sometimes a bacterial infection in the upper airways can follow a viral infection. This is called a secondary infection. Sinus and middle ear infections are common types of secondary upper respiratory infections. Bacterial pneumonia can also complicate a URI. A URI can worsen asthma and chronic obstructive pulmonary disease (COPD). Sometimes, these complications can require emergency medical care and may be life threatening. What are the causes? Almost all URIs are caused by viruses. A virus is a type of germ and can spread from one person to another. What increases the risk? You may be at risk for a URI if:  You smoke.  You have chronic heart or lung disease.  You have a weakened defense (immune) system.  You are very young or very old.  You have nasal allergies or asthma.  You work in crowded or poorly ventilated areas.  You work in health care facilities or schools.  What are the signs or symptoms? Symptoms typically develop 2-3 days after you come in contact with a cold virus. Most viral URIs last 7-10 days. However, viral URIs from the influenza virus (flu virus) can last 14-18 days and are typically more severe. Symptoms may include:  Runny or stuffy (congested) nose.  Sneezing.  Cough.  Sore throat.  Headache.  Fatigue.  Fever.  Loss of appetite.  Pain in your forehead, behind your eyes, and over your  cheekbones (sinus pain).  Muscle aches.  How is this diagnosed? Your health care provider may diagnose a URI by:  Physical exam.  Tests to check that your symptoms are not due to another condition such as: ? Strep throat. ? Sinusitis. ? Pneumonia. ? Asthma.  How is this treated? A URI goes away on its own with time. It cannot be cured with medicines, but medicines may be prescribed or recommended to relieve symptoms. Medicines may help:  Reduce your fever.  Reduce your cough.  Relieve nasal congestion.  Follow these instructions at home:  Take medicines only as directed by your health care provider.  Gargle warm saltwater or take cough drops to comfort your throat as directed by your health care provider.  Use a warm mist humidifier or inhale steam from a shower to increase air moisture. This may make it easier to breathe.  Drink enough fluid to keep your urine clear or pale yellow.  Eat soups and other clear broths and maintain good nutrition.  Rest as needed.  Return to work when your temperature has returned to normal or as your health care provider advises. You may need to stay home longer to avoid infecting others. You can also use a face mask and careful hand washing to prevent spread of the virus.  Increase the usage of your inhaler if you have asthma.  Do not use any tobacco products, including cigarettes, chewing tobacco, or electronic cigarettes. If you need help quitting, ask your health care provider. How  is this prevented? The best way to protect yourself from getting a cold is to practice good hygiene.  Avoid oral or hand contact with people with cold symptoms.  Wash your hands often if contact occurs.  There is no clear evidence that vitamin C, vitamin E, echinacea, or exercise reduces the chance of developing a cold. However, it is always recommended to get plenty of rest, exercise, and practice good nutrition. Contact a health care provider  if:  You are getting worse rather than better.  Your symptoms are not controlled by medicine.  You have chills.  You have worsening shortness of breath.  You have brown or red mucus.  You have yellow or brown nasal discharge.  You have pain in your face, especially when you bend forward.  You have a fever.  You have swollen neck glands.  You have pain while swallowing.  You have white areas in the back of your throat. Get help right away if:  You have severe or persistent: ? Headache. ? Ear pain. ? Sinus pain. ? Chest pain.  You have chronic lung disease and any of the following: ? Wheezing. ? Prolonged cough. ? Coughing up blood. ? A change in your usual mucus.  You have a stiff neck.  You have changes in your: ? Vision. ? Hearing. ? Thinking. ? Mood. This information is not intended to replace advice given to you by your health care provider. Make sure you discuss any questions you have with your health care provider. Document Released: 05/07/2001 Document Revised: 07/14/2016 Document Reviewed: 02/16/2014 Elsevier Interactive Patient Education  [employer].

## 2018-07-21 ENCOUNTER — Telehealth: Payer: Self-pay | Admitting: Internal Medicine

## 2018-07-21 ENCOUNTER — Encounter: Payer: Self-pay | Admitting: Internal Medicine

## 2018-07-21 MED ORDER — POLYMYXIN B-TRIMETHOPRIM 10000-0.1 UNIT/ML-% OP SOLN: 1.0000 [drp] | OPHTHALMIC | 0 refills | Status: DC

## 2018-07-21 NOTE — Telephone Encounter (Signed)
Please advise 

## 2018-07-21 NOTE — Telephone Encounter (Signed)
Copied from [place]. Topic: Quick Communication - See Telephone Encounter >> Jul 21, 2018  9:43 AM Sheran Luz wrote: CRM for notification. See Telephone encounter for: 07/21/18.  Pt called stating that she was seen 8/23 and her symptoms have improved a bit but now her eyes are red and sore. Pt states that she still does not feel "great" and would like to know if Dr. Quay Burow could call her in an antibiotic. Pt declined an appointment. Pt would like a call back at [phone]

## 2018-07-21 NOTE — Telephone Encounter (Signed)
Eye antibiotic pending

## 2018-07-21 NOTE — Telephone Encounter (Signed)
Pt aware.

## 2018-08-11 ENCOUNTER — Encounter: Payer: Self-pay | Admitting: Internal Medicine

## 2018-08-11 ENCOUNTER — Telehealth: Payer: Self-pay | Admitting: Internal Medicine

## 2018-08-11 MED ORDER — CITALOPRAM HYDROBROMIDE 20 MG PO TABS: 20.0000 mg | ORAL_TABLET | Freq: Every day | ORAL | 0 refills | Status: DC

## 2018-08-11 NOTE — Telephone Encounter (Signed)
LVM letting pt know that a 10 day supply of her medication has been sent.

## 2018-08-11 NOTE — Telephone Encounter (Signed)
Copied from [place] [id number]. Topic: General - Other >> Aug 11, 2018  2:38 PM Mcneil, Ja-Kwan wrote: Reason for CRM: Pt states she has been without the citalopram (CELEXA) 20 MG tablet for 2 days now and she will not receive the mail order until next week some time. Pt requests a 10 day supply sent to [facility], [place] AT [address]  [phone] (Phone)  [phone] (Fax) Pt requests call back to advise if the 10 day supply is approved.

## 2018-08-12 ENCOUNTER — Other Ambulatory Visit: Payer: Self-pay

## 2018-08-12 MED ORDER — LEVOTHYROXINE SODIUM 75 MCG PO TABS: ORAL_TABLET | ORAL | 1 refills | Status: DC

## 2018-08-17 ENCOUNTER — Other Ambulatory Visit: Payer: Self-pay | Admitting: *Deleted

## 2018-08-17 DIAGNOSIS — Z170 Estrogen receptor positive status [ER+]: Secondary | ICD-10-CM

## 2018-08-17 DIAGNOSIS — C50211 Malignant neoplasm of upper-inner quadrant of right female breast: Secondary | ICD-10-CM

## 2018-08-18 ENCOUNTER — Inpatient Hospital Stay: Payer: 59 | Attending: Hematology and Oncology | Admitting: Hematology and Oncology

## 2018-08-18 ENCOUNTER — Inpatient Hospital Stay: Payer: 59

## 2018-08-18 ENCOUNTER — Telehealth: Payer: Self-pay | Admitting: Hematology and Oncology

## 2018-08-18 VITALS — BP 122/70 | HR 76 | Temp 98.3°F | Resp 20 | Ht 65.0 in | Wt 131.3 lb

## 2018-08-18 DIAGNOSIS — C50211 Malignant neoplasm of upper-inner quadrant of right female breast: Secondary | ICD-10-CM | POA: Diagnosis not present

## 2018-08-18 DIAGNOSIS — Z23 Encounter for immunization: Secondary | ICD-10-CM | POA: Insufficient documentation

## 2018-08-18 DIAGNOSIS — Z170 Estrogen receptor positive status [ER+]: Secondary | ICD-10-CM | POA: Insufficient documentation

## 2018-08-18 LAB — CBC WITH DIFFERENTIAL (CANCER CENTER ONLY)
Basophils Absolute: 0 10*3/uL (ref 0.0–0.1)
Basophils Relative: 0 %
Eosinophils Absolute: 0.3 10*3/uL (ref 0.0–0.5)
Eosinophils Relative: 6 %
HCT: 36.2 % (ref 34.8–46.6)
Hemoglobin: 12 g/dL (ref 11.6–15.9)
Lymphocytes Relative: 33 %
Lymphs Abs: 1.4 10*3/uL (ref 0.9–3.3)
MCH: 30.8 pg (ref 25.1–34.0)
MCHC: 33.1 g/dL (ref 31.5–36.0)
MCV: 92.8 fL (ref 79.5–101.0)
Monocytes Absolute: 0.3 10*3/uL (ref 0.1–0.9)
Monocytes Relative: 7 %
Neutro Abs: 2.3 10*3/uL (ref 1.5–6.5)
Neutrophils Relative %: 54 %
Platelet Count: 247 10*3/uL (ref 145–400)
RBC: 3.9 MIL/uL (ref 3.70–5.45)
RDW: 13.6 % (ref 11.2–14.5)
WBC Count: 4.4 10*3/uL (ref 3.9–10.3)

## 2018-08-18 LAB — CMP (CANCER CENTER ONLY)
ALT: 15 U/L (ref 0–44)
AST: 16 U/L (ref 15–41)
Albumin: 4.3 g/dL (ref 3.5–5.0)
Alkaline Phosphatase: 57 U/L (ref 38–126)
Anion gap: 7 (ref 5–15)
BUN: 14 mg/dL (ref 6–20)
CO2: 30 mmol/L (ref 22–32)
Calcium: 9.9 mg/dL (ref 8.9–10.3)
Chloride: 100 mmol/L (ref 98–111)
Creatinine: 0.8 mg/dL (ref 0.44–1.00)
GFR, Est AFR Am: >60 mL/min (ref >60)
GFR, Estimated: >60 mL/min (ref >60)
Glucose, Bld: 183 mg/dL — ABNORMAL HIGH (ref 70–99)
Potassium: 4.1 mmol/L (ref 3.5–5.1)
Sodium: 137 mmol/L (ref 135–145)
Total Bilirubin: 0.3 mg/dL (ref 0.3–1.2)
Total Protein: 7.2 g/dL (ref 6.5–8.1)

## 2018-08-18 MED ORDER — TRETINOIN 0.025 % EX CREA: TOPICAL_CREAM | Freq: Every day | CUTANEOUS | 0 refills | Status: AC

## 2018-08-18 MED ORDER — INFLUENZA VAC SPLIT QUAD 0.5 ML IM SUSY
0.5000 mL | PREFILLED_SYRINGE | Freq: Once | INTRAMUSCULAR | Status: AC
  Administered 2018-08-18: 0.5 mL via INTRAMUSCULAR

## 2018-08-18 MED ORDER — ANASTROZOLE 1 MG PO TABS: 1.0000 mg | ORAL_TABLET | Freq: Every day | ORAL | 3 refills | Status: DC

## 2018-08-18 MED ORDER — CYCLOSPORINE 0.05 % OP EMUL: 1.0000 [drp] | Freq: Two times a day (BID) | OPHTHALMIC | Status: DC

## 2018-08-18 MED ORDER — INFLUENZA VAC SPLIT QUAD 0.5 ML IM SUSY
PREFILLED_SYRINGE | INTRAMUSCULAR | Status: AC
  Filled 2018-08-18: qty 0.5

## 2018-08-18 NOTE — Telephone Encounter (Signed)
Pt declined avs and calendar  °

## 2018-08-18 NOTE — Assessment & Plan Note (Signed)
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 Occasional hot flashes and sweats Patient is menopausal and would like to switch to anastrozole. I gave her a new prescription for anastrozole starting today.  Breast Cancer Surveillance: 1. Breast exam  08/18/2018: Benign 2. Mammogram 11/9/2018n abnormalities. Postsurgical changes. Breast Density Category C.  3. Bone density 07/29/17: T score -1.7  Emotional issues: She has done much better when she switched her to evening. Patient is in the process of cutting down and discontinuing Celexa.  Return to clinic in one year.

## 2018-08-18 NOTE — Progress Notes (Signed)
Patient Care Team: Binnie Rail, MD as PCP - General (Internal Medicine) Rigoberto Noel, MD (Pulmonary Disease) Marylynn Pearson, MD (Obstetrics and Gynecology) Juanita Craver, MD (Gastroenterology) Erroll Luna, MD as Consulting Physician (General Surgery) Nicholas Lose, MD as Consulting Physician (Hematology and Oncology) Eppie Gibson, MD as Attending Physician (Radiation Oncology) Sylvan Cheese, NP as Nurse Practitioner (Hematology and Oncology)  DIAGNOSIS:  Encounter Diagnoses  Name Primary?  . Malignant neoplasm of upper-inner quadrant of right breast in female, estrogen receptor positive ([place])   . 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, grade 1, left, LCIS, 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  She is tolerating anastrozole extremely well.  She denies any hot  flashes or myalgias.  She is tolerating this much better than tamoxifen.  REVIEW OF SYSTEMS:   Constitutional: Denies fevers, chills or abnormal weight loss Eyes: Denies blurriness of vision Ears, nose, mouth, throat, and face: Denies mucositis or sore throat Respiratory: Denies cough, dyspnea or wheezes Cardiovascular: Denies palpitation, chest discomfort Gastrointestinal:  Denies nausea, heartburn or change in bowel habits Skin: Denies abnormal skin rashes Lymphatics: Denies new lymphadenopathy or easy bruising Neurological:Denies numbness, tingling or new weaknesses Behavioral/Psych: Mood is stable, no new changes  Extremities: No lower extremity edema Breast:  denies any pain or lumps or nodules in either breasts All other systems were reviewed with the patient and are negative.  I have reviewed the past medical history, past surgical history, social history and family history with the patient and they are unchanged from previous note.  ALLERGIES:  has No Known Allergies.  MEDICATIONS:  Current Outpatient Medications  Medication Sig Dispense Refill  . anastrozole (ARIMIDEX) 1 MG tablet Take 1 tablet (1 mg total) by mouth daily. 90 tablet 3  . aspirin 81 MG tablet Take 81 mg by mouth daily.    . citalopram (CELEXA) 20 MG tablet Take 1 tablet (20 mg total) by mouth daily. 10 tablet 0  . cycloSPORINE (RESTASIS) 0.05 % ophthalmic emulsion Place 1 drop into both eyes 2 (two) times daily. 0.4 mL   . eszopiclone (LUNESTA) 2 MG TABS tablet Take 1 tablet (2 mg total) by mouth at bedtime as needed for sleep. Take immediately before bedtime 90 tablet 0  . levothyroxine (  SYNTHROID, LEVOTHROID) 75 MCG tablet TAKE 1 TABLET BY MOUTH  DAILY BEFORE BREAKFAST 90 tablet 1  . Multiple Vitamin (MULTIVITAMIN) capsule Take 1 capsule by mouth daily.    Marland Kitchen tretinoin (RETIN-A) 0.025 % cream Apply topically at bedtime. 45 g 0   Current Facility-Administered Medications  Medication Dose Route Frequency  Provider Last Rate Last Dose  . Influenza vac split quadrivalent PF (FLUARIX) injection 0.5 mL  0.5 mL Intramuscular Once Nicholas Lose, MD        PHYSICAL EXAMINATION: ECOG PERFORMANCE STATUS: 1 - Symptomatic but completely ambulatory  Vitals:   08/18/18 1330  BP: 122/70  Pulse: 76  Resp: 20  Temp: 98.3 F (36.8 C)  SpO2: 99%   Filed Weights   08/18/18 1330  Weight: 131 lb 4.8 oz (59.6 kg)    GENERAL:alert, no distress and comfortable SKIN: skin color, texture, turgor are normal, no rashes or significant lesions EYES: normal, Conjunctiva are pink and non-injected, sclera clear OROPHARYNX:no exudate, no erythema and lips, buccal mucosa, and tongue normal  NECK: supple, thyroid normal size, non-tender, without nodularity LYMPH:  no palpable lymphadenopathy in the cervical, axillary or inguinal LUNGS: clear to auscultation and percussion with normal breathing effort HEART: regular rate & rhythm and no murmurs and no lower extremity edema ABDOMEN:abdomen soft, non-tender and normal bowel sounds MUSCULOSKELETAL:no cyanosis of digits and no clubbing  NEURO: alert & oriented x 3 with fluent speech, no focal motor/sensory deficits EXTREMITIES: No lower extremity edema BREAST: No palpable masses or nodules in either right or left breasts. No palpable axillary supraclavicular or infraclavicular adenopathy no breast tenderness or nipple discharge. (exam performed in the presence of a chaperone)  LABORATORY DATA:  I have reviewed the data as listed CMP Latest Ref Rng & Units 08/18/2018 11/07/2017 11/06/2016  Glucose 70 - 99 mg/dL 183(H) 95 102(H)  BUN 6 - 20 mg/dL _0 Creatinine 0.44 - 1.00 mg/dL 0.80 0.74 0.76  Sodium 135 - 145 mmol/L 137 140 140  Potassium 3.5 - 5.1 mmol/L 4.1 4.2 4.5  Chloride 98 - 111 mmol/L 100 103 104  CO2 22 - 32 mmol/L _1 Calcium 8.9 - 10.3 mg/dL 9.9 9.7 9.5  Total Protein 6.5 - 8.1 g/dL 7.2 7.0 6.9  Total Bilirubin 0.3 - 1.2 mg/dL 0.3 0.5 0.4    Alkaline Phos 38 - 126 U/L 57 38(L) 27(L)  AST 15 - 41 U/L _2 ALT 0 - 44 U/L _3 Lab Results  Component Value Date   WBC 4.4 08/18/2018   HGB 12.0 08/18/2018   HCT 36.2 08/18/2018   MCV 92.8 08/18/2018   PLT 247 08/18/2018   NEUTROABS 2.3 08/18/2018    ASSESSMENT & PLAN:  Breast cancer of upper-inner quadrant of right female breast ([place]) Right lumpectomy 11/16/2015: 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  Breast Cancer Surveillance: 1. Breast exam  08/18/2018: Benign 2. Mammogram 11/9/2018n abnormalities. Postsurgical changes. Breast Density Category C.  3. Bone density 07/29/17: T score -1.7  Emotional issues: She has done much better when she switched her to evening. Patient  is in the process of cutting down and discontinuing Celexa. Recently she got married in July 2019.  Return to clinic in one year.   No orders of the defined types were placed in this encounter.  The patient has a good understanding of the overall plan. she agrees with it. she will call with any problems that may develop before the next visit here.   Jeanne Ohara, MD 08/18/18

## 2018-08-27 ENCOUNTER — Other Ambulatory Visit: Payer: Self-pay | Admitting: Hematology and Oncology

## 2018-08-27 DIAGNOSIS — Z853 Personal history of malignant neoplasm of breast: Secondary | ICD-10-CM

## 2018-10-02 ENCOUNTER — Other Ambulatory Visit: Payer: Self-pay

## 2018-10-02 ENCOUNTER — Other Ambulatory Visit: Payer: Self-pay | Admitting: Hematology and Oncology

## 2018-10-02 DIAGNOSIS — Z853 Personal history of malignant neoplasm of breast: Secondary | ICD-10-CM

## 2018-10-04 ENCOUNTER — Other Ambulatory Visit: Payer: Self-pay | Admitting: Internal Medicine

## 2018-10-05 ENCOUNTER — Other Ambulatory Visit: Payer: Self-pay | Admitting: Adult Health

## 2018-10-05 DIAGNOSIS — Z853 Personal history of malignant neoplasm of breast: Secondary | ICD-10-CM

## 2018-10-06 ENCOUNTER — Ambulatory Visit
Admission: RE | Admit: 2018-10-06 | Discharge: 2018-10-06 | Disposition: A | Discharge status: Home / Self Care | Payer: 59 | Source: Ambulatory Visit | Attending: Hematology and Oncology | Admitting: Hematology and Oncology

## 2018-10-06 DIAGNOSIS — Z853 Personal history of malignant neoplasm of breast: Secondary | ICD-10-CM

## 2018-10-06 DIAGNOSIS — R922 Inconclusive mammogram: Secondary | ICD-10-CM | POA: Diagnosis not present

## 2018-10-06 HISTORY — DX: Personal history of irradiation: Z92.3

## 2018-11-03 NOTE — Patient Instructions (Signed)
Tests ordered today. Your results will be released to [facility] (or called to you) after review, usually within 72hours after test completion. If any changes need to be made, you will be notified at that same time.  All other Health Maintenance issues reviewed.   All recommended immunizations and age-appropriate screenings are up-to-date or discussed.  No immunizations administered today.   Medications reviewed and updated.  Changes include :     Your prescription(s) have been submitted to your pharmacy. Please take as directed and contact our office if you believe you are having problem(s) with the medication(s).  A referral was ordered for   Please followup in    Health Maintenance, Female Adopting a healthy lifestyle and getting preventive care can go a long way to promote health and wellness. Talk with your health care provider about what schedule of regular examinations is right for you. This is a good chance for you to check in with your provider about disease prevention and staying healthy. In between checkups, there are plenty of things you can do on your own. Experts have done a lot of research about which lifestyle changes and preventive measures are most likely to keep you healthy. Ask your health care provider for more information. Weight and diet Eat a healthy diet  Be sure to include plenty of vegetables, fruits, low-fat dairy products, and lean protein.  Do not eat a lot of foods high in solid fats, added sugars, or salt.  Get regular exercise. This is one of the most important things you can do for your health. ? Most adults should exercise for at least 150 minutes each week. The exercise should increase your heart rate and make you sweat (moderate-intensity exercise). ? Most adults should also do strengthening exercises at least twice a week. This is in addition to the moderate-intensity exercise.  Maintain a healthy weight  Body mass index (BMI) is a measurement that  can be used to identify possible weight problems. It estimates body fat based on height and weight. Your health care provider can help determine your BMI and help you achieve or maintain a healthy weight.  For females 50 years of age and older: ? A BMI below 18.5 is considered underweight. ? A BMI of 18.5 to 24.9 is normal. ? A BMI of 25 to 29.9 is considered overweight. ? A BMI of 30 and above is considered obese.  Watch levels of cholesterol and blood lipids  You should start having your blood tested for lipids and cholesterol at 58 years of age, then have this test every 5 years.  You may need to have your cholesterol levels checked more often if: ? Your lipid or cholesterol levels are high. ? You are older than 58 years of age. ? You are at high risk for heart disease.  Cancer screening Lung Cancer  Lung cancer screening is recommended for adults 4-43 years old who are at high risk for lung cancer because of a history of smoking.  A yearly low-dose CT scan of the lungs is recommended for people who: ? Currently smoke. ? Have quit within the past 15 years. ? Have at least a 30-pack-year history of smoking. A pack year is smoking an average of one pack of cigarettes a day for 1 year.  Yearly screening should continue until it has been 15 years since you quit.  Yearly screening should stop if you develop a health problem that would prevent you from having lung cancer treatment.  Breast  Cancer  Practice breast self-awareness. This means understanding how your breasts normally appear and feel.  It also means doing regular breast self-exams. Let your health care provider know about any changes, no matter how small.  If you are in your 20s or 30s, you should have a clinical breast exam (CBE) by a health care provider every 1-3 years as part of a regular health exam.  If you are 40 or older, have a CBE every year. Also consider having a breast X-ray (mammogram) every year.  If  you have a family history of breast cancer, talk to your health care provider about genetic screening.  If you are at high risk for breast cancer, talk to your health care provider about having an MRI and a mammogram every year.  Breast cancer gene (BRCA) assessment is recommended for women who have family members with BRCA-related cancers. BRCA-related cancers include: ? Breast. ? Ovarian. ? Tubal. ? Peritoneal cancers.  Results of the assessment will determine the need for genetic counseling and BRCA1 and BRCA2 testing.  Cervical Cancer Your health care provider may recommend that you be screened regularly for cancer of the pelvic organs (ovaries, uterus, and vagina). This screening involves a pelvic examination, including checking for microscopic changes to the surface of your cervix (Pap test). You may be encouraged to have this screening done every 3 years, beginning at age 21.  For women ages 30-65, health care providers may recommend pelvic exams and Pap testing every 3 years, or they may recommend the Pap and pelvic exam, combined with testing for human papilloma virus (HPV), every 5 years. Some types of HPV increase your risk of cervical cancer. Testing for HPV may also be done on women of any age with unclear Pap test results.  Other health care providers may not recommend any screening for nonpregnant women who are considered low risk for pelvic cancer and who do not have symptoms. Ask your health care provider if a screening pelvic exam is right for you.  If you have had past treatment for cervical cancer or a condition that could lead to cancer, you need Pap tests and screening for cancer for at least 20 years after your treatment. If Pap tests have been discontinued, your risk factors (such as having a new sexual partner) need to be reassessed to determine if screening should resume. Some women have medical problems that increase the chance of getting cervical cancer. In these cases,  your health care provider may recommend more frequent screening and Pap tests.  Colorectal Cancer  This type of cancer can be detected and often prevented.  Routine colorectal cancer screening usually begins at 58 years of age and continues through 58 years of age.  Your health care provider may recommend screening at an earlier age if you have risk factors for colon cancer.  Your health care provider may also recommend using home test kits to check for hidden blood in the stool.  A small camera at the end of a tube can be used to examine your colon directly (sigmoidoscopy or colonoscopy). This is done to check for the earliest forms of colorectal cancer.  Routine screening usually begins at age 50.  Direct examination of the colon should be repeated every 5-10 years through 58 years of age. However, you may need to be screened more often if early forms of precancerous polyps or small growths are found.  Skin Cancer  Check your skin from head to toe regularly.  Tell your   health care provider about any new moles or changes in moles, especially if there is a change in a mole's shape or color.  Also tell your health care provider if you have a mole that is larger than the size of a pencil eraser.  Always use sunscreen. Apply sunscreen liberally and repeatedly throughout the day.  Protect yourself by wearing long sleeves, pants, a wide-brimmed hat, and sunglasses whenever you are outside.  Heart disease, diabetes, and high blood pressure  High blood pressure causes heart disease and increases the risk of stroke. High blood pressure is more likely to develop in: ? People who have blood pressure in the high end of the normal range (130-139/85-89 mm Hg). ? People who are overweight or obese. ? People who are African American.  If you are 18-43 years of age, have your blood pressure checked every 3-5 years. If you are 31 years of age or older, have your blood pressure checked every year.  You should have your blood pressure measured twice-once when you are at a hospital or clinic, and once when you are not at a hospital or clinic. Record the average of the two measurements. To check your blood pressure when you are not at a hospital or clinic, you can use: ? An automated blood pressure machine at a pharmacy. ? A home blood pressure monitor.  If you are between 72 years and 17 years old, ask your health care provider if you should take aspirin to prevent strokes.  Have regular diabetes screenings. This involves taking a blood sample to check your fasting blood sugar level. ? If you are at a normal weight and have a low risk for diabetes, have this test once every three years after 58 years of age. ? If you are overweight and have a high risk for diabetes, consider being tested at a younger age or more often. Preventing infection Hepatitis B  If you have a higher risk for hepatitis B, you should be screened for this virus. You are considered at high risk for hepatitis B if: ? You were born in a country where hepatitis B is common. Ask your health care provider which countries are considered high risk. ? Your parents were born in a high-risk country, and you have not been immunized against hepatitis B (hepatitis B vaccine). ? You have HIV or AIDS. ? You use needles to inject street drugs. ? You live with someone who has hepatitis B. ? You have had sex with someone who has hepatitis B. ? You get hemodialysis treatment. ? You take certain medicines for conditions, including cancer, organ transplantation, and autoimmune conditions.  Hepatitis C  Blood testing is recommended for: ? Everyone born from [birth year] through 1966. ? Anyone with known risk factors for hepatitis C.  Sexually transmitted infections (STIs)  You should be screened for sexually transmitted infections (STIs) including gonorrhea and chlamydia if: ? You are sexually active and are younger than 58 years of  age. ? You are older than 58 years of age and your health care provider tells you that you are at risk for this type of infection. ? Your sexual activity has changed since you were last screened and you are at an increased risk for chlamydia or gonorrhea. Ask your health care provider if you are at risk.  If you do not have HIV, but are at risk, it may be recommended that you take a prescription medicine daily to prevent HIV infection. This is called pre-exposure prophylaxis (  PrEP). You are considered at risk if: ? You are sexually active and do not regularly use condoms or know the HIV status of your partner(s). ? You take drugs by injection. ? You are sexually active with a partner who has HIV.  Talk with your health care provider about whether you are at high risk of being infected with HIV. If you choose to begin PrEP, you should first be tested for HIV. You should then be tested every 3 months for as long as you are taking PrEP. Pregnancy  If you are premenopausal and you may become pregnant, ask your health care provider about preconception counseling.  If you may become pregnant, take 400 to 800 micrograms (mcg) of folic acid every day.  If you want to prevent pregnancy, talk to your health care provider about birth control (contraception). Osteoporosis and menopause  Osteoporosis is a disease in which the bones lose minerals and strength with aging. This can result in serious bone fractures. Your risk for osteoporosis can be identified using a bone density scan.  If you are 103 years of age or older, or if you are at risk for osteoporosis and fractures, ask your health care provider if you should be screened.  Ask your health care provider whether you should take a calcium or vitamin D supplement to lower your risk for osteoporosis.  Menopause may have certain physical symptoms and risks.  Hormone replacement therapy may reduce some of these symptoms and risks. Talk to your health  care provider about whether hormone replacement therapy is right for you. Follow these instructions at home:  Schedule regular health, dental, and eye exams.  Stay current with your immunizations.  Do not use any tobacco products including cigarettes, chewing tobacco, or electronic cigarettes.  If you are pregnant, do not drink alcohol.  If you are breastfeeding, limit how much and how often you drink alcohol.  Limit alcohol intake to no more than 1 drink per day for nonpregnant women. One drink equals 12 ounces of beer, 5 ounces of wine, or 1 ounces of hard liquor.  Do not use street drugs.  Do not share needles.  Ask your health care provider for help if you need support or information about quitting drugs.  Tell your health care provider if you often feel depressed.  Tell your health care provider if you have ever been abused or do not feel safe at home. This information is not intended to replace advice given to you by your health care provider. Make sure you discuss any questions you have with your health care provider. Document Released: 05/27/2011 Document Revised: 04/18/2016 Document Reviewed: 08/15/2015 Elsevier Interactive Patient Education  [employer].

## 2018-11-03 NOTE — Progress Notes (Signed)
Subjective:    Patient ID: Jeanne Parker, female    DOB: [birth year], 57 y.o.   MRN: [id number]  HPI      Medications and allergies reviewed with patient and updated if appropriate.  Patient Active Problem List   Diagnosis Date Noted  . Viral illness 07/17/2018  . Prediabetes 11/08/2017  . Hyperlipidemia 11/08/2017  . Vitamin D deficiency 11/06/2016  . Breast cancer of upper-inner quadrant of right female breast ([place]) 10/16/2015  . Generalized anxiety disorder 01/04/2013  . Hypothyroidism   . Insomnia 01/20/2012  . Snoring 01/20/2012    Current Outpatient Medications on File Prior to Visit  Medication Sig Dispense Refill  . anastrozole (ARIMIDEX) 1 MG tablet Take 1 tablet (1 mg total) by mouth daily. 90 tablet 3  . aspirin 81 MG tablet Take 81 mg by mouth daily.    . citalopram (CELEXA) 20 MG tablet Take 1 tablet (20 mg total) by mouth daily. Annual appt due in Dec must see provider for future refills 90 tablet 0  . cycloSPORINE (RESTASIS) 0.05 % ophthalmic emulsion Place 1 drop into both eyes 2 (two) times daily. 0.4 mL   . eszopiclone (LUNESTA) 2 MG TABS tablet Take 1 tablet (2 mg total) by mouth at bedtime as needed for sleep. Take immediately before bedtime 90 tablet 0  . levothyroxine (SYNTHROID, LEVOTHROID) 75 MCG tablet TAKE 1 TABLET BY MOUTH  DAILY BEFORE BREAKFAST 90 tablet 1  . Multiple Vitamin (MULTIVITAMIN) capsule Take 1 capsule by mouth daily.    Marland Kitchen tretinoin (RETIN-A) 0.025 % cream Apply topically at bedtime. 45 g 0   No current facility-administered medications on file prior to visit.     Past Medical History:  Diagnosis Date  . Anxiety    situational  . 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 History of chicken pox   . History of colon polyps   . Hypothyroid   . Personal history of radiation therapy     Past Surgical History:  Procedure Laterality Date  . BREAST BIOPSY    . BREAST EXCISIONAL BIOPSY Left 1998    . BREAST LUMPECTOMY Right 2016  . BREAST SURGERY  1998   fibroid adenoma   . RADIOACTIVE SEED GUIDED PARTIAL MASTECTOMY WITH AXILLARY SENTINEL LYMPH NODE BIOPSY Right 11/16/2015   Procedure: RADIOACTIVE SEED GUIDED PARTIAL MASTECTOMY WITH AXILLARY SENTINEL LYMPH NODE BIOPSY;  Surgeon: Erroll Luna, MD;  Location: [facility];  Service: General;  Laterality: Right;    Social History   Socioeconomic History  . Marital status: Married    Spouse name: Not on file  . Number of children: 3  . Years of education: Not on file  . Highest education level: Not on file  Occupational History  . Occupation: Engineer, maintenance    Comment: Pharmacist, community  Social Needs  . Financial resource strain: Not on file  . Food insecurity:    Worry: Not on file    Inability: Not on file  . Transportation needs:    Medical: Not on file    Non-medical: Not on file  Tobacco Use  . Smoking status: Never Smoker  . Smokeless tobacco: Never Used  Substance and Sexual Activity  . Alcohol use: Yes    Comment: 1 to 2 glasses of wine a week   . Drug use: No  . Sexual activity: Not on file  Lifestyle  . Physical activity:  Days per week: Not on file    Minutes per session: Not on file  . Stress: Not on file  Relationships  . Social connections:    Talks on phone: Not on file    Gets together: Not on file    Attends religious service: Not on file    Active member of club or organization: Not on file    Attends meetings of clubs or organizations: Not on file    Relationship status: Not on file  Other Topics Concern  . Not on file  Social History Narrative  . Not on file    Family History  Problem Relation Age of Onset  . Colon cancer Paternal Grandfather        died age 34  . Hypertension Mother 29  . Hyperlipidemia Mother   . Thyroid nodules Mother   . Diabetes Father 30  . Hyperlipidemia Father   . Hypertension Father   . GER disease Father   . Coronary artery disease  Father        angioplasty age 75s  . Atrial fibrillation Father        on coumadin  . Gout Brother   . Thyroid cancer Sister     Review of Systems     Objective:  There were no vitals filed for this visit. There were no vitals filed for this visit. There is no height or weight on file to calculate BMI.  BP Readings from Last 3 Encounters:  08/18/18 122/70  07/17/18 120/72  11/07/17 122/84    Wt Readings from Last 3 Encounters:  08/18/18 131 lb 4.8 oz (59.6 kg)  07/17/18 134 lb 12.8 oz (61.1 kg)  11/07/17 142 lb (64.4 kg)     Physical Exam            This encounter was created in error - please disregard.

## 2018-11-04 ENCOUNTER — Encounter: Payer: 59 | Admitting: Internal Medicine

## 2018-11-30 NOTE — Progress Notes (Signed)
Subjective:    Patient ID: Jeanne Parker, female    DOB: Sep 15, 1961, 58 y.o.   MRN: [id number]  HPI She is here for a physical exam.   She got married last year.   She denies any major changes in her health since she was here last  She slept funny on her jaw 6 months ago.  She did have and still has jaw pain since then.    It is getting better.  She did discuss with her dentist.    She is concerned about her risk of heart disease.  She would like to have a cardiac calcium test and is willing to pay for it.    Medications and allergies reviewed with patient and updated if appropriate.  Patient Active Problem List   Diagnosis Date Noted  . Jaw pain 12/01/2018  . Prediabetes 11/08/2017  . Hyperlipidemia 11/08/2017  . Vitamin D deficiency 11/06/2016  . Breast cancer of upper-inner quadrant of right female breast ([place]) 10/16/2015  . Generalized anxiety disorder 01/04/2013  . Hypothyroidism   . Insomnia 01/20/2012  . Snoring 01/20/2012    Current Outpatient Medications on File Prior to Visit  Medication Sig Dispense Refill  . anastrozole (ARIMIDEX) 1 MG tablet Take 1 tablet (1 mg total) by mouth daily. 90 tablet 3  . citalopram (CELEXA) 20 MG tablet Take 1 tablet (20 mg total) by mouth daily. Annual appt due in Dec must see provider for future refills 90 tablet 0  . eszopiclone (LUNESTA) 2 MG TABS tablet Take 1 tablet (2 mg total) by mouth at bedtime as needed for sleep. Take immediately before bedtime 90 tablet 0  . levothyroxine (SYNTHROID, LEVOTHROID) 75 MCG tablet TAKE 1 TABLET BY MOUTH  DAILY BEFORE BREAKFAST 90 tablet 1  . Multiple Vitamin (MULTIVITAMIN) capsule Take 1 capsule by mouth daily.    Marland Kitchen tretinoin (RETIN-A) 0.025 % cream Apply topically at bedtime. 45 g 0   No current facility-administered medications on file prior to visit.     Past Medical History:  Diagnosis Date  . Anxiety    situational  . 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 History of chicken pox   . History of colon polyps   . Hypothyroid   . Personal history of radiation therapy     Past Surgical History:  Procedure Laterality Date  . BREAST BIOPSY    . BREAST EXCISIONAL BIOPSY Left 1998  . BREAST LUMPECTOMY Right 2016  . BREAST SURGERY  1998   fibroid adenoma   . RADIOACTIVE SEED GUIDED PARTIAL MASTECTOMY WITH AXILLARY SENTINEL LYMPH NODE BIOPSY Right 11/16/2015   Procedure: RADIOACTIVE SEED GUIDED PARTIAL MASTECTOMY WITH AXILLARY SENTINEL LYMPH NODE BIOPSY;  Surgeon: Erroll Luna, MD;  Location: [facility];  Service: General;  Laterality: Right;    Social History   Socioeconomic History  . Marital status: Married    Spouse name: Not on file  . Number of children: 3  . Years of education: Not on file  . Highest education level: Not on file  Occupational History  . Occupation: Engineer, maintenance    Comment: Pharmacist, community  Social Needs  . Financial resource strain: Not on file  . Food insecurity:    Worry: Not on file    Inability: Not on file  . Transportation needs:    Medical: Not on file    Non-medical: Not on file  Tobacco Use  .  Smoking status: Never Smoker  . Smokeless tobacco: Never Used  Substance and Sexual Activity  . Alcohol use: Yes    Comment: 1 to 2 glasses of wine a week   . Drug use: No  . Sexual activity: Not on file  Lifestyle  . Physical activity:    Days per week: Not on file    Minutes per session: Not on file  . Stress: Not on file  Relationships  . Social connections:    Talks on phone: Not on file    Gets together: Not on file    Attends religious service: Not on file    Active member of club or organization: Not on file    Attends meetings of clubs or organizations: Not on file    Relationship status: Not on file  Other Topics Concern  . Not on file  Social History Narrative  . Not on file    Family History  Problem Relation Age of Onset  . Colon cancer  Paternal Grandfather        died age 35  . Hypertension Mother 77  . Hyperlipidemia Mother   . Thyroid nodules Mother   . Diabetes Father 27  . Hyperlipidemia Father   . Hypertension Father   . GER disease Father   . Coronary artery disease Father        angioplasty age 22s  . Atrial fibrillation Father        on coumadin  . Gout Brother   . Thyroid cancer Sister     Review of Systems  Constitutional: Negative for chills and fever.  Eyes: Negative for visual disturbance.  Respiratory: Negative for cough, shortness of breath and wheezing.   Cardiovascular: Negative for chest pain, palpitations and leg swelling.  Gastrointestinal: Negative for abdominal pain, blood in stool, constipation, diarrhea and nausea.       No gerd  Genitourinary: Negative for dysuria and hematuria.  Musculoskeletal: Negative for arthralgias and back pain.  Skin: Negative for color change and rash.  Neurological: Negative for dizziness, light-headedness and headaches.  Psychiatric/Behavioral: Negative for dysphoric mood. The patient is nervous/anxious (controlled).        Objective:   Vitals:   12/01/18 1327  BP: 130/82  Pulse: 68  Resp: 16  Temp: 98.5 F (36.9 C)  SpO2: 98%   Filed Weights   12/01/18 1327  Weight: 133 lb 6.4 oz (60.5 kg)   Body mass index is 22.2 kg/m.  BP Readings from Last 3 Encounters:  12/01/18 130/82  08/18/18 122/70  07/17/18 120/72    Wt Readings from Last 3 Encounters:  12/01/18 133 lb 6.4 oz (60.5 kg)  08/18/18 131 lb 4.8 oz (59.6 kg)  07/17/18 134 lb 12.8 oz (61.1 kg)     Physical Exam Constitutional: She appears well-developed and well-nourished. No distress.  HENT:  Head: Normocephalic and atraumatic.  Right Ear: External ear normal. Normal ear canal and TM Left Ear: External ear normal.  Normal ear canal and TM Mouth/Throat: Oropharynx is clear and moist.  Eyes: Conjunctivae and EOM are normal.  Neck: Neck supple. No tracheal deviation present.  No thyromegaly present.  No carotid bruit  Cardiovascular: Normal rate, regular rhythm and normal heart sounds.   No murmur heard.  No edema. Pulmonary/Chest: Effort normal and breath sounds normal. No respiratory distress. She has no wheezes. She has no rales.  Breast: deferred to Gyn Abdominal: Soft. She exhibits no distension. There is no tenderness.  Lymphadenopathy: She has no  cervical adenopathy.  Skin: Skin is warm and dry. She is not diaphoretic.  Psychiatric: She has a normal mood and affect. Her behavior is normal.        Assessment & Plan:   Physical exam: Screening blood work  ordered Immunizations  Up to date  Colonoscopy   Up to date  Mammogram   Up to date  39  - [facility] for women - Up to date  Eye exams   Up to date  EKG  None on file Exercise   Regularly exercising - running, cycles, weights Weight   Good BMI Skin   No concerns Substance abuse  none  See Problem List for Assessment and Plan of chronic medical problems.  Fu  in one year

## 2018-12-01 ENCOUNTER — Ambulatory Visit (INDEPENDENT_AMBULATORY_CARE_PROVIDER_SITE_OTHER): Payer: 59 | Admitting: Internal Medicine

## 2018-12-01 ENCOUNTER — Encounter: Payer: Self-pay | Admitting: Internal Medicine

## 2018-12-01 VITALS — BP 130/82 | HR 68 | Temp 98.5°F | Resp 16 | Ht 65.0 in | Wt 133.4 lb

## 2018-12-01 DIAGNOSIS — Z0000 Encounter for general adult medical examination without abnormal findings: Secondary | ICD-10-CM | POA: Diagnosis not present

## 2018-12-01 DIAGNOSIS — E039 Hypothyroidism, unspecified: Secondary | ICD-10-CM

## 2018-12-01 DIAGNOSIS — R7303 Prediabetes: Secondary | ICD-10-CM

## 2018-12-01 DIAGNOSIS — R6884 Jaw pain: Secondary | ICD-10-CM

## 2018-12-01 DIAGNOSIS — E559 Vitamin D deficiency, unspecified: Secondary | ICD-10-CM

## 2018-12-01 DIAGNOSIS — G4700 Insomnia, unspecified: Secondary | ICD-10-CM | POA: Diagnosis not present

## 2018-12-01 DIAGNOSIS — F411 Generalized anxiety disorder: Secondary | ICD-10-CM

## 2018-12-01 DIAGNOSIS — E782 Mixed hyperlipidemia: Secondary | ICD-10-CM

## 2018-12-01 MED ORDER — ESZOPICLONE 2 MG PO TABS: 2.0000 mg | ORAL_TABLET | Freq: Every evening | ORAL | 0 refills | Status: DC | PRN

## 2018-12-01 NOTE — Assessment & Plan Note (Signed)
Check lipid panel  Not on medication - likely genetic Regular exercise and healthy diet encouraged - has lost weight  Concerned about increased risk of heart disease with fam hx, high chol and prediabetes -- would like calcium ct scan - willing to pay - ordered

## 2018-12-01 NOTE — Assessment & Plan Note (Signed)
Clinically euthyroid Check tsh  Titrate med dose if needed  

## 2018-12-01 NOTE — Assessment & Plan Note (Addendum)
Taking a MVI intermittently Check vitamin d level

## 2018-12-01 NOTE — Assessment & Plan Note (Signed)
Check a1c Low sugar / carb diet Stressed regular exercise   

## 2018-12-01 NOTE — Assessment & Plan Note (Signed)
Started 6 months ago after sleeping awkwardly on jaw Pain improving  Discussed with dentist Take advil prn but not long term Avoid excessive jaw use F/u wth dentist if no improvement

## 2018-12-01 NOTE — Assessment & Plan Note (Signed)
Controlled, stable Continue current dose of medication  

## 2018-12-01 NOTE — Patient Instructions (Addendum)
Tests ordered today. Your results will be released to [place] (or called to you) after review, usually within 72hours after test completion. If any changes need to be made, you will be notified at that same time.  All other Health Maintenance issues reviewed.   All recommended immunizations and age-appropriate screenings are up-to-date or discussed.  No immunizations administered today.   Medications reviewed and updated.  Changes include :   none   A ct scan for cardiac score.  Someone will call you to schedule this.   Please followup in one year   Health Maintenance, Female Adopting a healthy lifestyle and getting preventive care can go a long way to promote health and wellness. Talk with your health care provider about what schedule of regular examinations is right for you. This is a good chance for you to check in with your provider about disease prevention and staying healthy. In between checkups, there are plenty of things you can do on your own. Experts have done a lot of research about which lifestyle changes and preventive measures are most likely to keep you healthy. Ask your health care provider for more information. Weight and diet Eat a healthy diet  Be sure to include plenty of vegetables, fruits, low-fat dairy products, and lean protein.  Do not eat a lot of foods high in solid fats, added sugars, or salt.  Get regular exercise. This is one of the most important things you can do for your health. ? Most adults should exercise for at least 150 minutes each week. The exercise should increase your heart rate and make you sweat (moderate-intensity exercise). ? Most adults should also do strengthening exercises at least twice a week. This is in addition to the moderate-intensity exercise. Maintain a healthy weight  Body mass index (BMI) is a measurement that can be used to identify possible weight problems. It estimates body fat based on height and weight. Your health care  provider can help determine your BMI and help you achieve or maintain a healthy weight.  For females 20 years of age and older: ? A BMI below 18.5 is considered underweight. ? A BMI of 18.5 to 24.9 is normal. ? A BMI of 25 to 29.9 is considered overweight. ? A BMI of 30 and above is considered obese. Watch levels of cholesterol and blood lipids  You should start having your blood tested for lipids and cholesterol at 59 years of age, then have this test every 5 years.  You may need to have your cholesterol levels checked more often if: ? Your lipid or cholesterol levels are high. ? You are older than 59 years of age. ? You are at high risk for heart disease. Cancer screening Lung Cancer  Lung cancer screening is recommended for adults 18-65 years old who are at high risk for lung cancer because of a history of smoking.  A yearly low-dose CT scan of the lungs is recommended for people who: ? Currently smoke. ? Have quit within the past 15 years. ? Have at least a 30-pack-year history of smoking. A pack year is smoking an average of one pack of cigarettes a day for 1 year.  Yearly screening should continue until it has been 15 years since you quit.  Yearly screening should stop if you develop a health problem that would prevent you from having lung cancer treatment. Breast Cancer  Practice breast self-awareness. This means understanding how your breasts normally appear and feel.  It also means doing  regular breast self-exams. Let your health care provider know about any changes, no matter how small.  If you are in your 20s or 30s, you should have a clinical breast exam (CBE) by a health care provider every 1-3 years as part of a regular health exam.  If you are 14 or older, have a CBE every year. Also consider having a breast X-ray (mammogram) every year.  If you have a family history of breast cancer, talk to your health care provider about genetic screening.  If you are at high  risk for breast cancer, talk to your health care provider about having an MRI and a mammogram every year.  Breast cancer gene (BRCA) assessment is recommended for women who have family members with BRCA-related cancers. BRCA-related cancers include: ? Breast. ? Ovarian. ? Tubal. ? Peritoneal cancers.  Results of the assessment will determine the need for genetic counseling and BRCA1 and BRCA2 testing. Cervical Cancer Your health care provider may recommend that you be screened regularly for cancer of the pelvic organs (ovaries, uterus, and vagina). This screening involves a pelvic examination, including checking for microscopic changes to the surface of your cervix (Pap test). You may be encouraged to have this screening done every 3 years, beginning at age 53.  For women ages 8-65, health care providers may recommend pelvic exams and Pap testing every 3 years, or they may recommend the Pap and pelvic exam, combined with testing for human papilloma virus (HPV), every 5 years. Some types of HPV increase your risk of cervical cancer. Testing for HPV may also be done on women of any age with unclear Pap test results.  Other health care providers may not recommend any screening for nonpregnant women who are considered low risk for pelvic cancer and who do not have symptoms. Ask your health care provider if a screening pelvic exam is right for you.  If you have had past treatment for cervical cancer or a condition that could lead to cancer, you need Pap tests and screening for cancer for at least 20 years after your treatment. If Pap tests have been discontinued, your risk factors (such as having a new sexual partner) need to be reassessed to determine if screening should resume. Some women have medical problems that increase the chance of getting cervical cancer. In these cases, your health care provider may recommend more frequent screening and Pap tests. Colorectal Cancer  This type of cancer can  be detected and often prevented.  Routine colorectal cancer screening usually begins at 59 years of age and continues through 59 years of age.  Your health care provider may recommend screening at an earlier age if you have risk factors for colon cancer.  Your health care provider may also recommend using home test kits to check for hidden blood in the stool.  A small camera at the end of a tube can be used to examine your colon directly (sigmoidoscopy or colonoscopy). This is done to check for the earliest forms of colorectal cancer.  Routine screening usually begins at age 39.  Direct examination of the colon should be repeated every 5-10 years through 59 years of age. However, you may need to be screened more often if early forms of precancerous polyps or small growths are found. Skin Cancer  Check your skin from head to toe regularly.  Tell your health care provider about any new moles or changes in moles, especially if there is a change in a mole's shape or color.  Also tell your health care provider if you have a mole that is larger than the size of a pencil eraser.  Always use sunscreen. Apply sunscreen liberally and repeatedly throughout the day.  Protect yourself by wearing long sleeves, pants, a wide-brimmed hat, and sunglasses whenever you are outside. Heart disease, diabetes, and high blood pressure  High blood pressure causes heart disease and increases the risk of stroke. High blood pressure is more likely to develop in: ? People who have blood pressure in the high end of the normal range (130-139/85-89 mm Hg). ? People who are overweight or obese. ? People who are African American.  If you are 54-34 years of age, have your blood pressure checked every 3-5 years. If you are 69 years of age or older, have your blood pressure checked every year. You should have your blood pressure measured twice-once when you are at a hospital or clinic, and once when you are not at a  hospital or clinic. Record the average of the two measurements. To check your blood pressure when you are not at a hospital or clinic, you can use: ? An automated blood pressure machine at a pharmacy. ? A home blood pressure monitor.  If you are between 22 years and 7 years old, ask your health care provider if you should take aspirin to prevent strokes.  Have regular diabetes screenings. This involves taking a blood sample to check your fasting blood sugar level. ? If you are at a normal weight and have a low risk for diabetes, have this test once every three years after 59 years of age. ? If you are overweight and have a high risk for diabetes, consider being tested at a younger age or more often. Preventing infection Hepatitis B  If you have a higher risk for hepatitis B, you should be screened for this virus. You are considered at high risk for hepatitis B if: ? You were born in a country where hepatitis B is common. Ask your health care provider which countries are considered high risk. ? Your parents were born in a high-risk country, and you have not been immunized against hepatitis B (hepatitis B vaccine). ? You have HIV or AIDS. ? You use needles to inject street drugs. ? You live with someone who has hepatitis B. ? You have had sex with someone who has hepatitis B. ? You get hemodialysis treatment. ? You take certain medicines for conditions, including cancer, organ transplantation, and autoimmune conditions. Hepatitis C  Blood testing is recommended for: ? Everyone born from [birth year] through 1966. ? Anyone with known risk factors for hepatitis C. Sexually transmitted infections (STIs)  You should be screened for sexually transmitted infections (STIs) including gonorrhea and chlamydia if: ? You are sexually active and are younger than 59 years of age. ? You are older than 59 years of age and your health care provider tells you that you are at risk for this type of  infection. ? Your sexual activity has changed since you were last screened and you are at an increased risk for chlamydia or gonorrhea. Ask your health care provider if you are at risk.  If you do not have HIV, but are at risk, it may be recommended that you take a prescription medicine daily to prevent HIV infection. This is called pre-exposure prophylaxis (PrEP). You are considered at risk if: ? You are sexually active and do not regularly use condoms or know the HIV status of your partner(s). ?  You take drugs by injection. ? You are sexually active with a partner who has HIV. Talk with your health care provider about whether you are at high risk of being infected with HIV. If you choose to begin PrEP, you should first be tested for HIV. You should then be tested every 3 months for as long as you are taking PrEP. Pregnancy  If you are premenopausal and you may become pregnant, ask your health care provider about preconception counseling.  If you may become pregnant, take 400 to 800 micrograms (mcg) of folic acid every day.  If you want to prevent pregnancy, talk to your health care provider about birth control (contraception). Osteoporosis and menopause  Osteoporosis is a disease in which the bones lose minerals and strength with aging. This can result in serious bone fractures. Your risk for osteoporosis can be identified using a bone density scan.  If you are 69 years of age or older, or if you are at risk for osteoporosis and fractures, ask your health care provider if you should be screened.  Ask your health care provider whether you should take a calcium or vitamin D supplement to lower your risk for osteoporosis.  Menopause may have certain physical symptoms and risks.  Hormone replacement therapy may reduce some of these symptoms and risks. Talk to your health care provider about whether hormone replacement therapy is right for you. Follow these instructions at home:  Schedule  regular health, dental, and eye exams.  Stay current with your immunizations.  Do not use any tobacco products including cigarettes, chewing tobacco, or electronic cigarettes.  If you are pregnant, do not drink alcohol.  If you are breastfeeding, limit how much and how often you drink alcohol.  Limit alcohol intake to no more than 1 drink per day for nonpregnant women. One drink equals 12 ounces of beer, 5 ounces of wine, or 1 ounces of hard liquor.  Do not use street drugs.  Do not share needles.  Ask your health care provider for help if you need support or information about quitting drugs.  Tell your health care provider if you often feel depressed.  Tell your health care provider if you have ever been abused or do not feel safe at home. This information is not intended to replace advice given to you by your health care provider. Make sure you discuss any questions you have with your health care provider. Document Released: 05/27/2011 Document Revised: 04/18/2016 Document Reviewed: 08/15/2015 Elsevier Interactive Patient Education  2019 [employer].

## 2018-12-01 NOTE — Assessment & Plan Note (Signed)
Takes lunesta as needed Will refill Avoid taking it more than needed Discussed possible side effects

## 2018-12-02 ENCOUNTER — Other Ambulatory Visit (INDEPENDENT_AMBULATORY_CARE_PROVIDER_SITE_OTHER): Payer: 59

## 2018-12-02 DIAGNOSIS — R7303 Prediabetes: Secondary | ICD-10-CM

## 2018-12-02 DIAGNOSIS — E039 Hypothyroidism, unspecified: Secondary | ICD-10-CM

## 2018-12-02 DIAGNOSIS — Z0000 Encounter for general adult medical examination without abnormal findings: Secondary | ICD-10-CM

## 2018-12-02 DIAGNOSIS — E559 Vitamin D deficiency, unspecified: Secondary | ICD-10-CM | POA: Diagnosis not present

## 2018-12-02 DIAGNOSIS — E782 Mixed hyperlipidemia: Secondary | ICD-10-CM

## 2018-12-02 LAB — CBC WITH DIFFERENTIAL/PLATELET
Basophils Absolute: 0 10*3/uL (ref 0.0–0.1)
Basophils Relative: 0.7 % (ref 0.0–3.0)
Eosinophils Absolute: 0.2 10*3/uL (ref 0.0–0.7)
Eosinophils Relative: 5.6 % — ABNORMAL HIGH (ref 0.0–5.0)
HCT: 40.2 % (ref 36.0–46.0)
Hemoglobin: 13.3 g/dL (ref 12.0–15.0)
Lymphocytes Relative: 35.4 % (ref 12.0–46.0)
Lymphs Abs: 1.2 10*3/uL (ref 0.7–4.0)
MCHC: 33.1 g/dL (ref 30.0–36.0)
MCV: 92.7 fl (ref 78.0–100.0)
Monocytes Absolute: 0.4 10*3/uL (ref 0.1–1.0)
Monocytes Relative: 12.6 % — ABNORMAL HIGH (ref 3.0–12.0)
Neutro Abs: 1.5 10*3/uL (ref 1.4–7.7)
Neutrophils Relative %: 45.7 % (ref 43.0–77.0)
Platelets: 287 10*3/uL (ref 150.0–400.0)
RBC: 4.33 Mil/uL (ref 3.87–5.11)
RDW: 13.8 % (ref 11.5–15.5)
WBC: 3.3 10*3/uL — ABNORMAL LOW (ref 4.0–10.5)

## 2018-12-02 LAB — VITAMIN D 25 HYDROXY (VIT D DEFICIENCY, FRACTURES): VITD: 33.86 ng/mL (ref 30.00–100.00)

## 2018-12-02 LAB — COMPREHENSIVE METABOLIC PANEL
ALT: 16 U/L (ref 0–35)
AST: 18 U/L (ref 0–37)
Albumin: 4.5 g/dL (ref 3.5–5.2)
Alkaline Phosphatase: 39 U/L (ref 39–117)
BUN: 17 mg/dL (ref 6–23)
CO2: 28 mEq/L (ref 19–32)
Calcium: 9.9 mg/dL (ref 8.4–10.5)
Chloride: 102 mEq/L (ref 96–112)
Creatinine, Ser: 0.72 mg/dL (ref 0.40–1.20)
GFR: 88.39 mL/min (ref >60.00)
Glucose, Bld: 98 mg/dL (ref 70–99)
Potassium: 4.1 mEq/L (ref 3.5–5.1)
Sodium: 137 mEq/L (ref 135–145)
Total Bilirubin: 0.5 mg/dL (ref 0.2–1.2)
Total Protein: 7 g/dL (ref 6.0–8.3)

## 2018-12-02 LAB — LIPID PANEL
Cholesterol: 277 mg/dL — ABNORMAL HIGH (ref 0–200)
HDL: 70.7 mg/dL (ref >39.00)
LDL Cholesterol: 190 mg/dL — ABNORMAL HIGH (ref 0–99)
NonHDL: 205.81
Total CHOL/HDL Ratio: 4
Triglycerides: 78 mg/dL (ref 0.0–149.0)
VLDL: 15.6 mg/dL (ref 0.0–40.0)

## 2018-12-02 LAB — HEMOGLOBIN A1C: Hgb A1c MFr Bld: 5.9 % (ref 4.6–6.5)

## 2018-12-02 LAB — VITAMIN B12: Vitamin B-12: 447 pg/mL (ref 211–911)

## 2018-12-02 LAB — TSH: TSH: 1.43 u[IU]/mL (ref 0.35–4.50)

## 2018-12-03 ENCOUNTER — Encounter: Payer: Self-pay | Admitting: Internal Medicine

## 2018-12-18 ENCOUNTER — Ambulatory Visit (INDEPENDENT_AMBULATORY_CARE_PROVIDER_SITE_OTHER)
Admission: RE | Admit: 2018-12-18 | Discharge: 2018-12-18 | Disposition: A | Discharge status: Home / Self Care | Payer: Self-pay | Source: Ambulatory Visit | Attending: Internal Medicine | Admitting: Internal Medicine

## 2018-12-18 DIAGNOSIS — E782 Mixed hyperlipidemia: Secondary | ICD-10-CM

## 2018-12-19 ENCOUNTER — Encounter: Payer: Self-pay | Admitting: Internal Medicine

## 2018-12-28 ENCOUNTER — Other Ambulatory Visit: Payer: Self-pay | Admitting: Internal Medicine

## 2019-02-16 ENCOUNTER — Other Ambulatory Visit: Payer: Self-pay | Admitting: Internal Medicine

## 2019-02-16 NOTE — Telephone Encounter (Signed)
Check [place] registry last filled 12/01/2018../[email or web address]  

## 2019-03-19 ENCOUNTER — Telehealth: Payer: Self-pay | Admitting: Hematology and Oncology

## 2019-03-19 NOTE — Telephone Encounter (Signed)
[place] 5/27. Spoke with patient re changing f/u to 5/28. Per patient request f/u moved to 6/25.

## 2019-03-29 DIAGNOSIS — Z6822 Body mass index (BMI) 22.0-22.9, adult: Secondary | ICD-10-CM | POA: Diagnosis not present

## 2019-03-29 DIAGNOSIS — Z01419 Encounter for gynecological examination (general) (routine) without abnormal findings: Secondary | ICD-10-CM | POA: Diagnosis not present

## 2019-03-30 DIAGNOSIS — Z01419 Encounter for gynecological examination (general) (routine) without abnormal findings: Secondary | ICD-10-CM | POA: Diagnosis not present

## 2019-03-30 LAB — HM PAP SMEAR

## 2019-04-05 ENCOUNTER — Encounter (INDEPENDENT_AMBULATORY_CARE_PROVIDER_SITE_OTHER): Payer: Self-pay | Admitting: Ophthalmology

## 2019-04-05 ENCOUNTER — Ambulatory Visit (INDEPENDENT_AMBULATORY_CARE_PROVIDER_SITE_OTHER): Payer: 59 | Admitting: Ophthalmology

## 2019-04-05 ENCOUNTER — Other Ambulatory Visit: Payer: Self-pay

## 2019-04-05 DIAGNOSIS — H3322 Serous retinal detachment, left eye: Secondary | ICD-10-CM

## 2019-04-05 DIAGNOSIS — H04123 Dry eye syndrome of bilateral lacrimal glands: Secondary | ICD-10-CM

## 2019-04-05 DIAGNOSIS — H209 Unspecified iridocyclitis: Secondary | ICD-10-CM

## 2019-04-05 DIAGNOSIS — H20011 Primary iridocyclitis, right eye: Secondary | ICD-10-CM | POA: Diagnosis not present

## 2019-04-05 DIAGNOSIS — H5213 Myopia, bilateral: Secondary | ICD-10-CM

## 2019-04-05 DIAGNOSIS — H35413 Lattice degeneration of retina, bilateral: Secondary | ICD-10-CM

## 2019-04-05 DIAGNOSIS — H33323 Round hole, bilateral: Secondary | ICD-10-CM | POA: Diagnosis not present

## 2019-04-05 DIAGNOSIS — H3581 Retinal edema: Secondary | ICD-10-CM | POA: Diagnosis not present

## 2019-04-05 DIAGNOSIS — H33191 Other retinoschisis and retinal cysts, right eye: Secondary | ICD-10-CM | POA: Diagnosis not present

## 2019-04-05 DIAGNOSIS — H40053 Ocular hypertension, bilateral: Secondary | ICD-10-CM | POA: Diagnosis not present

## 2019-04-05 DIAGNOSIS — H25813 Combined forms of age-related cataract, bilateral: Secondary | ICD-10-CM

## 2019-04-05 NOTE — Progress Notes (Addendum)
[facility] Note  04/05/2019     CHIEF COMPLAINT Patient presents for Retina Evaluation   HISTORY OF PRESENT ILLNESS: Jeanne Parker is a 58 y.o. female who presents to the clinic today for:   HPI    Retina Evaluation    In right eye.  Associated Symptoms Pain and Photophobia.  Negative for Flashes, Distortion, Trauma, Jaw Claudication, Fever, Fatigue, Weight Loss, Shoulder/Hip pain, Scalp Tenderness, Glare, Redness, Blind Spot and Floaters.  Context:  distance vision, mid-range vision and near vision.  I, the attending physician,  performed the HPI with the patient and updated documentation appropriately.          Comments    Retinal eval per Dr. Wyatt Portela, pt referred for operculated hole OD, denies FOL, floaters       Last edited by Bernarda Caffey, MD on 04/05/2019 11:43 PM. (History)    pt was sent here by Dr. Zenia Resides for concern of operculated hole and "squiggly line", pt states her eye pressure was very high in his office today, pt denies history of herpes virus, pt denies any strange birth history, she states parents had cataracts at an older age, and siblings wear glasses, pt states she has dry eyes  Referring physician: Debbra Riding, MD [address] [place], [place] [address]  HISTORICAL INFORMATION:   Selected notes from the MEDICAL RECORD NUMBER Referred by Dr. Wyatt Portela for concern of iritis, operculated hole LEE: 05.11.20 ([facility]. [place])   Ocular Hx- PMH-anxiety, breast cancer, hypothyroid    CURRENT MEDICATIONS: Current Outpatient Medications (Ophthalmic Drugs)  Medication Sig  . cycloSPORINE (RESTASIS) 0.05 % ophthalmic emulsion Restasis 0.05 % eye drops in a dropperette  . prednisoLONE acetate (PRED FORTE) 1 % ophthalmic suspension    No current facility-administered medications for this visit.  (Ophthalmic Drugs)   Current Outpatient Medications (Other)  Medication Sig  . anastrozole (ARIMIDEX) 1 MG tablet Take 1  tablet (1 mg total) by mouth daily.  . citalopram (CELEXA) 20 MG tablet Take 1 tablet (20 mg total) by mouth daily.  . eszopiclone (LUNESTA) 2 MG TABS tablet TAKE 1 TABLET BY MOUTH AT  BEDTIME AS NEEDED FOR SLEEP IMMEDIATELY BEFORE BEDTIME.  . fluticasone (FLONASE) 50 MCG/ACT nasal spray fluticasone propionate 50 mcg/actuation nasal spray,suspension  . levothyroxine (SYNTHROID, LEVOTHROID) 75 MCG tablet TAKE 1 TABLET BY MOUTH  DAILY BEFORE BREAKFAST  . Multiple Vitamin (MULTIVITAMIN) capsule Take 1 capsule by mouth daily.  Marland Kitchen tretinoin (RETIN-A) 0.025 % cream Apply topically at bedtime.  . valACYclovir (VALTREX) 500 MG tablet    No current facility-administered medications for this visit.  (Other)      REVIEW OF SYSTEMS: ROS    Positive for: Eyes   Negative for: Constitutional, Gastrointestinal, Neurological, Skin, Genitourinary, Musculoskeletal, HENT, Endocrine, Cardiovascular, Respiratory, Psychiatric, Allergic/Imm, Heme/Lymph   Last edited by Debbrah Alar, COT on 04/05/2019  2:24 PM. (History)       ALLERGIES No Known Allergies  PAST MEDICAL HISTORY Past Medical History:  Diagnosis Date  . Anxiety    situational  . 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 History of chicken pox   . History of colon polyps   . Hypothyroid   . Personal history of radiation therapy    Past Surgical History:  Procedure Laterality Date  . BREAST BIOPSY    . BREAST EXCISIONAL BIOPSY Left 1998  . BREAST  LUMPECTOMY Right 2016  . BREAST SURGERY  1998   fibroid adenoma   . RADIOACTIVE SEED GUIDED PARTIAL MASTECTOMY WITH AXILLARY SENTINEL LYMPH NODE BIOPSY Right 11/16/2015   Procedure: RADIOACTIVE SEED GUIDED PARTIAL MASTECTOMY WITH AXILLARY SENTINEL LYMPH NODE BIOPSY;  Surgeon: Erroll Luna, MD;  Location: [facility];  Service: General;  Laterality: Right;    FAMILY HISTORY Family History  Problem Relation Age of Onset  . Colon cancer  Paternal Grandfather        died age 44  . Hypertension Mother 61  . Hyperlipidemia Mother   . Thyroid nodules Mother   . Diabetes Father 78  . Hyperlipidemia Father   . Hypertension Father   . GER disease Father   . Coronary artery disease Father        angioplasty age 61s  . Atrial fibrillation Father        on coumadin  . Gout Brother   . Thyroid cancer Sister     SOCIAL HISTORY Social History   Tobacco Use  . Smoking status: Never Smoker  . Smokeless tobacco: Never Used  Substance Use Topics  . Alcohol use: Yes    Comment: 1 to 2 glasses of wine a week   . Drug use: No         OPHTHALMIC EXAM:  Base Eye Exam    Visual Acuity (Snellen - Linear)      Right Left   Dist cc 20/100 20/40 -1   Dist ph cc 20/40-1 20/25 -1       Tonometry (Tonopen, 1:11 PM)      Right Left   Pressure 30 21       Pupils      Dark Light Shape React APD   Right 9 9 Round NR None   Left 9 9 Round NR None       Visual Fields (Counting fingers)      Left Right    Full Full       Extraocular Movement      Right Left    Full, Ortho Full, Ortho       Dilation    Both eyes:  1.0% Mydriacyl, 2.5% Phenylephrine @ 1:11 PM        Slit Lamp and Fundus Exam    Slit Lamp Exam      Right Left   Lids/Lashes mild Telangiectasia mild Telangiectasia   Conjunctiva/Sclera White and quiet White and quiet   Cornea mild Arcus, 1+ Punctate epithelial erosions mild Arcus, 1+ Punctate epithelial erosions   Anterior Chamber deep, 0.5+cell/pigment deep and clear   Iris Round and well dilated Round and well dilated   Lens 2+ Nuclear sclerosis, 1-2+ Cortical cataract 2+ Nuclear sclerosis, 1-2+ Cortical cataract   Vitreous Vitreous syneresis, +pigment in anterior vitreous, Posterior vitreous detachment Vitreous syneresis       Fundus Exam      Right Left   Disc Pink and Sharp, mild tilt Tilted disc, temporal Peripapillary atrophy, Pink and Sharp   C/D Ratio 0.5 0.55   Macula Flat, Good  foveal reflex, mild Retinal pigment epithelial mottling, mild IRF nasal macula Flat, Good foveal reflex, mild Retinal pigment epithelial mottling, No heme or edema   Vessels mild Vascular attenuation mild Vascular attenuation   Periphery Attached, superior lattice, atrophic hole at 1030, mild lattice with small hole at 1200 Attached, pigmented lattice at 0100 and 0430, pigmented lattice with atrophic hole and +SRF at 0500  Refraction    Wearing Rx      Sphere Cylinder Axis   Right -5.25 +0.50 130   Left -5.50 +0.50 117       Manifest Refraction      Sphere Cylinder Dist VA   Right -6.00 Sphere 20/50+2   Left -5.25 Sphere 20/40          IMAGING AND PROCEDURES  Imaging and Procedures for @TODAY @  OCT, Retina - OU - Both Eyes       Right Eye Quality was good. Central Foveal Thickness: 253. Progression has no prior data. Findings include abnormal foveal contour, intraretinal fluid, no SRF (Focal IRF eminating from IT disc, but remains extrafoveal).   Left Eye Quality was good. Central Foveal Thickness: 266. Progression has no prior data. Findings include normal foveal contour, no SRF, no IRF (Partial PVD).   Notes *Images captured and stored on drive  Diagnosis / Impression:  OD: Focal IRF extending from IT disc, but remains extrafoveal OS: NFP, no IRF/SRF   Clinical management:  See below  Abbreviations: NFP - Normal foveal profile. CME - cystoid macular edema. PED - pigment epithelial detachment. IRF - intraretinal fluid. SRF - subretinal fluid. EZ - ellipsoid zone. ERM - epiretinal membrane. ORA - outer retinal atrophy. ORT - outer retinal tubulation. SRHM - subretinal hyper-reflective material        Repair Retinal Detach, Photocoag - OS - Left Eye       LASER PROCEDURE NOTE  Procedure:  Barrier laser retinopexy using slit lamp laser, LEFT eye   Diagnosis:   Lattice degeneration w/ atrophic hole + focal SRF/RD, LEFT eye                     Patches of  lattice: 0100 superiorly; 0430-0500 inferiorly -- focal SRF/RD at 0500  Surgeon: Bernarda Caffey, MD, PhD  Anesthesia: Topical  Informed consent obtained, operative eye marked, and time out performed prior to initiation of laser.   Laser settings:  Lumenis [id number] laser, slit lamp Lens: [place] PRP 165 Power: 300 mW Spot size: 200 microns Duration: 30 msec  # spots: 469  Placement of laser: Using a [place] PRP 165 contact lens at the slit lamp, laser was placed in three confluent rows around patches of lattice at 0100 and 0430-0500 oclock anterior to equator with additional rows anteriorly.  Complications: None.  Patient tolerated the procedure well and received written and verbal post-procedure care information/education.                 ASSESSMENT/PLAN:    ICD-10-CM   1. Bilateral retinal lattice degeneration H35.413 Repair Retinal Detach, Photocoag - OS - Left Eye  2. 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 Retinal edema H35.81 OCT, Retina - OU - Both Eyes  5. Combined forms of age-related cataract of both eyes H25.813   6. 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 Myopia of both eyes H52.13     1-3. Lattice degeneration w/ atrophic holes, OU; OS with focal SRF/RD  - OD: atrophic hole at 1030, mild lattice with small hole at 1200  - OS: pigmented lattice at 0100 and 0430-0500--atrophic hole and +SRF at 0500  - discussed findings, prognosis, and treatment options including observation  - recommend laser retinopexy OU, OS today, 05.11.20  - pt wishes to proceed with  laser  - RBA of procedure discussed, questions answered  - informed consent obtained and signed  - see procedure note  - start PF QID OS x7 days  - f/u in 1 wk, DFE, OCT, laser retinopexy OD  4. Retinal edema OD  - nasal IRF/cystic macular changes extending from inf temp disc  -  mild CME vs retinoschisis vs optic pit fluid  - will check FA next week  5. Mixed form age related cataract OU  - The symptoms of cataract, surgical options, and treatments and risks were discussed with patient.  - discussed diagnosis and progression  - not yet visually significant  - monitor for now  6. Iritis OD  - under the expert management of Dr. Katy Fitch  7. Myopia OU  - discussed association of myopia with lattice degeneration and RT/RD  - monitor  Ophthalmic Meds Ordered this visit:  No orders of the defined types were placed in this encounter.      Return in about 2 weeks (around 04/19/2019) for f/u lattice degeneraton OU, DFE, OCT.  There are no Patient Instructions on file for this visit.   Explained the diagnoses, plan, and follow up with the patient and they expressed understanding.  Patient expressed understanding of the importance of proper follow up care.   This document serves as a record of services personally performed by Gardiner Sleeper, MD, PhD. It was created on their behalf by Ernest Mallick, OA, an ophthalmic assistant. The creation of this record is the provider's dictation and/or activities during the visit.    Electronically signed by: Ernest Mallick, OA  05.11.2020 11:43 PM    Gardiner Sleeper, M.D., Ph.D. Diseases & Surgery of the Retina and Vitreous [facility]  I have reviewed the above documentation for accuracy and completeness, and I agree with the above. Gardiner Sleeper, M.D., Ph.D. 04/05/19 11:48 PM   Abbreviations: M myopia (nearsighted); A astigmatism; H hyperopia (farsighted); P presbyopia; Mrx spectacle prescription;  CTL contact lenses; OD right eye; OS left eye; OU both eyes  XT exotropia; ET esotropia; PEK punctate epithelial keratitis; PEE punctate epithelial erosions; DES dry eye syndrome; MGD meibomian gland dysfunction; ATs artificial tears; PFAT's preservative free artificial tears; [place] nuclear sclerotic cataract;  PSC posterior subcapsular cataract; ERM epi-retinal membrane; PVD posterior vitreous detachment; RD retinal detachment; DM diabetes mellitus; DR diabetic retinopathy; NPDR non-proliferative diabetic retinopathy; PDR proliferative diabetic retinopathy; CSME clinically significant macular edema; DME diabetic macular edema; dbh dot blot hemorrhages; CWS cotton wool spot; POAG primary open angle glaucoma; C/D cup-to-disc ratio; HVF humphrey visual field; GVF goldmann visual field; OCT optical coherence tomography; IOP intraocular pressure; BRVO Branch retinal vein occlusion; CRVO central retinal vein occlusion; CRAO central retinal artery occlusion; BRAO branch retinal artery occlusion; RT retinal tear; SB scleral buckle; PPV pars plana vitrectomy; VH Vitreous hemorrhage; PRP panretinal laser photocoagulation; IVK intravitreal kenalog; VMT vitreomacular traction; MH Macular hole;  NVD neovascularization of the disc; NVE neovascularization elsewhere; AREDS age related eye disease study; ARMD age related macular degeneration; POAG primary open angle glaucoma; EBMD epithelial/anterior basement membrane dystrophy; ACIOL anterior chamber intraocular lens; IOL intraocular lens; PCIOL posterior chamber intraocular lens; Phaco/IOL phacoemulsification with intraocular lens placement; [place] photorefractive keratectomy; LASIK laser assisted in situ keratomileusis; HTN hypertension; DM diabetes mellitus; COPD chronic obstructive pulmonary disease

## 2019-04-12 ENCOUNTER — Other Ambulatory Visit: Payer: Self-pay

## 2019-04-12 ENCOUNTER — Ambulatory Visit (INDEPENDENT_AMBULATORY_CARE_PROVIDER_SITE_OTHER): Payer: 59 | Admitting: Ophthalmology

## 2019-04-12 ENCOUNTER — Encounter (INDEPENDENT_AMBULATORY_CARE_PROVIDER_SITE_OTHER): Payer: Self-pay | Admitting: Ophthalmology

## 2019-04-12 DIAGNOSIS — H3581 Retinal edema: Secondary | ICD-10-CM

## 2019-04-12 DIAGNOSIS — H40053 Ocular hypertension, bilateral: Secondary | ICD-10-CM

## 2019-04-12 DIAGNOSIS — H33101 Unspecified retinoschisis, right eye: Secondary | ICD-10-CM

## 2019-04-12 DIAGNOSIS — H35413 Lattice degeneration of retina, bilateral: Secondary | ICD-10-CM

## 2019-04-12 DIAGNOSIS — H33323 Round hole, bilateral: Secondary | ICD-10-CM | POA: Diagnosis not present

## 2019-04-12 DIAGNOSIS — H25813 Combined forms of age-related cataract, bilateral: Secondary | ICD-10-CM

## 2019-04-12 DIAGNOSIS — H5213 Myopia, bilateral: Secondary | ICD-10-CM

## 2019-04-12 DIAGNOSIS — H35033 Hypertensive retinopathy, bilateral: Secondary | ICD-10-CM | POA: Diagnosis not present

## 2019-04-12 DIAGNOSIS — H209 Unspecified iridocyclitis: Secondary | ICD-10-CM

## 2019-04-12 DIAGNOSIS — H33191 Other retinoschisis and retinal cysts, right eye: Secondary | ICD-10-CM | POA: Diagnosis not present

## 2019-04-12 DIAGNOSIS — H3322 Serous retinal detachment, left eye: Secondary | ICD-10-CM

## 2019-04-12 DIAGNOSIS — H20011 Primary iridocyclitis, right eye: Secondary | ICD-10-CM | POA: Diagnosis not present

## 2019-04-12 NOTE — Progress Notes (Addendum)
[facility] Note  04/12/2019     CHIEF COMPLAINT Patient presents for Retina Follow Up   HISTORY OF PRESENT ILLNESS: Jeanne Parker is a 57 y.o. female who presents to the clinic today for:   HPI    Retina Follow Up    Patient presents with  Retinal Break/Detachment.  In both eyes.  Severity is moderate.  Duration of 1 week.  Since onset it is gradually improving.  I, the attending physician,  performed the HPI with the patient and updated documentation appropriately.          Comments    Patient states vision improving especially in OD-about 95% improvement. Vision seems good OS. No new floaters or flashes. Saw Dr. Katy Fitch today-will re-start gtt for IOP control OU. Also on oral valtrex per Dr. Katy Fitch to prevent viral eye infection.        Last edited by Bernarda Caffey, MD on 04/12/2019  4:47 PM. (History)    pt here for laser retinopexy OD today   Referring physician: Binnie Rail, MD [place], [place] [address]  HISTORICAL INFORMATION:   Selected notes from the MEDICAL RECORD NUMBER Referred by Dr. Wyatt Portela for concern of iritis, operculated hole LEE: 05.11.20 (S. [place])   Ocular Hx- PMH-anxiety, breast cancer, hypothyroid    CURRENT MEDICATIONS: Current Outpatient Medications (Ophthalmic Drugs)  Medication Sig  . cycloSPORINE (RESTASIS) 0.05 % ophthalmic emulsion Place into both eyes 2 (two) times daily.   . prednisoLONE acetate (PRED FORTE) 1 % ophthalmic suspension Place 1 drop into the right eye 4 (four) times daily.    No current facility-administered medications for this visit.  (Ophthalmic Drugs)   Current Outpatient Medications (Other)  Medication Sig  . anastrozole (ARIMIDEX) 1 MG tablet Take 1 tablet (1 mg total) by mouth daily.  . citalopram (CELEXA) 20 MG tablet Take 1 tablet (20 mg total) by mouth daily.  . eszopiclone (LUNESTA) 2 MG TABS tablet TAKE 1 TABLET BY MOUTH AT  BEDTIME AS NEEDED FOR SLEEP IMMEDIATELY  BEFORE BEDTIME.  . fluticasone (FLONASE) 50 MCG/ACT nasal spray fluticasone propionate 50 mcg/actuation nasal spray,suspension  . levothyroxine (SYNTHROID, LEVOTHROID) 75 MCG tablet TAKE 1 TABLET BY MOUTH  DAILY BEFORE BREAKFAST  . Multiple Vitamin (MULTIVITAMIN) capsule Take 1 capsule by mouth daily.  Marland Kitchen tretinoin (RETIN-A) 0.025 % cream Apply topically at bedtime.  . valACYclovir (VALTREX) 500 MG tablet    No current facility-administered medications for this visit.  (Other)      REVIEW OF SYSTEMS: ROS    Positive for: Eyes   Negative for: Constitutional, Gastrointestinal, Neurological, Skin, Genitourinary, Musculoskeletal, HENT, Endocrine, Cardiovascular, Respiratory, Psychiatric, Allergic/Imm, Heme/Lymph   Last edited by Roselee Nova D on 04/12/2019  2:54 PM. (History)       ALLERGIES No Known Allergies  PAST MEDICAL HISTORY Past Medical History:  Diagnosis Date  . Anxiety    situational  . 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 History of chicken pox   . History of colon polyps   . Hypothyroid   . Personal history of radiation therapy    Past Surgical History:  Procedure Laterality Date  . BREAST BIOPSY    . BREAST EXCISIONAL BIOPSY Left 1998  . BREAST LUMPECTOMY Right 2016  . BREAST SURGERY  1998   fibroid adenoma   . RADIOACTIVE SEED GUIDED PARTIAL MASTECTOMY WITH AXILLARY SENTINEL LYMPH NODE BIOPSY Right  11/16/2015   Procedure: RADIOACTIVE SEED GUIDED PARTIAL MASTECTOMY WITH AXILLARY SENTINEL LYMPH NODE BIOPSY;  Surgeon: Erroll Luna, MD;  Location: [facility];  Service: General;  Laterality: Right;    FAMILY HISTORY Family History  Problem Relation Age of Onset  . Colon cancer Paternal Grandfather        died age 34  . Hypertension Mother 79  . Hyperlipidemia Mother   . Thyroid nodules Mother   . Diabetes Father 54  . Hyperlipidemia Father   . Hypertension Father   . GER disease Father   . Coronary  artery disease Father        angioplasty age 17s  . Atrial fibrillation Father        on coumadin  . Gout Brother   . Thyroid cancer Sister     SOCIAL HISTORY Social History   Tobacco Use  . Smoking status: Never Smoker  . Smokeless tobacco: Never Used  Substance Use Topics  . Alcohol use: Yes    Comment: 1 to 2 glasses of wine a week   . Drug use: No         OPHTHALMIC EXAM:  Base Eye Exam    Visual Acuity (Snellen - Linear)      Right Left   Dist cc 20/40 -2 20/40   Dist ph cc 20/30 -2 20/25 -1   Correction:  Glasses       Tonometry (Tonopen, 3:05 PM)      Right Left   Pressure 30 21       Pupils      Dark Light Shape React APD   Right 6 5 Round Brisk None   Left 6 5 Round Brisk None       Visual Fields (Counting fingers)      Left Right    Full Full       Extraocular Movement      Right Left    Full, Ortho Full, Ortho       Neuro/Psych    Oriented x3:  Yes   Mood/Affect:  Normal        Slit Lamp and Fundus Exam    Slit Lamp Exam      Right Left   Lids/Lashes mild Telangiectasia mild Telangiectasia   Conjunctiva/Sclera White and quiet White and quiet   Cornea mild Arcus, 1+ Punctate epithelial erosions mild Arcus, 1+ Punctate epithelial erosions   Anterior Chamber deep, 0.5+cell/pigment deep and clear   Iris Round and well dilated Round and well dilated   Lens 2+ Nuclear sclerosis, 1-2+ Cortical cataract 2+ Nuclear sclerosis, 1-2+ Cortical cataract   Vitreous Vitreous syneresis, +pigment in anterior vitreous, Posterior vitreous detachment Vitreous syneresis       Fundus Exam      Right Left   Disc Pink and Sharp, mild tilt Tilted disc, temporal Peripapillary atrophy, Pink and Sharp   C/D Ratio 0.5 0.55   Macula Flat, Good foveal reflex, mild Retinal pigment epithelial mottling, mild IRF nasal macula Flat, Good foveal reflex, mild Retinal pigment epithelial mottling, No heme or edema   Vessels mild Vascular attenuation mild Vascular  attenuation   Periphery Attached, superior lattice, atrophic hole at 1030, mild lattice with small hole at 1200 Attached, pigmented lattice at 0100 and 0430, pigmented lattice with atrophic hole and +SRF at 0500        Refraction    Wearing Rx      Sphere Cylinder Axis   Right -5.25 +0.50 130  Left -5.50 +0.50 117          IMAGING AND PROCEDURES  Imaging and Procedures for @TODAY @  OCT, Retina - OU - Both Eyes       Right Eye Quality was good. Central Foveal Thickness: 249. Progression has been stable. Findings include abnormal foveal contour, intraretinal fluid, no SRF (Focal IRF eminating from IT disc, but remains extrafoveal, intra-retinal stranding -- suspect retinoschisis).   Left Eye Quality was good. Central Foveal Thickness: 267. Progression has been stable. Findings include normal foveal contour, no SRF, no IRF (Partial PVD).   Notes *Images captured and stored on drive  Diagnosis / Impression:  OD: Focal IRF extending from IT disc, but remains extrafoveal, intra-retinal stranding -- suspect retinoschisis OS: NFP, no IRF/SRF   Clinical management:  See below  Abbreviations: NFP - Normal foveal profile. CME - cystoid macular edema. PED - pigment epithelial detachment. IRF - intraretinal fluid. SRF - subretinal fluid. EZ - ellipsoid zone. ERM - epiretinal membrane. ORA - outer retinal atrophy. ORT - outer retinal tubulation. SRHM - subretinal hyper-reflective material        Fluorescein Angiography [place] (Transit OD)       Right Eye   Progression has no prior data. Early phase findings include normal observations. Mid/Late phase findings include normal observations.   Left Eye   Progression has no prior data. Early phase findings include normal observations. Mid/Late phase findings include normal observations.   Notes Images stored on drive;   Impression: Normal study OU no macular leakage / CME OD          Repair Retinal Breaks,  Laser - OD - Right Eye       LASER PROCEDURE NOTE  Procedure:  Barrier laser retinopexy using slit lamp laser, RIGHT eye   Diagnosis:   Lattice degeneration w/ atrophic holes, RIGHT eye                     Patches of lattice: superiorly with holes at 1030 and 1200  Surgeon: Bernarda Caffey, MD, PhD  Anesthesia: Topical  Informed consent obtained, operative eye marked, and time out performed prior to initiation of laser.   Laser settings:  Lumenis [id number] laser, slit lamp Lens: [place] PRP 165 Power: 280 mW Spot size: 200 microns Duration: 30 msec  # spots: 303  Placement of laser: Using a [place] PRP 165 contact lens at the slit lamp, laser was placed in three confluent rows around patches of lattice w/ atrophic holes from 1030-1200 oclock anterior to equator with additional rows anteriorly.  Complications: None.  Patient tolerated the procedure well and received written and verbal post-procedure care information/education.                ASSESSMENT/PLAN:    ICD-10-CM   1. Bilateral retinal lattice degeneration H35.413 Repair Retinal Breaks, Laser - OD - Right Eye  2. 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 Retinal edema H35.81 OCT, Retina - OU - Both Eyes  6. Combined forms of age-related cataract of both eyes H25.813   7. 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 Myopia of both eyes H52.13   10. Hypertensive retinopathy of both eyes H35.033 Fluorescein Angiography [place] (Transit OD)    1-3. Lattice degeneration w/ atrophic holes, OU; OS  with focal SRF/RD  - OD: atrophic hole at 1030, mild lattice with small hole at 1200  - OS: pigmented lattice at 0100 and 0430-0500--atrophic hole and +SRF at 0500  - discussed findings, prognosis, and treatment options including  observation  - s/p laser retinopexy OS (05.11.20)  - recommend laser retinopexy OD today, 05.18.20  - pt wishes to proceed with laser  - RBA of procedure discussed, questions answered  - informed consent obtained and signed  - see procedure note  - start PF QID OD x7 days  - f/u in 2-3 wks, DFE, OCT  4,5. Retinal edema, retinoschisis OD  - nasal IRF/cystic macular changes extending from inf temp disc -- +intraretinal stranding  - FA today without hyperfluorescence/leakage suggesting not CME or optic pit   - likely retinoschisis  - stable from prior -- will monitor  6. Mixed form age related cataract OU  - The symptoms of cataract, surgical options, and treatments and risks were discussed with patient.  - discussed diagnosis and progression  - not yet visually significant  - monitor for now  7,8. Iritis with ocular hypertension OD  - under the expert management of Dr. Katy Fitch  9. Myopia OU  - discussed association of myopia with lattice degeneration and RT/RD  - monitor   Ophthalmic Meds Ordered this visit:  No orders of the defined types were placed in this encounter.      Return for 2-3 wks, POV, Dilated Exam, OCT.  There are no Patient Instructions on file for this visit.   Explained the diagnoses, plan, and follow up with the patient and they expressed understanding.  Patient expressed understanding of the importance of proper follow up care.   This document serves as a record of services personally performed by Gardiner Sleeper, MD, PhD. It was created on their behalf by Ernest Mallick, OA, an ophthalmic assistant. The creation of this record is the provider's dictation and/or activities during the visit.    Electronically signed by: Ernest Mallick, OA  05.18.2020 5:37 PM    Gardiner Sleeper, M.D., Ph.D. Diseases & Surgery of the Retina and Vitreous [facility]  I have reviewed the above documentation for accuracy and completeness, and I agree  with the above. Gardiner Sleeper, M.D., Ph.D. 04/12/19 5:37 PM    Abbreviations: M myopia (nearsighted); A astigmatism; H hyperopia (farsighted); P presbyopia; Mrx spectacle prescription;  CTL contact lenses; OD right eye; OS left eye; OU both eyes  XT exotropia; ET esotropia; PEK punctate epithelial keratitis; PEE punctate epithelial erosions; DES dry eye syndrome; MGD meibomian gland dysfunction; ATs artificial tears; PFAT's preservative free artificial tears; [place] nuclear sclerotic cataract; PSC posterior subcapsular cataract; ERM epi-retinal membrane; PVD posterior vitreous detachment; RD retinal detachment; DM diabetes mellitus; DR diabetic retinopathy; NPDR non-proliferative diabetic retinopathy; PDR proliferative diabetic retinopathy; CSME clinically significant macular edema; DME diabetic macular edema; dbh dot blot hemorrhages; CWS cotton wool spot; POAG primary open angle glaucoma; C/D cup-to-disc ratio; HVF humphrey visual field; GVF goldmann visual field; OCT optical coherence tomography; IOP intraocular pressure; BRVO Branch retinal vein occlusion; CRVO central retinal vein occlusion; CRAO central retinal artery occlusion; BRAO branch retinal artery occlusion; RT retinal tear; SB scleral buckle; PPV pars plana vitrectomy; VH Vitreous hemorrhage; PRP panretinal laser photocoagulation; IVK intravitreal kenalog; VMT vitreomacular traction; MH Macular hole;  NVD neovascularization of the disc; NVE neovascularization elsewhere; AREDS age related eye disease study; ARMD age related macular degeneration; POAG primary open angle glaucoma;  EBMD epithelial/anterior basement membrane dystrophy; ACIOL anterior chamber intraocular lens; IOL intraocular lens; PCIOL posterior chamber intraocular lens; Phaco/IOL phacoemulsification with intraocular lens placement; [place] photorefractive keratectomy; LASIK laser assisted in situ keratomileusis; HTN hypertension; DM diabetes mellitus; COPD chronic obstructive pulmonary  disease

## 2019-04-13 DIAGNOSIS — H0100B Unspecified blepharitis left eye, upper and lower eyelids: Secondary | ICD-10-CM | POA: Diagnosis not present

## 2019-04-13 DIAGNOSIS — H0100A Unspecified blepharitis right eye, upper and lower eyelids: Secondary | ICD-10-CM | POA: Diagnosis not present

## 2019-04-13 DIAGNOSIS — H04123 Dry eye syndrome of bilateral lacrimal glands: Secondary | ICD-10-CM | POA: Diagnosis not present

## 2019-04-20 ENCOUNTER — Ambulatory Visit: Payer: 59 | Admitting: Hematology and Oncology

## 2019-04-21 ENCOUNTER — Ambulatory Visit: Payer: 59 | Admitting: Hematology and Oncology

## 2019-04-24 ENCOUNTER — Other Ambulatory Visit: Payer: Self-pay | Admitting: Internal Medicine

## 2019-04-26 ENCOUNTER — Other Ambulatory Visit: Payer: Self-pay

## 2019-04-26 ENCOUNTER — Ambulatory Visit (INDEPENDENT_AMBULATORY_CARE_PROVIDER_SITE_OTHER): Payer: 59 | Admitting: Ophthalmology

## 2019-04-26 ENCOUNTER — Encounter (INDEPENDENT_AMBULATORY_CARE_PROVIDER_SITE_OTHER): Payer: Self-pay | Admitting: Ophthalmology

## 2019-04-26 DIAGNOSIS — H33323 Round hole, bilateral: Secondary | ICD-10-CM

## 2019-04-26 DIAGNOSIS — H1830 Unspecified corneal membrane change: Secondary | ICD-10-CM

## 2019-04-26 DIAGNOSIS — H5213 Myopia, bilateral: Secondary | ICD-10-CM

## 2019-04-26 DIAGNOSIS — H3581 Retinal edema: Secondary | ICD-10-CM | POA: Diagnosis not present

## 2019-04-26 DIAGNOSIS — H33101 Unspecified retinoschisis, right eye: Secondary | ICD-10-CM

## 2019-04-26 DIAGNOSIS — H3322 Serous retinal detachment, left eye: Secondary | ICD-10-CM

## 2019-04-26 DIAGNOSIS — H35413 Lattice degeneration of retina, bilateral: Secondary | ICD-10-CM

## 2019-04-26 DIAGNOSIS — H40053 Ocular hypertension, bilateral: Secondary | ICD-10-CM

## 2019-04-26 DIAGNOSIS — H25813 Combined forms of age-related cataract, bilateral: Secondary | ICD-10-CM

## 2019-04-26 DIAGNOSIS — H209 Unspecified iridocyclitis: Secondary | ICD-10-CM

## 2019-04-26 MED ORDER — OFLOXACIN 0.3 % OP SOLN: 1.0000 [drp] | Freq: Four times a day (QID) | OPHTHALMIC | 0 refills | Status: AC

## 2019-04-26 NOTE — Progress Notes (Addendum)
[facility] Note  04/26/2019     CHIEF COMPLAINT Patient presents for Retina Evaluation   HISTORY OF PRESENT ILLNESS: Jeanne Parker is a 58 y.o. female who presents to the clinic today for:   HPI    Retina Evaluation    In both eyes.  This started weeks ago.  Duration of weeks.  Context:  distance vision, near vision and mid-range vision.  I, the attending physician,  performed the HPI with the patient and updated documentation appropriately.          Comments    Patient believes vision is improving.  Patient denies eye pain or discomfort.  Patient denies any new or worsening floaters or fol OU.       Last edited by Bernarda Caffey, MD on 04/26/2019  2:15 PM. (History)    pt states she saw Dr. Satira Sark at [facility] for a second opinion of ocular hypertension and was pleased with what he said so she is going to transfer her care there permanently, her next appt with him is June 30th, she states she is using a pressure drop twice a day, taking an anti-viral twice a day, but is not using any steroid drops   Referring physician: Binnie Rail, MD [place], [place] [address]  HISTORICAL INFORMATION:   Selected notes from the MEDICAL RECORD NUMBER Referred by Dr. Wyatt Portela for concern of iritis, operculated hole LEE: 05.11.20 (S. Groat)   Ocular Hx- PMH-anxiety, breast cancer, hypothyroid    CURRENT MEDICATIONS: Current Outpatient Medications (Ophthalmic Drugs)  Medication Sig  . cycloSPORINE (RESTASIS) 0.05 % ophthalmic emulsion Place into both eyes 2 (two) times daily.   Marland Kitchen ofloxacin (OCUFLOX) 0.3 % ophthalmic solution Place 1 drop into the right eye 4 (four) times daily for 10 days.  . prednisoLONE acetate (PRED FORTE) 1 % ophthalmic suspension Place 1 drop into the right eye 4 (four) times daily.    No current facility-administered medications for this visit.  (Ophthalmic Drugs)   Current Outpatient Medications (Other)   Medication Sig  . anastrozole (ARIMIDEX) 1 MG tablet Take 1 tablet (1 mg total) by mouth daily.  . citalopram (CELEXA) 20 MG tablet Take 1 tablet (20 mg total) by mouth daily.  . eszopiclone (LUNESTA) 2 MG TABS tablet TAKE 1 TABLET BY MOUTH AT  BEDTIME AS NEEDED FOR SLEEP IMMEDIATELY BEFORE BEDTIME.  . fluticasone (FLONASE) 50 MCG/ACT nasal spray fluticasone propionate 50 mcg/actuation nasal spray,suspension  . levothyroxine (SYNTHROID) 75 MCG tablet TAKE 1 TABLET BY MOUTH  DAILY BEFORE BREAKFAST  . Multiple Vitamin (MULTIVITAMIN) capsule Take 1 capsule by mouth daily.  Marland Kitchen tretinoin (RETIN-A) 0.025 % cream Apply topically at bedtime.  . valACYclovir (VALTREX) 500 MG tablet    No current facility-administered medications for this visit.  (Other)      REVIEW OF SYSTEMS: ROS    Negative for: Constitutional, Gastrointestinal, Neurological, Skin, Genitourinary, Musculoskeletal, HENT, Endocrine, Cardiovascular, Eyes, Respiratory, Psychiatric, Allergic/Imm, Heme/Lymph   Last edited by Doneen Poisson on 04/26/2019  1:25 PM. (History)       ALLERGIES No Known Allergies  PAST MEDICAL HISTORY Past Medical History:  Diagnosis Date  . Anxiety    situational  . 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 History of chicken pox   . History of colon polyps   . Hypothyroid   . Personal history of radiation therapy  Past Surgical History:  Procedure Laterality Date  . BREAST BIOPSY    . BREAST EXCISIONAL BIOPSY Left 1998  . BREAST LUMPECTOMY Right 2016  . BREAST SURGERY  1998   fibroid adenoma   . RADIOACTIVE SEED GUIDED PARTIAL MASTECTOMY WITH AXILLARY SENTINEL LYMPH NODE BIOPSY Right 11/16/2015   Procedure: RADIOACTIVE SEED GUIDED PARTIAL MASTECTOMY WITH AXILLARY SENTINEL LYMPH NODE BIOPSY;  Surgeon: Erroll Luna, MD;  Location: [facility];  Service: General;  Laterality: Right;    FAMILY HISTORY Family History  Problem Relation  Age of Onset  . Colon cancer Paternal Grandfather        died age 79  . Hypertension Mother 74  . Hyperlipidemia Mother   . Thyroid nodules Mother   . Diabetes Father 52  . Hyperlipidemia Father   . Hypertension Father   . GER disease Father   . Coronary artery disease Father        angioplasty age 30s  . Atrial fibrillation Father        on coumadin  . Gout Brother   . Thyroid cancer Sister     SOCIAL HISTORY Social History   Tobacco Use  . Smoking status: Never Smoker  . Smokeless tobacco: Never Used  Substance Use Topics  . Alcohol use: Yes    Comment: 1 to 2 glasses of wine a week   . Drug use: No         OPHTHALMIC EXAM:  Base Eye Exam    Visual Acuity (Snellen - Linear)      Right Left   Dist cc 20/40 -2 20/30 +2   Dist ph cc 20/30 +2 20/25 -1   Correction:  Glasses       Tonometry (Tonopen, 1:43 PM)      Right Left   Pressure 15 16       Pupils      Dark Light Shape React APD   Right 6 5 Round Brisk 0   Left 6 5 Round Brisk 0       Extraocular Movement      Right Left    Full Full       Neuro/Psych    Oriented x3:  Yes   Mood/Affect:  Normal       Dilation    Both eyes:  1.0% Mydriacyl, 2.5% Phenylephrine @ 1:43 PM        Slit Lamp and Fundus Exam    Slit Lamp Exam      Right Left   Lids/Lashes mild Telangiectasia mild Telangiectasia   Conjunctiva/Sclera White and quiet White and quiet   Cornea mild Arcus, 1+ Punctate epithelial erosions superiorly, linear epi defects superiorly, triangular epi defect at 0200 mild Arcus, 1+ Punctate epithelial erosions   Anterior Chamber deep, 0.5+cell/pigment deep and clear   Iris Round and well dilated Round and well dilated   Lens 2+ Nuclear sclerosis, 1-2+ Cortical cataract 2+ Nuclear sclerosis, 1-2+ Cortical cataract   Vitreous Vitreous syneresis, +pigment in anterior vitreous, Posterior vitreous detachment Vitreous syneresis       Fundus Exam      Right Left   Disc Pink and Sharp, mild  tilt Tilted disc, temporal Peripapillary atrophy, Pink and Sharp   C/D Ratio 0.5 0.55   Macula Flat, Good foveal reflex, mild Retinal pigment epithelial mottling, mild IRF nasal macula Flat, Good foveal reflex, mild Retinal pigment epithelial mottling, No heme or edema   Vessels mild Vascular attenuation mild Vascular attenuation   Periphery  Attached, superior lattice, atrophic hole at 1030 -- good laser surrounding, mild lattice with small hole at 1200 -- light laser changes, focal areas of pigment clumping at 0600 and 0700 Attached, pigmented lattice at 0100 -- light laser surrounding and 0430 -- good laser surrounding, pigmented lattice with atrophic hole and +SRF at 0500        Refraction    Wearing Rx      Sphere Cylinder Axis   Right -5.25 +0.50 130   Left -5.50 +0.50 117          IMAGING AND PROCEDURES  Imaging and Procedures for @TODAY @  OCT, Retina - OU - Both Eyes       Right Eye Quality was good. Central Foveal Thickness: 246. Progression has been stable. Findings include abnormal foveal contour, intraretinal fluid, no SRF (Focal IRF eminating from IT disc, but remains extrafoveal, intra-retinal stranding -- suspect retinoschisis).   Left Eye Quality was good. Central Foveal Thickness: 261. Progression has been stable. Findings include normal foveal contour, no SRF, no IRF (Partial PVD).   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 CME - cystoid macular edema. PED - pigment epithelial detachment. IRF - intraretinal fluid. SRF - subretinal fluid. EZ - ellipsoid zone. ERM - epiretinal membrane. ORA - outer retinal atrophy. ORT - outer retinal tubulation. SRHM - subretinal hyper-reflective material                  ASSESSMENT/PLAN:    ICD-10-CM   1. Bilateral retinal lattice degeneration H35.413   2. Retinal hole of both eyes H33.323   3. 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 Retinal edema H35.81 OCT, Retina - OU - Both Eyes  6. Combined forms of age-related cataract of both eyes H25.813   7. 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 Myopia of both eyes H52.13   10. 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 Lattice degeneration w/ atrophic holes, OU; OS with focal SRF/RD  - OD: atrophic hole at 1030, mild lattice with small hole at 1200  - OS: pigmented lattice at 0100 and 0430-0500--atrophic hole and +SRF at 0500  - s/p laser retinopexy OS (05.11.20) -- light laser changes at 12, good at 1030  - s/p laser retinopexy OD (05.18.20) -- light laser changes at 1, good at 430-5  - recommend laser retinopexy fill-in OU -- pt wishes to come back on Friday  - f/u Friday for laser pexy touch up OU  4,5. Retinal edema, retinoschisis OD  - nasal IRF/cystic macular changes extending from inf temp disc -- +intraretinal stranding  - FA (05.18.20) without hyperfluorescence/leakage suggesting not CME or optic pit   - stable from prior -- will monitor  6. Mixed form age related cataract OU  - The symptoms of cataract, surgical options, and treatments and risks were discussed with patient.  - discussed diagnosis and progression  - not yet visually significant  - monitor for now  7,8. Iritis with ocular hypertension OD  - under the expert management of Dr. Katy Fitch - pt is switching care to Dr. Satira Sark at [facility]  - Iritis and IOP improved today  9. Myopia OU  -  discussed association of myopia with lattice degeneration and RT/RD  - monitor  10. Corneal Epithelial Defect OD  - appears to be healing, but unclear etiology --?inadvertent trauma while sleeping?  - start ofloxican QID OD  - f/u Friday for K  check   Ophthalmic Meds Ordered this visit:  Meds ordered this encounter  Medications  . ofloxacin (OCUFLOX) 0.3 % ophthalmic solution    Sig: Place 1 drop into the right eye 4 (four) times daily for 10 days.    Dispense:  10 mL    Refill:  0       Return in about 4 days (around 04/30/2019) for f/u laser retinopexy OU at 10:00AM.  There are no Patient Instructions on file for this visit.   Explained the diagnoses, plan, and follow up with the patient and they expressed understanding.  Patient expressed understanding of the importance of proper follow up care.   This document serves as a record of services personally performed by Gardiner Sleeper, MD, PhD. It was created on their behalf by Ernest Mallick, OA, an ophthalmic assistant. The creation of this record is the provider's dictation and/or activities during the visit.    Electronically signed by: Ernest Mallick, OA  06.01.2020 4:55 PM    Gardiner Sleeper, M.D., Ph.D. Diseases & Surgery of the Retina and Vitreous [facility]  I have reviewed the above documentation for accuracy and completeness, and I agree with the above. Gardiner Sleeper, M.D., Ph.D. 04/26/19 4:55 PM    Abbreviations: M myopia (nearsighted); A astigmatism; H hyperopia (farsighted); P presbyopia; Mrx spectacle prescription;  CTL contact lenses; OD right eye; OS left eye; OU both eyes  XT exotropia; ET esotropia; PEK punctate epithelial keratitis; PEE punctate epithelial erosions; DES dry eye syndrome; MGD meibomian gland dysfunction; ATs artificial tears; PFAT's preservative free artificial tears; [place] nuclear sclerotic cataract; PSC posterior subcapsular cataract; ERM epi-retinal membrane; PVD posterior vitreous detachment; RD retinal detachment; DM diabetes mellitus; DR diabetic retinopathy; NPDR non-proliferative diabetic retinopathy; PDR proliferative diabetic retinopathy; CSME clinically significant macular edema; DME diabetic macular edema;  dbh dot blot hemorrhages; CWS cotton wool spot; POAG primary open angle glaucoma; C/D cup-to-disc ratio; HVF humphrey visual field; GVF goldmann visual field; OCT optical coherence tomography; IOP intraocular pressure; BRVO Branch retinal vein occlusion; CRVO central retinal vein occlusion; CRAO central retinal artery occlusion; BRAO branch retinal artery occlusion; RT retinal tear; SB scleral buckle; PPV pars plana vitrectomy; VH Vitreous hemorrhage; PRP panretinal laser photocoagulation; IVK intravitreal kenalog; VMT vitreomacular traction; MH Macular hole;  NVD neovascularization of the disc; NVE neovascularization elsewhere; AREDS age related eye disease study; ARMD age related macular degeneration; POAG primary open angle glaucoma; EBMD epithelial/anterior basement membrane dystrophy; ACIOL anterior chamber intraocular lens; IOL intraocular lens; PCIOL posterior chamber intraocular lens; Phaco/IOL phacoemulsification with intraocular lens placement; [place] photorefractive keratectomy; LASIK laser assisted in situ keratomileusis; HTN hypertension; DM diabetes mellitus; COPD chronic obstructive pulmonary disease

## 2019-04-28 NOTE — Progress Notes (Signed)
[facility] Note  04/30/2019     CHIEF COMPLAINT Patient presents for Retina Follow Up   HISTORY OF PRESENT ILLNESS: Jeanne Parker is a 58 y.o. female who presents to the clinic today for:   HPI    Retina Follow Up    Patient presents with  Other.  In both eyes.  This started 1 month ago.  Severity is moderate.  Duration of 4 days.  Since onset it is gradually improving.  I, the attending physician,  performed the HPI with the patient and updated documentation appropriately.          Comments    58 y/o female pt here for 4 day f/u for lattice degeneration c/atrophic holes OU and touch-up laser OU.  No noticed change in [place] OU.  OD still a bit blurred due to corneal epithelial defect.  Has a few floaters OU.  Denies pain, flashes.  Restasis bid OU Ofloxacin QID OD Timolol QD OD       Last edited by Bernarda Caffey, MD on 04/30/2019 11:46 AM. (History)       Referring physician: Marygrace Drought, MD [place], [place] [address]  HISTORICAL INFORMATION:   Selected notes from the MEDICAL RECORD NUMBER Referred by Dr. Wyatt Portela for concern of iritis, operculated hole LEE: 05.11.20 (S. Groat)   Ocular Hx- PMH-anxiety, breast cancer, hypothyroid    CURRENT MEDICATIONS: Current Outpatient Medications (Ophthalmic Drugs)  Medication Sig  . cycloSPORINE (RESTASIS) 0.05 % ophthalmic emulsion Place into both eyes 2 (two) times daily.   Marland Kitchen ofloxacin (OCUFLOX) 0.3 % ophthalmic solution Place 1 drop into the right eye 4 (four) times daily for 10 days.  . prednisoLONE acetate (PRED FORTE) 1 % ophthalmic suspension Place 1 drop into the right eye 4 (four) times daily.   . timolol (TIMOPTIC) 0.25 % ophthalmic solution INSTILL 1 DROP INTO RIGHT EYE EVERY MORNING   No current facility-administered medications for this visit.  (Ophthalmic Drugs)   Current Outpatient Medications (Other)  Medication Sig  . anastrozole (ARIMIDEX) 1 MG tablet Take 1  tablet (1 mg total) by mouth daily.  . citalopram (CELEXA) 20 MG tablet Take 1 tablet (20 mg total) by mouth daily.  . eszopiclone (LUNESTA) 2 MG TABS tablet TAKE 1 TABLET BY MOUTH AT  BEDTIME AS NEEDED FOR SLEEP IMMEDIATELY BEFORE BEDTIME.  . fluticasone (FLONASE) 50 MCG/ACT nasal spray fluticasone propionate 50 mcg/actuation nasal spray,suspension  . levothyroxine (SYNTHROID) 75 MCG tablet TAKE 1 TABLET BY MOUTH  DAILY BEFORE BREAKFAST  . Multiple Vitamin (MULTIVITAMIN) capsule Take 1 capsule by mouth daily.  Marland Kitchen tretinoin (RETIN-A) 0.025 % cream Apply topically at bedtime.  . valACYclovir (VALTREX) 500 MG tablet    No current facility-administered medications for this visit.  (Other)      REVIEW OF SYSTEMS: ROS    Positive for: Eyes   Negative for: Constitutional, Gastrointestinal, Neurological, Skin, Genitourinary, Musculoskeletal, HENT, Endocrine, Cardiovascular, Respiratory, Psychiatric, Allergic/Imm, Heme/Lymph   Last edited by Matthew Folks, COA on 04/30/2019 10:11 AM. (History)       ALLERGIES No Known Allergies  PAST MEDICAL HISTORY Past Medical History:  Diagnosis Date  . Anxiety    situational  . 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 Cataract    OU  . History of chicken pox   . History of colon polyps   . Hypothyroid   . Personal history  of radiation therapy    Past Surgical History:  Procedure Laterality Date  . BREAST BIOPSY    . BREAST EXCISIONAL BIOPSY Left 1998  . BREAST LUMPECTOMY Right 2016  . BREAST SURGERY  1998   fibroid adenoma   . RADIOACTIVE SEED GUIDED PARTIAL MASTECTOMY WITH AXILLARY SENTINEL LYMPH NODE BIOPSY Right 11/16/2015   Procedure: RADIOACTIVE SEED GUIDED PARTIAL MASTECTOMY WITH AXILLARY SENTINEL LYMPH NODE BIOPSY;  Surgeon: Erroll Luna, MD;  Location: [facility];  Service: General;  Laterality: Right;    FAMILY HISTORY Family History  Problem Relation Age of Onset  . Colon  cancer Paternal Grandfather        died age 8  . Hypertension Mother 34  . Hyperlipidemia Mother   . Thyroid nodules Mother   . Diabetes Father 33  . Hyperlipidemia Father   . Hypertension Father   . GER disease Father   . Coronary artery disease Father        angioplasty age 50s  . Atrial fibrillation Father        on coumadin  . Gout Brother   . Thyroid cancer Sister     SOCIAL HISTORY Social History   Tobacco Use  . Smoking status: Never Smoker  . Smokeless tobacco: Never Used  Substance Use Topics  . Alcohol use: Yes    Comment: 1 to 2 glasses of wine a week   . Drug use: No         OPHTHALMIC EXAM:  Base Eye Exam    Visual Acuity (Snellen - Linear)      Right Left   Dist cc 20/30 20/25   Dist ph cc 20/30 +2 20/25 +   Correction:  Glasses       Tonometry (Tonopen, 10:17 AM)      Right Left   Pressure 15 15       Pupils      Dark Light Shape React APD   Right 6 5 Round Brisk None   Left 6 5 Round Brisk None       Visual Fields (Counting fingers)      Left Right    Full Full       Extraocular Movement      Right Left    Full, Ortho Full, Ortho       Neuro/Psych    Oriented x3:  Yes   Mood/Affect:  Normal       Dilation    Both eyes:  1.0% Mydriacyl, 2.5% Phenylephrine @ 10:17 AM  1 gtt phenyl 10% OU @ 10:28 a.m.        Slit Lamp and Fundus Exam    Slit Lamp Exam      Right Left   Lids/Lashes mild Telangiectasia mild Telangiectasia   Conjunctiva/Sclera White and quiet White and quiet   Cornea mild Arcus, 1+ Punctate epithelial erosions superiorly, linear epi defects superiorly, triangular epi defect at 0200 mild Arcus, 1+ Punctate epithelial erosions   Anterior Chamber deep, 0.5+cell/pigment deep and clear   Iris Round and well dilated Round and well dilated   Lens 2+ Nuclear sclerosis, 1-2+ Cortical cataract 2+ Nuclear sclerosis, 1-2+ Cortical cataract   Vitreous Vitreous syneresis, +pigment in anterior vitreous, Posterior  vitreous detachment Vitreous syneresis       Fundus Exam      Right Left   Disc Pink and Sharp, mild tilt Tilted disc, temporal Peripapillary atrophy, Pink and Sharp   C/D Ratio 0.5 0.55   Macula Flat,  Good foveal reflex, mild Retinal pigment epithelial mottling, mild IRF nasal macula Flat, Good foveal reflex, mild Retinal pigment epithelial mottling, No heme or edema   Vessels mild Vascular attenuation mild Vascular attenuation   Periphery Attached, superior lattice, atrophic hole at 1030 -- good laser surrounding, mild lattice with small hole at 1200 -- light laser changes, focal areas of pigment clumping at 0600 and 0700 Attached, pigmented lattice at 0100 -- light laser surrounding and 0430 -- good laser surrounding, pigmented lattice with atrophic hole and +SRF at [place] and Procedures for @TODAY @  LASER PROCEDURE NOTE  Procedure:     Touch-up barrier laser retinopexy using slit lamp laser, BOTH eyes  Diagnosis:      Lattice degeneration w/ atrophic holes, RIGHT eye             Patches of lattice:superiorly with holes at 1030 and 1200                       Lattice degeneration w/ atrophic hole + focal SRF/RD, LEFT eye             Patches of lattice:0100 superiorly; 0430-0500 inferiorly -- focal SRF/RD at 0500  Surgeon:        Bernarda Caffey, MD, PhD  Anesthesia:    Topical  Informed consent obtained, operative eye marked, and time out performed prior to initiation of laser.  Laser settings: Lumenis Smart532 laser, slit lamp Lens: [place] PRP 165 Power: 320-360 mW Spot size: 200 microns Duration: 30 msec # spots: 113 OD, 165 OS  Placement of laser: Using a [place] PRP 165 contact lens at the slit lamp, supplemental laser was placed in three confluent rows around patches of lattice w/ atrophic holes at 1200 OD, and 0100 OS.  Complications: None.  Patient tolerated the procedure well and received  written and verbal post-procedure care information/education.        ASSESSMENT/PLAN:    ICD-10-CM   1. Bilateral retinal lattice degeneration H35.413   2. Retinal hole of both eyes H33.323   3. 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 Retinal edema H35.81   6. Combined forms of age-related cataract of both eyes H25.813   7. 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 Myopia of both eyes H52.13   10. Corneal epithelial defect H18.30   11. Hypertensive retinopathy of both eyes H35.033   12. Dry eyes H04.123     1-3. Lattice degeneration w/ atrophic holes, OU; OS with focal SRF/RD  - OD: atrophic hole at 1030, mild lattice with small hole at 1200  - OS: pigmented lattice at 0100 and 0430-0500--atrophic hole and +SRF at 0500  - s/p laser retinopexy OS (05.11.20) -- light laser changes at 12, good at 1030  - s/p laser retinopexy OD (05.18.20) -- light laser changes at 1, good at 430-5  - recommend laser retinopexy fill-in OU -- pt wishes to proceed touch up laser OU today  - RBA of procedure discussed, questions answered  - informed consent obtained and signed  - see procedure note above  - restart PF QID OU x7 days  - f/u in 2-3 wks  4,5. Retinal edema, retinoschisis OD  - nasal IRF/cystic macular changes extending from inf temp disc -- +intraretinal stranding  - FA (05.18.20) without hyperfluorescence/leakage suggesting not CME or optic  pit   - stable from prior -- will continue to monitor  6. Mixed form age related cataract OU  - The symptoms of cataract, surgical options, and treatments and risks were discussed with patient.  - discussed diagnosis and progression  - not yet visually significant  - monitor for now  7,8. Iritis with ocular hypertension OD  - now under the expert management of Dr. Satira Sark at [facility]  - Iritis and IOP improved today  9. Myopia OU  - discussed association of myopia  with lattice degeneration and RT/RD  - monitor  10. Corneal Epithelial Defect OD  - unclear etiology --?inadvertent trauma while sleeping?  - minimal interval improvement  - cont ofloxican QID OD  - has scheduled f/u with Dr. Satira Sark next week   Ophthalmic Meds Ordered this visit:  No orders of the defined types were placed in this encounter.      Return for 2-3 wks, POV.  There are no Patient Instructions on file for this visit.   Explained the diagnoses, plan, and follow up with the patient and they expressed understanding.  Patient expressed understanding of the importance of proper follow up care.   This document serves as a record of services personally performed by Gardiner Sleeper, MD, PhD. It was created on their behalf by Ernest Mallick, OA, an ophthalmic assistant. The creation of this record is the provider's dictation and/or activities during the visit.    Electronically signed by: Ernest Mallick, OA  06.03.2020 12:05 PM     Gardiner Sleeper, M.D., Ph.D. Diseases & Surgery of the Retina and Vitreous [facility]   I have reviewed the above documentation for accuracy and completeness, and I agree with the above. Gardiner Sleeper, M.D., Ph.D. 04/30/19 12:05 PM    Abbreviations: M myopia (nearsighted); A astigmatism; H hyperopia (farsighted); P presbyopia; Mrx spectacle prescription;  CTL contact lenses; OD right eye; OS left eye; OU both eyes  XT exotropia; ET esotropia; PEK punctate epithelial keratitis; PEE punctate epithelial erosions; DES dry eye syndrome; MGD meibomian gland dysfunction; ATs artificial tears; PFAT's preservative free artificial tears; [place] nuclear sclerotic cataract; PSC posterior subcapsular cataract; ERM epi-retinal membrane; PVD posterior vitreous detachment; RD retinal detachment; DM diabetes mellitus; DR diabetic retinopathy; NPDR non-proliferative diabetic retinopathy; PDR proliferative diabetic retinopathy; CSME clinically  significant macular edema; DME diabetic macular edema; dbh dot blot hemorrhages; CWS cotton wool spot; POAG primary open angle glaucoma; C/D cup-to-disc ratio; HVF humphrey visual field; GVF goldmann visual field; OCT optical coherence tomography; IOP intraocular pressure; BRVO Branch retinal vein occlusion; CRVO central retinal vein occlusion; CRAO central retinal artery occlusion; BRAO branch retinal artery occlusion; RT retinal tear; SB scleral buckle; PPV pars plana vitrectomy; VH Vitreous hemorrhage; PRP panretinal laser photocoagulation; IVK intravitreal kenalog; VMT vitreomacular traction; MH Macular hole;  NVD neovascularization of the disc; NVE neovascularization elsewhere; AREDS age related eye disease study; ARMD age related macular degeneration; POAG primary open angle glaucoma; EBMD epithelial/anterior basement membrane dystrophy; ACIOL anterior chamber intraocular lens; IOL intraocular lens; PCIOL posterior chamber intraocular lens; Phaco/IOL phacoemulsification with intraocular lens placement; [place] photorefractive keratectomy; LASIK laser assisted in situ keratomileusis; HTN hypertension; DM diabetes mellitus; COPD chronic obstructive pulmonary disease

## 2019-04-30 ENCOUNTER — Encounter (INDEPENDENT_AMBULATORY_CARE_PROVIDER_SITE_OTHER): Payer: Self-pay | Admitting: Ophthalmology

## 2019-04-30 ENCOUNTER — Ambulatory Visit (INDEPENDENT_AMBULATORY_CARE_PROVIDER_SITE_OTHER): Payer: 59 | Admitting: Ophthalmology

## 2019-04-30 ENCOUNTER — Other Ambulatory Visit: Payer: Self-pay

## 2019-04-30 DIAGNOSIS — H1830 Unspecified corneal membrane change: Secondary | ICD-10-CM

## 2019-04-30 DIAGNOSIS — H5213 Myopia, bilateral: Secondary | ICD-10-CM

## 2019-04-30 DIAGNOSIS — H33101 Unspecified retinoschisis, right eye: Secondary | ICD-10-CM

## 2019-04-30 DIAGNOSIS — H33323 Round hole, bilateral: Secondary | ICD-10-CM

## 2019-04-30 DIAGNOSIS — H3322 Serous retinal detachment, left eye: Secondary | ICD-10-CM

## 2019-04-30 DIAGNOSIS — H35413 Lattice degeneration of retina, bilateral: Secondary | ICD-10-CM

## 2019-04-30 DIAGNOSIS — H35033 Hypertensive retinopathy, bilateral: Secondary | ICD-10-CM

## 2019-04-30 DIAGNOSIS — H40053 Ocular hypertension, bilateral: Secondary | ICD-10-CM

## 2019-04-30 DIAGNOSIS — H25813 Combined forms of age-related cataract, bilateral: Secondary | ICD-10-CM

## 2019-04-30 DIAGNOSIS — H04123 Dry eye syndrome of bilateral lacrimal glands: Secondary | ICD-10-CM

## 2019-04-30 DIAGNOSIS — H209 Unspecified iridocyclitis: Secondary | ICD-10-CM

## 2019-04-30 DIAGNOSIS — H3581 Retinal edema: Secondary | ICD-10-CM

## 2019-05-13 NOTE — Assessment & Plan Note (Signed)
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  Breast Cancer Surveillance: 1. Breast exam 08/18/2018: Benign 2. Mammogram  11/12/2019benign. Postsurgical changes. Breast Density Category C. 3. Bone density 07/29/17: T score -1.7  Emotional issues: She has done much better when she switched her to evening. Patient is in the process of cutting down and discontinuing Celexa. she got married in July 2019.  Return to clinicin oneyear.

## 2019-05-14 ENCOUNTER — Telehealth: Payer: Self-pay | Admitting: Hematology and Oncology

## 2019-05-14 NOTE — Telephone Encounter (Signed)
I talk with patient regarding video visit , she said awesome

## 2019-05-18 NOTE — Progress Notes (Signed)
[facility] Note  05/19/2019     CHIEF COMPLAINT Patient presents for Post-op Follow-up   HISTORY OF PRESENT ILLNESS: Jeanne Parker is a 58 y.o. female who presents to the clinic today for:   HPI    Post-op Follow-up    In both eyes.  Vision is stable.  I, the attending physician,  performed the HPI with the patient and updated documentation appropriately.          Comments    Patient states her vision is stable in both eyes.  Patient denies eye pain or discomfort.  Patient denies any new or worsening floaters or fol OU.       Last edited by Bernarda Caffey, MD on 05/19/2019 10:40 PM. (History)    pt states she had no problems after receiving laser in both eyes at last visit, pt states Dr. Satira Sark told her to decrease her timolol to once daily only, she states he took her off the antibiotic and virus pills, pt states Dr. Satira Sark advised her not to wear contact lenses, pt is on restasis and using hot compresses on her eyes twice a day   Referring physician: Marygrace Drought, MD [place],  [place] [address]  HISTORICAL INFORMATION:   Selected notes from the MEDICAL RECORD NUMBER Referred by Dr. Wyatt Portela for concern of iritis, operculated hole LEE: 05.11.20 (S. Groat)   Ocular Hx- PMH-anxiety, breast cancer, hypothyroid    CURRENT MEDICATIONS: Current Outpatient Medications (Ophthalmic Drugs)  Medication Sig  . cycloSPORINE (RESTASIS) 0.05 % ophthalmic emulsion Place into both eyes 2 (two) times daily.   . prednisoLONE acetate (PRED FORTE) 1 % ophthalmic suspension Place 1 drop into the right eye 4 (four) times daily.   . timolol (TIMOPTIC) 0.25 % ophthalmic solution INSTILL 1 DROP INTO RIGHT EYE EVERY MORNING   No current facility-administered medications for this visit.  (Ophthalmic Drugs)   Current Outpatient Medications (Other)  Medication Sig  . anastrozole (ARIMIDEX) 1 MG tablet Take 1 tablet (1 mg total) by mouth daily.  .  citalopram (CELEXA) 20 MG tablet Take 1 tablet (20 mg total) by mouth daily.  . eszopiclone (LUNESTA) 2 MG TABS tablet TAKE 1 TABLET BY MOUTH AT  BEDTIME AS NEEDED FOR SLEEP IMMEDIATELY BEFORE BEDTIME.  . fluticasone (FLONASE) 50 MCG/ACT nasal spray fluticasone propionate 50 mcg/actuation nasal spray,suspension  . levothyroxine (SYNTHROID) 75 MCG tablet TAKE 1 TABLET BY MOUTH  DAILY BEFORE BREAKFAST  . Multiple Vitamin (MULTIVITAMIN) capsule Take 1 capsule by mouth daily.  Marland Kitchen tretinoin (RETIN-A) 0.025 % cream Apply topically at bedtime.  . valACYclovir (VALTREX) 500 MG tablet    No current facility-administered medications for this visit.  (Other)      REVIEW OF SYSTEMS: ROS    Positive for: Eyes   Negative for: Constitutional, Gastrointestinal, Neurological, Skin, Genitourinary, Musculoskeletal, HENT, Endocrine, Cardiovascular, Respiratory, Psychiatric, Allergic/Imm, Heme/Lymph   Last edited by Doneen Poisson on 05/19/2019  3:00 PM. (History)       ALLERGIES No Known Allergies  PAST MEDICAL HISTORY Past Medical History:  Diagnosis Date  . Anxiety    situational  . 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 Cataract    OU  . History of chicken pox   . History of colon polyps   . Hypothyroid   . Personal history of radiation therapy    Past Surgical History:  Procedure Laterality Date  .  BREAST BIOPSY    . BREAST EXCISIONAL BIOPSY Left 1998  . BREAST LUMPECTOMY Right 2016  . BREAST SURGERY  1998   fibroid adenoma   . RADIOACTIVE SEED GUIDED PARTIAL MASTECTOMY WITH AXILLARY SENTINEL LYMPH NODE BIOPSY Right 11/16/2015   Procedure: RADIOACTIVE SEED GUIDED PARTIAL MASTECTOMY WITH AXILLARY SENTINEL LYMPH NODE BIOPSY;  Surgeon: Erroll Luna, MD;  Location: [facility];  Service: General;  Laterality: Right;    FAMILY HISTORY Family History  Problem Relation Age of Onset  . Colon cancer Paternal Grandfather        died age  57  . Hypertension Mother 68  . Hyperlipidemia Mother   . Thyroid nodules Mother   . Diabetes Father 35  . Hyperlipidemia Father   . Hypertension Father   . GER disease Father   . Coronary artery disease Father        angioplasty age 47s  . Atrial fibrillation Father        on coumadin  . Gout Brother   . Thyroid cancer Sister     SOCIAL HISTORY Social History   Tobacco Use  . Smoking status: Never Smoker  . Smokeless tobacco: Never Used  Substance Use Topics  . Alcohol use: Yes    Comment: 1 to 2 glasses of wine a week   . Drug use: No         OPHTHALMIC EXAM:  Base Eye Exam    Visual Acuity (Snellen - Linear)      Right Left   Dist cc 20/30 -1 20/25 -2   Dist ph cc NI 20/20 -3       Tonometry (Tonopen, 3:02 PM)      Right Left   Pressure 19 18       Pupils      Dark Light Shape React APD   Right 6 5 Round Brisk 0   Left 6 5 Round Brisk 0       Extraocular Movement      Right Left    Full Full       Neuro/Psych    Oriented x3: Yes   Mood/Affect: Normal       Dilation    Both eyes: 1.0% Mydriacyl, 2.5% Phenylephrine @ 3:02 PM        Slit Lamp and Fundus Exam    Slit Lamp Exam      Right Left   Lids/Lashes mild Telangiectasia mild Telangiectasia   Conjunctiva/Sclera White and quiet White and quiet   Cornea mild Arcus, trace Punctate epithelial erosions superiorly, EBMD, no epi defect mild Arcus, 1+ Punctate epithelial erosions   Anterior Chamber Deep and quiet deep and clear   Iris Round and well dilated Round and well dilated   Lens 2+ Nuclear sclerosis, 1-2+ Cortical cataract 2+ Nuclear sclerosis, 1-2+ Cortical cataract   Vitreous Vitreous syneresis, +pigment in anterior vitreous, Posterior vitreous detachment Vitreous syneresis       Fundus Exam      Right Left   Disc Pink and Sharp, mild tilt Tilted disc, temporal Peripapillary atrophy, Pink and Sharp   C/D Ratio 0.5 0.55   Macula Flat, Good foveal reflex, mild Retinal pigment  epithelial mottling, mild IRF nasal macula Flat, Good foveal reflex, mild Retinal pigment epithelial mottling, No heme or edema   Vessels mild Vascular attenuation mild Vascular attenuation   Periphery Attached, superior lattice w/ atrophic holes at 1030 and 1200 -- good laser surrounding; focal areas of pigment clumping at 0600 and  0700 Attached, pigmented lattice at 0100 and 0430-0600; atrophic hole w/ mild cuff of SRF at 0500 -- good laser surrounding all lesions        Refraction    Wearing Rx      Sphere Cylinder Axis   Right -5.25 +0.50 130   Left -5.50 +0.50 117          IMAGING AND PROCEDURES  Imaging and Procedures for @TODAY @  OCT, Retina - OU - Both Eyes       Right Eye Quality was good. Central Foveal Thickness: 257. Progression has been stable. Findings include abnormal foveal contour, intraretinal fluid, no SRF (Focal IRF eminating from IT disc, but remains extrafoveal, intra-retinal stranding -- suspect retinoschisis).   Left Eye Quality was good. Central Foveal Thickness: 268. Progression has been stable. Findings include normal foveal contour, no SRF, no IRF (Partial PVD).   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 CME - cystoid macular edema. PED - pigment epithelial detachment. IRF - intraretinal fluid. SRF - subretinal fluid. EZ - ellipsoid zone. ERM - epiretinal membrane. ORA - outer retinal atrophy. ORT - outer retinal tubulation. SRHM - subretinal hyper-reflective material               ASSESSMENT/PLAN:    ICD-10-CM   1. Bilateral retinal lattice degeneration  H35.413   2. Retinal hole of both eyes  H33.323   3. 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  Retinal edema  H35.81 OCT, Retina - OU - Both Eyes  6. Combined forms of age-related cataract of both eyes  H25.813   7. Iritis of right eye  H20.9   8. 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 Myopia of both eyes  H52.13   10. Corneal epithelial defect  H18.30   11. Hypertensive retinopathy of both eyes  H35.033   12. Dry eyes  H04.123     1-3. Lattice degeneration w/ atrophic holes, OU; OS with focal SRF/RD  - OD: atrophic hole at 1030, mild lattice with small hole at 1200  - OS: pigmented lattice at 0100 and 0430-0500--atrophic hole and +SRF at 0500  - s/p laser retinopexy OS (05.11.20), fill-in (06.05.20) -- good laser changes surrounding all lesions  - s/p laser retinopexy OD (05.18.20), fill-in (06.05.20) -- good laser changes surrounding all lesions  - completed PF QID OU x7 days  - f/u 3 months  4,5. Retinal edema, retinoschisis OD  - nasal IRF/cystic macular changes extending from inf temp disc -- +intraretinal stranding  - FA (05.18.20) without hyperfluorescence/leakage suggesting not CME or optic pit   - stable from prior -- will continue to monitor  - f/u 3 mos  6. Mixed form age related cataract OU  - The symptoms of cataract, surgical options, and treatments and risks were discussed with patient.  - discussed diagnosis and progression  - not yet visually significant  - monitor for now  7,8. Iritis with ocular hypertension OD  - now under the expert management of Dr. Satira Sark at [facility]  - Iritis and IOP improved today  9. Myopia OU  - discussed association of myopia with lattice degeneration and RT/RD  - monitor  10. Corneal Epithelial Defect  OD  - unclear etiology --?inadvertent trauma while sleeping?  - now resolved   Ophthalmic Meds Ordered this visit:  No orders of the defined types were placed in this encounter.      Return in about 3 months (around 08/19/2019) for f/u retinoscisis, DFE, OCT.  There are no Patient Instructions on  file for this visit.   Explained the diagnoses, plan, and follow up with the patient and they expressed understanding.  Patient expressed understanding of the importance of proper follow up care.   This document serves as a record of services personally performed by Gardiner Sleeper, MD, PhD. It was created on their behalf by Ernest Mallick, OA, an ophthalmic assistant. The creation of this record is the provider's dictation and/or activities during the visit.    Electronically signed by: Ernest Mallick, OA  06.23.2020 10:49 PM    Gardiner Sleeper, M.D., Ph.D. Diseases & Surgery of the Retina and Vitreous [facility]  I have reviewed the above documentation for accuracy and completeness, and I agree with the above. Gardiner Sleeper, M.D., Ph.D. 05/19/19 10:52 PM     Abbreviations: M myopia (nearsighted); A astigmatism; H hyperopia (farsighted); P presbyopia; Mrx spectacle prescription;  CTL contact lenses; OD right eye; OS left eye; OU both eyes  XT exotropia; ET esotropia; PEK punctate epithelial keratitis; PEE punctate epithelial erosions; DES dry eye syndrome; MGD meibomian gland dysfunction; ATs artificial tears; PFAT's preservative free artificial tears; [place] nuclear sclerotic cataract; PSC posterior subcapsular cataract; ERM epi-retinal membrane; PVD posterior vitreous detachment; RD retinal detachment; DM diabetes mellitus; DR diabetic retinopathy; NPDR non-proliferative diabetic retinopathy; PDR proliferative diabetic retinopathy; CSME clinically significant macular edema; DME diabetic macular edema; dbh dot blot hemorrhages; CWS cotton wool spot; POAG primary open angle glaucoma; C/D cup-to-disc ratio; HVF humphrey visual field; GVF goldmann visual field; OCT optical coherence tomography; IOP intraocular pressure; BRVO Branch retinal vein occlusion; CRVO central retinal vein occlusion; CRAO central retinal artery occlusion; BRAO branch retinal artery occlusion; RT retinal  tear; SB scleral buckle; PPV pars plana vitrectomy; VH Vitreous hemorrhage; PRP panretinal laser photocoagulation; IVK intravitreal kenalog; VMT vitreomacular traction; MH Macular hole;  NVD neovascularization of the disc; NVE neovascularization elsewhere; AREDS age related eye disease study; ARMD age related macular degeneration; POAG primary open angle glaucoma; EBMD epithelial/anterior basement membrane dystrophy; ACIOL anterior chamber intraocular lens; IOL intraocular lens; PCIOL posterior chamber intraocular lens; Phaco/IOL phacoemulsification with intraocular lens placement; [place] photorefractive keratectomy; LASIK laser assisted in situ keratomileusis; HTN hypertension; DM diabetes mellitus; COPD chronic obstructive pulmonary disease

## 2019-05-19 ENCOUNTER — Telehealth: Payer: Self-pay | Admitting: Hematology and Oncology

## 2019-05-19 ENCOUNTER — Ambulatory Visit (INDEPENDENT_AMBULATORY_CARE_PROVIDER_SITE_OTHER): Payer: 59 | Admitting: Ophthalmology

## 2019-05-19 ENCOUNTER — Encounter (INDEPENDENT_AMBULATORY_CARE_PROVIDER_SITE_OTHER): Payer: Self-pay | Admitting: Ophthalmology

## 2019-05-19 ENCOUNTER — Other Ambulatory Visit: Payer: Self-pay

## 2019-05-19 DIAGNOSIS — H1830 Unspecified corneal membrane change: Secondary | ICD-10-CM

## 2019-05-19 DIAGNOSIS — H3322 Serous retinal detachment, left eye: Secondary | ICD-10-CM

## 2019-05-19 DIAGNOSIS — H33101 Unspecified retinoschisis, right eye: Secondary | ICD-10-CM

## 2019-05-19 DIAGNOSIS — H209 Unspecified iridocyclitis: Secondary | ICD-10-CM

## 2019-05-19 DIAGNOSIS — H3581 Retinal edema: Secondary | ICD-10-CM

## 2019-05-19 DIAGNOSIS — H25813 Combined forms of age-related cataract, bilateral: Secondary | ICD-10-CM

## 2019-05-19 DIAGNOSIS — H35413 Lattice degeneration of retina, bilateral: Secondary | ICD-10-CM

## 2019-05-19 DIAGNOSIS — H40053 Ocular hypertension, bilateral: Secondary | ICD-10-CM

## 2019-05-19 DIAGNOSIS — H5213 Myopia, bilateral: Secondary | ICD-10-CM

## 2019-05-19 DIAGNOSIS — H33323 Round hole, bilateral: Secondary | ICD-10-CM

## 2019-05-19 NOTE — Progress Notes (Signed)
HEMATOLOGY-ONCOLOGY MYCHART VIDEO VISIT PROGRESS NOTE  I connected with Jeanne Parker on 05/20/2019 at  9:15 AM EDT by MyChart video conference and verified that I am speaking with the correct person using two identifiers.  I discussed the limitations, risks, security and privacy concerns of performing an evaluation and management service by MyChart and the availability of in person appointments.  I also discussed with the patient that there may be a patient responsible charge related to this service. The patient expressed understanding and agreed to proceed.  Patient's Location: Home Physician Location: Clinic  CHIEF COMPLIANT: Follow-up of anastrozole   INTERVAL HISTORY: Jeanne Parker is a 58 y.o. female with above-mentioned history of right breast cancer who underwent lumpectomy, radiation, and is currently on oral antiestrogen therapy with anastrozole, after she could not tolerate tamoxifen. I last saw her 9 months ago. She presents over MyChart for annual follow-up.   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 ER 95%, PR 80%, HER-2 negative ratio 1.15, Ki-67 3%   10/11/2015 Clinical Stage   Stage IA: T1c N0   11/16/2015 Surgery   Right lumpectomy: Invasive ductal carcinoma 2.7 cm with DCIS, grade 1, left, LCIS, 0/4 lymph nodes negative   11/16/2015 Pathologic Stage   Stage IIA: T2 N0   11/16/2015 Oncotype testing   RS 11 (7% ROR)   01/02/2016 - 01/29/2016 Radiation Therapy   Adjuvant XRT   02/24/2016 -  Anti-estrogen oral therapy   Tamoxifen 20 mg daily switched to letrozole 2.5 mg daily   03/01/2016 Survivorship   Survivorship visit completed    REVIEW OF SYSTEMS:   Constitutional: Denies fevers,  chills or abnormal weight loss Eyes: Denies blurriness of vision Ears, nose, mouth, throat, and face: Denies mucositis or sore throat Respiratory: Denies cough, dyspnea or wheezes Cardiovascular: Denies palpitation, chest discomfort Gastrointestinal:  Denies nausea, heartburn or change in bowel habits Skin: Denies abnormal skin rashes Lymphatics: Denies new lymphadenopathy or easy bruising Neurological:Denies numbness, tingling or new weaknesses Behavioral/Psych: Mood is stable, no new changes  Extremities: No lower extremity edema Breast: denies any pain or lumps or nodules in either breasts All other systems were reviewed with the patient and are negative.  Observations/Objective:  There were no vitals filed for this visit. There is no height or weight on file to calculate BMI.  I have reviewed the data as listed CMP Latest Ref Rng & Units 12/02/2018 08/18/2018 11/07/2017  Glucose 70 - 99 mg/dL 98 183(H) 95  BUN 6 - 23 mg/dL _0 Creatinine 0.40 - 1.20 mg/dL 0.72 0.80 0.74  Sodium 135 - 145 mEq/L 137 137 140  Potassium 3.5 - 5.1 mEq/L 4.1 4.1 4.2  Chloride 96 - 112 mEq/L 102 100 103  CO2 19 - 32 mEq/L _1 Calcium 8.4 - 10.5 mg/dL 9.9 9.9 9.7  Total Protein 6.0 - 8.3 g/dL 7.0 7.2 7.0  Total Bilirubin 0.2 - 1.2 mg/dL 0.5 0.3 0.5  Alkaline Phos 39 - 117 U/L 39 57 38(L)  AST 0 - 37 U/L _2 ALT 0 - 35 U/L _3 Lab Results  Component Value Date   WBC 3.3 (L) 12/02/2018  HGB 13.3 12/02/2018   HCT 40.2 12/02/2018   MCV 92.7 12/02/2018   PLT 287.0 12/02/2018   NEUTROABS 1.5 12/02/2018    Assessment Plan:  Breast cancer of upper-inner quadrant of right female breast ([place]) Right lumpectomy 11/16/2015: Invasive ductal carcinoma 2.7 cm with DCIS, grade 1, left, LCIS, 0/4 lymph nodes negative, T2 N0 stage II a, ER 95%, PR 80%, HER-2 negative, Ki-67 3%, Oncotype Dx 11 (7% ROR) Adj XRT completed 01/29/16  Treatment: Anti-estrogen therapy with tamoxifen 20 mg  daily started 02/24/2016 Switched to anastrozole 05/19/2017 when she became menopausal  Anastrozole toxicities: Tolerating it extremely well without any hot flashes or myalgias.  Breast Cancer Surveillance: 1. Breast exam 08/18/2018: Benign 2. Mammogram  11/12/2019benign. Postsurgical changes. Breast Density Category C. 3. Bone density 07/29/17: T score -1.7  Emotional issues: Patient is in the process of cutting down and discontinuing Celexa. She will try to half the dose. she got married in July 2019.  Return to clinicin oneyear.    I discussed the assessment and treatment plan with the patient. The patient was provided an opportunity to ask questions and all were answered. The patient agreed with the plan and demonstrated an understanding of the instructions. The patient was advised to call back or seek an in-person evaluation if the symptoms worsen or if the condition fails to improve as anticipated.   I provided 15 minutes of face-to-face MyChart video visit time during this encounter.    Rulon Eisenmenger, MD 05/20/2019   I, Molly Dorshimer, am acting as scribe for Nicholas Lose, MD.  I have reviewed the above documentation for accuracy and completeness, and I agree with the above.

## 2019-05-19 NOTE — Telephone Encounter (Signed)
Contacted patient to verify mychart visit for pre reg 

## 2019-05-20 ENCOUNTER — Inpatient Hospital Stay: Payer: 59 | Attending: Hematology and Oncology | Admitting: Hematology and Oncology

## 2019-05-20 DIAGNOSIS — Z170 Estrogen receptor positive status [ER+]: Secondary | ICD-10-CM | POA: Diagnosis not present

## 2019-05-20 DIAGNOSIS — C50211 Malignant neoplasm of upper-inner quadrant of right female breast: Secondary | ICD-10-CM | POA: Diagnosis not present

## 2019-05-20 DIAGNOSIS — Z79810 Long term (current) use of selective estrogen receptor modulators (SERMs): Secondary | ICD-10-CM | POA: Diagnosis not present

## 2019-05-20 DIAGNOSIS — Z923 Personal history of irradiation: Secondary | ICD-10-CM

## 2019-05-20 MED ORDER — ANASTROZOLE 1 MG PO TABS: 1.0000 mg | ORAL_TABLET | Freq: Every day | ORAL | 3 refills | Status: DC

## 2019-07-22 ENCOUNTER — Other Ambulatory Visit: Payer: Self-pay

## 2019-07-22 ENCOUNTER — Ambulatory Visit (INDEPENDENT_AMBULATORY_CARE_PROVIDER_SITE_OTHER): Payer: 59

## 2019-07-22 DIAGNOSIS — Z23 Encounter for immunization: Secondary | ICD-10-CM

## 2019-08-17 NOTE — Progress Notes (Addendum)
[facility] Note  08/20/2019     CHIEF COMPLAINT Patient presents for Retina Follow Up   HISTORY OF PRESENT ILLNESS: Jeanne Parker is a 58 y.o. female who presents to the clinic today for:   HPI    Retina Follow Up    Patient presents with  Other.  In right eye.  This started 3 months ago.  Severity is moderate.  I, the attending physician,  performed the HPI with the patient and updated documentation appropriately.          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 Patient states vision doing good. No eye pain.       Last edited by Bernarda Caffey, MD on 08/20/2019  1:31 PM. (History)    pt states she feels she is doing well, she is using restasis twice a day, she will see Dr. Satira Sark by the end of the year   Referring physician: Marygrace Drought, MD [place],  [place] [address]  HISTORICAL INFORMATION:   Selected notes from the MEDICAL RECORD NUMBER Referred by Dr. Wyatt Portela for concern of iritis, operculated hole LEE: 05.11.20 (S. Groat)   Ocular Hx- PMH-anxiety, breast cancer, hypothyroid    CURRENT MEDICATIONS: Current Outpatient Medications (Ophthalmic Drugs)  Medication Sig  . cycloSPORINE (RESTASIS) 0.05 % ophthalmic emulsion Place into both eyes 2 (two) times daily.   . prednisoLONE acetate (PRED FORTE) 1 % ophthalmic suspension Place 1 drop into the right eye 4 (four) times daily.   . timolol (TIMOPTIC) 0.25 % ophthalmic solution INSTILL 1 DROP INTO RIGHT EYE EVERY MORNING   No current facility-administered medications for this visit.  (Ophthalmic Drugs)   Current Outpatient Medications (Other)  Medication Sig  . anastrozole (ARIMIDEX) 1 MG tablet Take 1 tablet (1 mg total) by mouth daily.  . citalopram (CELEXA) 20 MG tablet Take 1 tablet (20 mg total) by mouth daily.  . eszopiclone (LUNESTA) 2 MG TABS tablet TAKE 1 TABLET BY MOUTH AT  BEDTIME AS NEEDED FOR SLEEP IMMEDIATELY BEFORE  BEDTIME.  . fluticasone (FLONASE) 50 MCG/ACT nasal spray fluticasone propionate 50 mcg/actuation nasal spray,suspension  . levothyroxine (SYNTHROID) 75 MCG tablet TAKE 1 TABLET BY MOUTH  DAILY BEFORE BREAKFAST  . Multiple Vitamin (MULTIVITAMIN) capsule Take 1 capsule by mouth daily.  Marland Kitchen tretinoin (RETIN-A) 0.025 % cream Apply topically at bedtime.  . valACYclovir (VALTREX) 500 MG tablet    No current facility-administered medications for this visit.  (Other)      REVIEW OF SYSTEMS: ROS    Positive for: Eyes   Negative for: Constitutional, Gastrointestinal, Neurological, Skin, Genitourinary, Musculoskeletal, HENT, Endocrine, Cardiovascular, Respiratory, Psychiatric, Allergic/Imm, Heme/Lymph   Last edited by Theodore Demark, COA on 08/20/2019  1:10 PM. (History)       ALLERGIES No Known Allergies  PAST MEDICAL HISTORY Past Medical History:  Diagnosis Date  . Anxiety    situational  . 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 Cataract    OU  . History of chicken pox   . History of colon polyps   . Hypothyroid   . Personal history of radiation therapy    Past Surgical History:  Procedure Laterality Date  . BREAST BIOPSY    . BREAST EXCISIONAL BIOPSY Left 1998  . BREAST LUMPECTOMY Right 2016  . BREAST SURGERY  1998   fibroid adenoma   .  RADIOACTIVE SEED GUIDED PARTIAL MASTECTOMY WITH AXILLARY SENTINEL LYMPH NODE BIOPSY Right 11/16/2015   Procedure: RADIOACTIVE SEED GUIDED PARTIAL MASTECTOMY WITH AXILLARY SENTINEL LYMPH NODE BIOPSY;  Surgeon: Erroll Luna, MD;  Location: [facility];  Service: General;  Laterality: Right;    FAMILY HISTORY Family History  Problem Relation Age of Onset  . Colon cancer Paternal Grandfather        died age 50  . Hypertension Mother 77  . Hyperlipidemia Mother   . Thyroid nodules Mother   . Diabetes Father 57  . Hyperlipidemia Father   . Hypertension Father   . GER disease Father   .  Coronary artery disease Father        angioplasty age 4s  . Atrial fibrillation Father        on coumadin  . Gout Brother   . Thyroid cancer Sister     SOCIAL HISTORY Social History   Tobacco Use  . Smoking status: Never Smoker  . Smokeless tobacco: Never Used  Substance Use Topics  . Alcohol use: Yes    Comment: 1 to 2 glasses of wine a week   . Drug use: No         OPHTHALMIC EXAM:  Base Eye Exam    Visual Acuity (Snellen - Linear)      Right Left   Dist cc 20/30 -2 20/40 -2   Dist ph cc NI 20/20 -2   Correction: Glasses       Tonometry (Tonopen, 1:07 PM)      Right Left   Pressure 15 15       Pupils      Dark Light Shape React APD   Right 6 5 Round Brisk None   Left 6 5 Round Brisk None       Visual Fields (Counting fingers)      Left Right    Full Full       Extraocular Movement      Right Left    Full, Ortho Full, Ortho       Neuro/Psych    Oriented x3: Yes   Mood/Affect: Normal       Dilation    Both eyes: 1.0% Mydriacyl, 2.5% Phenylephrine @ 1:07 PM        Slit Lamp and Fundus Exam    Slit Lamp Exam      Right Left   Lids/Lashes mild Telangiectasia mild Telangiectasia   Conjunctiva/Sclera White and quiet White and quiet   Cornea mild Arcus, trace Punctate epithelial erosions superiorly mild Arcus, 1+ Punctate epithelial erosions   Anterior Chamber Deep and quiet deep and clear   Iris Round and well dilated Round and well dilated   Lens 2+ Nuclear sclerosis, 2+ Cortical cataract 2+ Nuclear sclerosis, 2+ Cortical cataract   Vitreous Vitreous syneresis, +pigment in anterior vitreous, Posterior vitreous detachment Vitreous syneresis       Fundus Exam      Right Left   Disc Pink and Sharp, mild tilt, Peripapillary atrophy Tilted disc, temporal Peripapillary atrophy, Pink and Sharp, Compact   C/D Ratio 0.5 0.55   Macula Flat, Good foveal reflex, mild Retinal pigment epithelial mottling, shallow cystic changes / retinoschisis nasal  macula Flat, blunted foveal reflex, mild Retinal pigment epithelial mottling, No heme or edema   Vessels mild Vascular attenuation mild Vascular attenuation   Periphery Attached, superior lattice w/ atrophic holes at 1030 and 1200 -- good laser surrounding; focal areas of pigment clumping at 0600 and  0700 Attached, pigmented lattice at 0100 and 0430-0600; atrophic hole w/ mild cuff of SRF at 0500 -- good laser surrounding all lesions        Refraction    Wearing Rx      Sphere Cylinder Axis   Right -5.25 +0.50 130   Left -5.50 +0.50 117          IMAGING AND PROCEDURES  Imaging and Procedures for @TODAY @  OCT, Retina - OU - Both Eyes       Right Eye Quality was good. Central Foveal Thickness: 248. Progression has improved. Findings include abnormal foveal contour, intraretinal fluid, no SRF, myopic contour (Interval improvement in nasal retinoschisis with inter-retinal stranding; shallowing of schisis but trace posterior extension remains extra foveal; shallow schisis SN to disc caught on widefield).   Left Eye Quality was good. Central Foveal Thickness: 264. Progression has been stable. Findings include normal foveal contour, no SRF, no IRF, myopic contour (Partial PVD).   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 CME - cystoid macular edema. PED - pigment epithelial detachment. IRF - intraretinal fluid. SRF - subretinal fluid. EZ - ellipsoid zone. ERM - epiretinal membrane. ORA - outer retinal atrophy. ORT - outer retinal tubulation. SRHM - subretinal hyper-reflective material               ASSESSMENT/PLAN:    ICD-10-CM   1. Bilateral retinal lattice degeneration  H35.413   2. Retinal hole of both eyes  H33.323   3. Left retinal detachment   H33.22   4. Right retinoschisis  H33.101   5. Retinal edema  H35.81 OCT, Retina - OU - Both Eyes  6. Combined forms of age-related cataract of both eyes  H25.813   7. Myopia of both eyes  H52.13     1-3. Lattice degeneration w/ atrophic holes, OU; OS with focal SRF/RD  - OD: atrophic hole at 1030, mild lattice with small hole at 1200  - OS: pigmented lattice at 0100 and 0430-0500--atrophic hole and +SRF at 0500  - s/p laser retinopexy OS (05.11.20), fill-in (06.05.20) -- good laser changes surrounding all lesions  - s/p laser retinopexy OD (05.18.20), fill-in (06.05.20) -- good laser changes surrounding all lesions  - f/u 6 months  4,5. Retinal edema, myopic retinoschisis OD  - nasal IRF/cystic macular changes extending from inf temp disc -- +intraretinal stranding  - FA (05.18.20) without hyperfluorescence/leakage suggesting not CME or optic pit -- myopic retinoschisis  - stable to slightly improved from prior on OCT -- will continue to monitor  - f/u 6 months  6. Mixed form age related cataract OU  - The symptoms of cataract, surgical options, and treatments and risks were discussed with patient.  - discussed diagnosis and progression  - not yet visually significant  - monitor for now  7. Myopia OU  - discussed association of myopia with lattice degeneration and RT/RD and retinoschisis  - monitor    Ophthalmic Meds Ordered this visit:  No orders of the defined types were placed in this encounter.      Return in about 6 months (around 02/17/2020) for f/u retinoschisis OD, DFE, OCT.  There are no Patient Instructions on file for this visit.   Explained the diagnoses, plan, and follow up with the patient and they  expressed understanding.  Patient expressed understanding of the importance of proper follow up care.   This document serves as a record of services personally performed by Gardiner Sleeper, MD, PhD. It was created on their behalf by Roselee Nova, COMT. The creation  of this record is the provider's dictation and/or activities during the visit.  Electronically signed by: Roselee Nova, COMT 08/21/19 4:11 PM  Gardiner Sleeper, M.D., Ph.D. Diseases & Surgery of the Retina and Vitreous [facility]   I have reviewed the above documentation for accuracy and completeness, and I agree with the above. Gardiner Sleeper, M.D., Ph.D. 08/21/19 4:11 PM   Abbreviations: M myopia (nearsighted); A astigmatism; H hyperopia (farsighted); P presbyopia; Mrx spectacle prescription;  CTL contact lenses; OD right eye; OS left eye; OU both eyes  XT exotropia; ET esotropia; PEK punctate epithelial keratitis; PEE punctate epithelial erosions; DES dry eye syndrome; MGD meibomian gland dysfunction; ATs artificial tears; PFAT's preservative free artificial tears; [place] nuclear sclerotic cataract; PSC posterior subcapsular cataract; ERM epi-retinal membrane; PVD posterior vitreous detachment; RD retinal detachment; DM diabetes mellitus; DR diabetic retinopathy; NPDR non-proliferative diabetic retinopathy; PDR proliferative diabetic retinopathy; CSME clinically significant macular edema; DME diabetic macular edema; dbh dot blot hemorrhages; CWS cotton wool spot; POAG primary open angle glaucoma; C/D cup-to-disc ratio; HVF humphrey visual field; GVF goldmann visual field; OCT optical coherence tomography; IOP intraocular pressure; BRVO Branch retinal vein occlusion; CRVO central retinal vein occlusion; CRAO central retinal artery occlusion; BRAO branch retinal artery occlusion; RT retinal tear; SB scleral buckle; PPV pars plana vitrectomy; VH Vitreous hemorrhage; PRP panretinal laser photocoagulation; IVK intravitreal kenalog; VMT vitreomacular traction; MH Macular hole;  NVD neovascularization of the disc; NVE neovascularization elsewhere; AREDS age related eye disease study; ARMD age related macular degeneration; POAG primary open angle glaucoma; EBMD epithelial/anterior  basement membrane dystrophy; ACIOL anterior chamber intraocular lens; IOL intraocular lens; PCIOL posterior chamber intraocular lens; Phaco/IOL phacoemulsification with intraocular lens placement; [place] photorefractive keratectomy; LASIK laser assisted in situ keratomileusis; HTN hypertension; DM diabetes mellitus; COPD chronic obstructive pulmonary disease

## 2019-08-20 ENCOUNTER — Encounter (INDEPENDENT_AMBULATORY_CARE_PROVIDER_SITE_OTHER): Payer: Self-pay | Admitting: Ophthalmology

## 2019-08-20 ENCOUNTER — Ambulatory Visit (INDEPENDENT_AMBULATORY_CARE_PROVIDER_SITE_OTHER): Payer: 59 | Admitting: Ophthalmology

## 2019-08-20 ENCOUNTER — Other Ambulatory Visit: Payer: Self-pay

## 2019-08-20 DIAGNOSIS — H3581 Retinal edema: Secondary | ICD-10-CM | POA: Diagnosis not present

## 2019-08-20 DIAGNOSIS — H3322 Serous retinal detachment, left eye: Secondary | ICD-10-CM | POA: Diagnosis not present

## 2019-08-20 DIAGNOSIS — H5213 Myopia, bilateral: Secondary | ICD-10-CM

## 2019-08-20 DIAGNOSIS — H33101 Unspecified retinoschisis, right eye: Secondary | ICD-10-CM

## 2019-08-20 DIAGNOSIS — H35413 Lattice degeneration of retina, bilateral: Secondary | ICD-10-CM

## 2019-08-20 DIAGNOSIS — H25813 Combined forms of age-related cataract, bilateral: Secondary | ICD-10-CM

## 2019-08-20 DIAGNOSIS — H33323 Round hole, bilateral: Secondary | ICD-10-CM | POA: Diagnosis not present

## 2019-08-21 ENCOUNTER — Encounter (INDEPENDENT_AMBULATORY_CARE_PROVIDER_SITE_OTHER): Payer: Self-pay | Admitting: Ophthalmology

## 2019-10-07 ENCOUNTER — Other Ambulatory Visit: Payer: Self-pay | Admitting: Hematology and Oncology

## 2019-10-07 ENCOUNTER — Other Ambulatory Visit: Payer: Self-pay | Admitting: Internal Medicine

## 2019-10-07 DIAGNOSIS — Z853 Personal history of malignant neoplasm of breast: Secondary | ICD-10-CM

## 2019-10-07 DIAGNOSIS — Z9889 Other specified postprocedural states: Secondary | ICD-10-CM

## 2019-10-15 ENCOUNTER — Other Ambulatory Visit: Payer: Self-pay

## 2019-10-15 ENCOUNTER — Ambulatory Visit
Admission: RE | Admit: 2019-10-15 | Discharge: 2019-10-15 | Disposition: A | Discharge status: Home / Self Care | Payer: 59 | Source: Ambulatory Visit | Attending: Hematology and Oncology | Admitting: Hematology and Oncology

## 2019-10-15 DIAGNOSIS — Z853 Personal history of malignant neoplasm of breast: Secondary | ICD-10-CM

## 2019-10-15 DIAGNOSIS — Z9889 Other specified postprocedural states: Secondary | ICD-10-CM

## 2019-11-12 ENCOUNTER — Encounter: Payer: Self-pay | Admitting: Internal Medicine

## 2019-12-09 NOTE — Progress Notes (Signed)
Subjective:    Patient ID: Jeanne Parker, female    DOB: [birth year], 60 y.o.   MRN: [id number]  HPI She is here for a physical exam.   She had some eye issues over the past year.  Initially I thought she had glaucoma, but it ended up being a viral infection.  And going to the eye doctor she found out that she did have some retinal detachment in areas and that was repaired.  She denies any other changes in her health.  She is exercising three times a week.  Overall she feels good.  She has no concerns.  Medications and allergies reviewed with patient and updated if appropriate.  Patient Active Problem List   Diagnosis Date Noted  . Jaw pain 12/01/2018  . Prediabetes 11/08/2017  . Hyperlipidemia 11/08/2017  . Vitamin D deficiency 11/06/2016  . Breast cancer of upper-inner quadrant of right female breast ([place]) 10/16/2015  . Generalized anxiety disorder 01/04/2013  . Hypothyroidism   . Insomnia 01/20/2012  . Snoring 01/20/2012    Current Outpatient Medications on File Prior to Visit  Medication Sig Dispense Refill  . anastrozole (ARIMIDEX) 1 MG tablet Take 1 tablet (1 mg total) by mouth daily. 90 tablet 3  . citalopram (CELEXA) 20 MG tablet Take 1 tablet (20 mg total) by mouth daily. 90 tablet 3  . cycloSPORINE (RESTASIS) 0.05 % ophthalmic emulsion Place into both eyes 2 (two) times daily.     . eszopiclone (LUNESTA) 2 MG TABS tablet TAKE 1 TABLET BY MOUTH AT  BEDTIME AS NEEDED FOR SLEEP IMMEDIATELY BEFORE BEDTIME. 90 tablet 0  . fluticasone (FLONASE) 50 MCG/ACT nasal spray fluticasone propionate 50 mcg/actuation nasal spray,suspension    . levothyroxine (SYNTHROID) 75 MCG tablet TAKE 1 TABLET BY MOUTH  DAILY BEFORE BREAKFAST 90 tablet 0  . Multiple Vitamin (MULTIVITAMIN) capsule Take 1 capsule by mouth daily.    . timolol (TIMOPTIC) 0.25 % ophthalmic solution INSTILL 1 DROP INTO RIGHT EYE EVERY MORNING    . tretinoin (RETIN-A) 0.025 % cream Apply topically at bedtime. 45 g 0    No current facility-administered medications on file prior to visit.    Past Medical History:  Diagnosis Date  . Anxiety    situational  . 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 Cataract    OU  . History of chicken pox   . History of colon polyps   . Hypothyroid   . Personal history of radiation therapy     Past Surgical History:  Procedure Laterality Date  . BREAST BIOPSY    . BREAST EXCISIONAL BIOPSY Left 1998  . BREAST LUMPECTOMY Right 2016  . BREAST SURGERY  1998   fibroid adenoma   . RADIOACTIVE SEED GUIDED PARTIAL MASTECTOMY WITH AXILLARY SENTINEL LYMPH NODE BIOPSY Right 11/16/2015   Procedure: RADIOACTIVE SEED GUIDED PARTIAL MASTECTOMY WITH AXILLARY SENTINEL LYMPH NODE BIOPSY;  Surgeon: Erroll Luna, MD;  Location: [facility];  Service: General;  Laterality: Right;    Social History   Socioeconomic History  . Marital status: Married    Spouse name: Not on file  . Number of children: 3  . Years of education: Not on file  . Highest education level: Not on file  Occupational History  . Occupation: Engineer, maintenance    Comment: Pharmacist, community  Tobacco Use  . Smoking status: Never Smoker  . Smokeless tobacco: Never Used  Substance and Sexual  Activity  . Alcohol use: Yes    Comment: 1 to 2 glasses of wine a week   . Drug use: No  . Sexual activity: Not on file  Other Topics Concern  . Not on file  Social History Narrative  . Not on file   Social Determinants of Health   Financial Resource Strain:   . Difficulty of Paying Living Expenses: Not on file  Food Insecurity:   . Worried About Charity fundraiser in the Last Year: Not on file  . Ran Out of Food in the Last Year: Not on file  Transportation Needs:   . Lack of Transportation (Medical): Not on file  . Lack of Transportation (Non-Medical): Not on file  Physical Activity:   . Days of Exercise per Week: Not on file  . Minutes of Exercise  per Session: Not on file  Stress:   . Feeling of Stress : Not on file  Social Connections:   . Frequency of Communication with Friends and Family: Not on file  . Frequency of Social Gatherings with Friends and Family: Not on file  . Attends Religious Services: Not on file  . Active Member of Clubs or Organizations: Not on file  . Attends Archivist Meetings: Not on file  . Marital Status: Not on file    Family History  Problem Relation Age of Onset  . Colon cancer Paternal Grandfather        died age 65  . Hypertension Mother 70  . Hyperlipidemia Mother   . Thyroid nodules Mother   . Diabetes Father 53  . Hyperlipidemia Father   . Hypertension Father   . GER disease Father   . Coronary artery disease Father        angioplasty age 26s  . Atrial fibrillation Father        on coumadin  . Gout Brother   . Thyroid cancer Sister     Review of Systems  Constitutional: Negative for chills and fever.  Eyes: Negative for visual disturbance.  Respiratory: Negative for cough, shortness of breath and wheezing.   Cardiovascular: Negative for chest pain, palpitations and leg swelling.  Gastrointestinal: Negative for abdominal pain, blood in stool, constipation, diarrhea and nausea.       Rare gerd  Genitourinary: Negative for dysuria and hematuria.  Musculoskeletal: Negative for arthralgias and back pain.  Skin: Negative for color change and rash.  Neurological: Negative for light-headedness and headaches.  Psychiatric/Behavioral: Negative for dysphoric mood. The patient is not nervous/anxious.        Objective:   Vitals:   12/10/19 1507  BP: 120/78  Pulse: 74  Resp: 16  Temp: 98.2 F (36.8 C)  SpO2: 98%   Filed Weights   12/10/19 1507  Weight: 134 lb (60.8 kg)   Body mass index is 22.3 kg/m.  BP Readings from Last 3 Encounters:  12/10/19 120/78  12/01/18 130/82  08/18/18 122/70    Wt Readings from Last 3 Encounters:  12/10/19 134 lb (60.8 kg)   12/01/18 133 lb 6.4 oz (60.5 kg)  08/18/18 131 lb 4.8 oz (59.6 kg)     Physical Exam Constitutional: She appears well-developed and well-nourished. No distress.  HENT:  Head: Normocephalic and atraumatic.  Right Ear: External ear normal. Normal ear canal and TM Left Ear: External ear normal.  Normal ear canal and TM Mouth/Throat: Oropharynx is clear and moist.  Eyes: Conjunctivae and EOM are normal.  Neck: Neck supple. No tracheal deviation  present. No thyromegaly present.  No carotid bruit  Cardiovascular: Normal rate, regular rhythm and normal heart sounds.   No murmur heard.  No edema. Pulmonary/Chest: Effort normal and breath sounds normal. No respiratory distress. She has no wheezes. She has no rales.  Breast: deferred   Abdominal: Soft. She exhibits no distension. There is no tenderness.  Lymphadenopathy: She has no cervical adenopathy.  Skin: Skin is warm and dry. She is not diaphoretic.  Psychiatric: She has a normal mood and affect. Her behavior is normal.        Assessment & Plan:   Physical exam: Screening blood work    ordered Immunizations  Up to date  Colonoscopy  Up to date - due 04/2020 Mammogram  Up to date  Gyn  Up to date  Eye exams  Up to date  Exercise  Regular - bikes, runs Weight  Normal BMI Substance abuse  none  See Problem List for Assessment and Plan of chronic medical problems.   This visit occurred during the SARS-CoV-2 public health emergency.  Safety protocols were in place, including screening questions prior to the visit, additional usage of staff PPE, and extensive cleaning of exam room while observing appropriate contact time as indicated for disinfecting solutions.

## 2019-12-09 NOTE — Patient Instructions (Addendum)
Blood work was ordered.    All other Health Maintenance issues reviewed.   All recommended immunizations and age-appropriate screenings are up-to-date or discussed.  No immunization administered today.   Medications reviewed and updated.  Changes include :   none    Please followup in 6 months    Health Maintenance, Female Adopting a healthy lifestyle and getting preventive care are important in promoting health and wellness. Ask your health care provider about:  The right schedule for you to have regular tests and exams.  Things you can do on your own to prevent diseases and keep yourself healthy. What should I know about diet, weight, and exercise? Eat a healthy diet   Eat a diet that includes plenty of vegetables, fruits, low-fat dairy products, and lean protein.  Do not eat a lot of foods that are high in solid fats, added sugars, or sodium. Maintain a healthy weight Body mass index (BMI) is used to identify weight problems. It estimates body fat based on height and weight. Your health care provider can help determine your BMI and help you achieve or maintain a healthy weight. Get regular exercise Get regular exercise. This is one of the most important things you can do for your health. Most adults should:  Exercise for at least 150 minutes each week. The exercise should increase your heart rate and make you sweat (moderate-intensity exercise).  Do strengthening exercises at least twice a week. This is in addition to the moderate-intensity exercise.  Spend less time sitting. Even light physical activity can be beneficial. Watch cholesterol and blood lipids Have your blood tested for lipids and cholesterol at 60 years of age, then have this test every 5 years. Have your cholesterol levels checked more often if:  Your lipid or cholesterol levels are high.  You are older than 60 years of age.  You are at high risk for heart disease. What should I know about cancer  screening? Depending on your health history and family history, you may need to have cancer screening at various ages. This may include screening for:  Breast cancer.  Cervical cancer.  Colorectal cancer.  Skin cancer.  Lung cancer. What should I know about heart disease, diabetes, and high blood pressure? Blood pressure and heart disease  High blood pressure causes heart disease and increases the risk of stroke. This is more likely to develop in people who have high blood pressure readings, are of African descent, or are overweight.  Have your blood pressure checked: ? Every 3-5 years if you are 18-39 years of age. ? Every year if you are 40 years old or older. Diabetes Have regular diabetes screenings. This checks your fasting blood sugar level. Have the screening done:  Once every three years after age 40 if you are at a normal weight and have a low risk for diabetes.  More often and at a younger age if you are overweight or have a high risk for diabetes. What should I know about preventing infection? Hepatitis B If you have a higher risk for hepatitis B, you should be screened for this virus. Talk with your health care provider to find out if you are at risk for hepatitis B infection. Hepatitis C Testing is recommended for:  Everyone born from 1945 through 1965.  Anyone with known risk factors for hepatitis C. Sexually transmitted infections (STIs)  Get screened for STIs, including gonorrhea and chlamydia, if: ? You are sexually active and are younger than 60 years of   age. ? You are older than 60 years of age and your health care provider tells you that you are at risk for this type of infection. ? Your sexual activity has changed since you were last screened, and you are at increased risk for chlamydia or gonorrhea. Ask your health care provider if you are at risk.  Ask your health care provider about whether you are at high risk for HIV. Your health care provider may  recommend a prescription medicine to help prevent HIV infection. If you choose to take medicine to prevent HIV, you should first get tested for HIV. You should then be tested every 3 months for as long as you are taking the medicine. Pregnancy  If you are about to stop having your period (premenopausal) and you may become pregnant, seek counseling before you get pregnant.  Take 400 to 800 micrograms (mcg) of folic acid every day if you become pregnant.  Ask for birth control (contraception) if you want to prevent pregnancy. Osteoporosis and menopause Osteoporosis is a disease in which the bones lose minerals and strength with aging. This can result in bone fractures. If you are 65 years old or older, or if you are at risk for osteoporosis and fractures, ask your health care provider if you should:  Be screened for bone loss.  Take a calcium or vitamin D supplement to lower your risk of fractures.  Be given hormone replacement therapy (HRT) to treat symptoms of menopause. Follow these instructions at home: Lifestyle  Do not use any products that contain nicotine or tobacco, such as cigarettes, e-cigarettes, and chewing tobacco. If you need help quitting, ask your health care provider.  Do not use street drugs.  Do not share needles.  Ask your health care provider for help if you need support or information about quitting drugs. Alcohol use  Do not drink alcohol if: ? Your health care provider tells you not to drink. ? You are pregnant, may be pregnant, or are planning to become pregnant.  If you drink alcohol: ? Limit how much you use to 0-1 drink a day. ? Limit intake if you are breastfeeding.  Be aware of how much alcohol is in your drink. In the [place] one drink equals one 12 oz bottle of beer (355 mL), one 5 oz glass of wine (148 mL), or one 1 oz glass of hard liquor (44 mL). General instructions  Schedule regular health, dental, and eye exams.  Stay current with your  vaccines.  Tell your health care provider if: ? You often feel depressed. ? You have ever been abused or do not feel safe at home. Summary  Adopting a healthy lifestyle and getting preventive care are important in promoting health and wellness.  Follow your health care provider's instructions about healthy diet, exercising, and getting tested or screened for diseases.  Follow your health care provider's instructions on monitoring your cholesterol and blood pressure. This information is not intended to replace advice given to you by your health care provider. Make sure you discuss any questions you have with your health care provider. Document Revised: 11/04/2018 Document Reviewed: 11/04/2018 Elsevier Patient Education  2020 [employer]  

## 2019-12-10 ENCOUNTER — Other Ambulatory Visit: Payer: Self-pay

## 2019-12-10 ENCOUNTER — Encounter: Payer: Self-pay | Admitting: Internal Medicine

## 2019-12-10 ENCOUNTER — Ambulatory Visit (INDEPENDENT_AMBULATORY_CARE_PROVIDER_SITE_OTHER): Payer: 59 | Admitting: Internal Medicine

## 2019-12-10 VITALS — BP 120/78 | HR 74 | Temp 98.2°F | Resp 16 | Ht 65.0 in | Wt 134.0 lb

## 2019-12-10 DIAGNOSIS — F411 Generalized anxiety disorder: Secondary | ICD-10-CM

## 2019-12-10 DIAGNOSIS — Z0000 Encounter for general adult medical examination without abnormal findings: Secondary | ICD-10-CM

## 2019-12-10 DIAGNOSIS — E039 Hypothyroidism, unspecified: Secondary | ICD-10-CM | POA: Diagnosis not present

## 2019-12-10 DIAGNOSIS — G4700 Insomnia, unspecified: Secondary | ICD-10-CM

## 2019-12-10 DIAGNOSIS — E782 Mixed hyperlipidemia: Secondary | ICD-10-CM

## 2019-12-10 DIAGNOSIS — R7303 Prediabetes: Secondary | ICD-10-CM

## 2019-12-10 LAB — CBC WITH DIFFERENTIAL/PLATELET
Basophils Absolute: 0 10*3/uL (ref 0.0–0.1)
Basophils Relative: 0.4 % (ref 0.0–3.0)
Eosinophils Absolute: 0.1 10*3/uL (ref 0.0–0.7)
Eosinophils Relative: 2.4 % (ref 0.0–5.0)
HCT: 38.6 % (ref 36.0–46.0)
Hemoglobin: 12.9 g/dL (ref 12.0–15.0)
Lymphocytes Relative: 28.9 % (ref 12.0–46.0)
Lymphs Abs: 1.2 10*3/uL (ref 0.7–4.0)
MCHC: 33.4 g/dL (ref 30.0–36.0)
MCV: 93.3 fl (ref 78.0–100.0)
Monocytes Absolute: 0.4 10*3/uL (ref 0.1–1.0)
Monocytes Relative: 9.6 % (ref 3.0–12.0)
Neutro Abs: 2.4 10*3/uL (ref 1.4–7.7)
Neutrophils Relative %: 58.7 % (ref 43.0–77.0)
Platelets: 280 10*3/uL (ref 150.0–400.0)
RBC: 4.13 Mil/uL (ref 3.87–5.11)
RDW: 13.5 % (ref 11.5–15.5)
WBC: 4.1 10*3/uL (ref 4.0–10.5)

## 2019-12-10 LAB — COMPREHENSIVE METABOLIC PANEL
ALT: 15 U/L (ref 0–35)
AST: 18 U/L (ref 0–37)
Albumin: 4.7 g/dL (ref 3.5–5.2)
Alkaline Phosphatase: 40 U/L (ref 39–117)
BUN: 17 mg/dL (ref 6–23)
CO2: 30 mEq/L (ref 19–32)
Calcium: 9.8 mg/dL (ref 8.4–10.5)
Chloride: 99 mEq/L (ref 96–112)
Creatinine, Ser: 0.77 mg/dL (ref 0.40–1.20)
GFR: 76.69 mL/min (ref >60.00)
Glucose, Bld: 94 mg/dL (ref 70–99)
Potassium: 3.8 mEq/L (ref 3.5–5.1)
Sodium: 136 mEq/L (ref 135–145)
Total Bilirubin: 0.4 mg/dL (ref 0.2–1.2)
Total Protein: 7.2 g/dL (ref 6.0–8.3)

## 2019-12-10 LAB — HEMOGLOBIN A1C: Hgb A1c MFr Bld: 5.7 % (ref 4.6–6.5)

## 2019-12-10 LAB — LIPID PANEL
Cholesterol: 274 mg/dL — ABNORMAL HIGH (ref 0–200)
HDL: 63.2 mg/dL (ref >39.00)
LDL Cholesterol: 196 mg/dL — ABNORMAL HIGH (ref 0–99)
NonHDL: 211.25
Total CHOL/HDL Ratio: 4
Triglycerides: 76 mg/dL (ref 0.0–149.0)
VLDL: 15.2 mg/dL (ref 0.0–40.0)

## 2019-12-10 NOTE — Assessment & Plan Note (Signed)
Chronic Takes lunesta as needed Usually takes 1/2 Will continue

## 2019-12-10 NOTE — Assessment & Plan Note (Signed)
Chronic Controlled, stable Continue current dose of medication  

## 2019-12-10 NOTE — Assessment & Plan Note (Signed)
Chronic  Clinically euthyroid Check tsh  Titrate med dose if needed  

## 2019-12-10 NOTE — Assessment & Plan Note (Signed)
Chronic Likely genetic in nature She is exercising regularly and eats healthy We will check lipid panel, CMP She would prefer to avoid medication CT cardiac calcium score 0     12/18/2018 She would prefer to try dietary changes and will see a nutritionist if needed Follow-up in 6 months and will repeat lipids then and consider medication at that time if needed

## 2019-12-10 NOTE — Assessment & Plan Note (Signed)
Chronic Check a1c Low sugar / carb diet Stressed regular exercise  

## 2019-12-13 ENCOUNTER — Encounter: Payer: Self-pay | Admitting: Internal Medicine

## 2019-12-13 LAB — TSH: TSH: 1.44 u[IU]/mL (ref 0.35–4.50)

## 2019-12-27 ENCOUNTER — Other Ambulatory Visit: Payer: Self-pay | Admitting: Internal Medicine

## 2019-12-29 ENCOUNTER — Encounter: Payer: Self-pay | Admitting: Internal Medicine

## 2020-01-04 ENCOUNTER — Other Ambulatory Visit: Payer: Self-pay | Admitting: Internal Medicine

## 2020-02-18 ENCOUNTER — Encounter (INDEPENDENT_AMBULATORY_CARE_PROVIDER_SITE_OTHER): Payer: 59 | Admitting: Ophthalmology

## 2020-03-10 ENCOUNTER — Encounter (INDEPENDENT_AMBULATORY_CARE_PROVIDER_SITE_OTHER): Payer: 59 | Admitting: Ophthalmology

## 2020-03-14 NOTE — Progress Notes (Signed)
[facility] Note  03/17/2020     CHIEF COMPLAINT Patient presents for Retina Follow Up   HISTORY OF PRESENT ILLNESS: Jeanne Parker is a 59 y.o. female who presents to the clinic today for:   HPI    Retina Follow Up    Patient presents with  Other.  In both eyes.  This started 7 months ago.  Severity is moderate.  I, the attending physician,  performed the HPI with the patient and updated documentation appropriately.          Comments    Patient here for 7 months retina follow up for lattice with holes OU. Patient states vision doing good. No eye pain.        Last edited by Bernarda Caffey, MD on 03/18/2020 11:22 PM. (History)    pt states no complaints today, she has been using Restasis and seeing Dr. Satira Sark every 6 months   Referring physician: Binnie Rail, MD [place],  [place] [id number]  HISTORICAL INFORMATION:   Selected notes from the MEDICAL RECORD NUMBER Referred by Dr. Wyatt Portela for concern of iritis, operculated hole LEE: 05.11.20 (S. Groat)   Ocular Hx- PMH-anxiety, breast cancer, hypothyroid    CURRENT MEDICATIONS: Current Outpatient Medications (Ophthalmic Drugs)  Medication Sig  . cycloSPORINE (RESTASIS) 0.05 % ophthalmic emulsion Place into both eyes 2 (two) times daily.    No current facility-administered medications for this visit. (Ophthalmic Drugs)   Current Outpatient Medications (Other)  Medication Sig  . anastrozole (ARIMIDEX) 1 MG tablet Take 1 tablet (1 mg total) by mouth daily.  . citalopram (CELEXA) 20 MG tablet TAKE 1 TABLET BY MOUTH  DAILY  . eszopiclone (LUNESTA) 2 MG TABS tablet TAKE 1 TABLET BY MOUTH AT  BEDTIME AS NEEDED FOR SLEEP IMMEDIATELY BEFORE BEDTIME.  . fluticasone (FLONASE) 50 MCG/ACT nasal spray fluticasone propionate 50 mcg/actuation nasal spray,suspension  . levothyroxine (SYNTHROID) 75 MCG tablet TAKE 1 TABLET BY MOUTH  DAILY BEFORE BREAKFAST  . Multiple Vitamin  (MULTIVITAMIN) capsule Take 1 capsule by mouth daily.  Marland Kitchen tretinoin (RETIN-A) 0.025 % cream Apply topically at bedtime.   No current facility-administered medications for this visit. (Other)      REVIEW OF SYSTEMS: ROS    Positive for: Eyes   Negative for: Constitutional, Gastrointestinal, Neurological, Skin, Genitourinary, Musculoskeletal, HENT, Endocrine, Cardiovascular, Respiratory, Psychiatric, Allergic/Imm, Heme/Lymph   Last edited by Theodore Demark, COA on 03/17/2020  1:55 PM. (History)       ALLERGIES No Known Allergies  PAST MEDICAL HISTORY Past Medical History:  Diagnosis Date  . Anxiety    situational  . 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 Cataract    OU  . History of chicken pox   . History of colon polyps   . Hypothyroid   . Personal history of radiation therapy    Past Surgical History:  Procedure Laterality Date  . BREAST BIOPSY    . BREAST EXCISIONAL BIOPSY Left 1998  . BREAST LUMPECTOMY Right 2016  . BREAST SURGERY  1998   fibroid adenoma   . RADIOACTIVE SEED GUIDED PARTIAL MASTECTOMY WITH AXILLARY SENTINEL LYMPH NODE BIOPSY Right 11/16/2015   Procedure: RADIOACTIVE SEED GUIDED PARTIAL MASTECTOMY WITH AXILLARY SENTINEL LYMPH NODE BIOPSY;  Surgeon: Erroll Luna, MD;  Location: [facility];  Service: General;  Laterality: Right;    FAMILY HISTORY Family History  Problem Relation Age  of Onset  . Colon cancer Paternal Grandfather        died age 36  . Hypertension Mother 89  . Hyperlipidemia Mother   . Thyroid nodules Mother   . Diabetes Father 8  . Hyperlipidemia Father   . Hypertension Father   . GER disease Father   . Coronary artery disease Father        angioplasty age 25s  . Atrial fibrillation Father        on coumadin  . Gout Brother   . Thyroid cancer Sister     SOCIAL HISTORY Social History   Tobacco Use  . Smoking status: Never Smoker  . Smokeless tobacco: Never Used   Substance Use Topics  . Alcohol use: Yes    Comment: 1 to 2 glasses of wine a week   . Drug use: No         OPHTHALMIC EXAM:  Base Eye Exam    Visual Acuity (Snellen - Linear)      Right Left   Dist cc 20/20 -1 20/20 -1   Correction: Contacts  Mono vision OD distance and OS Near used J Card OS       Tonometry (Tonopen, 1:51 PM)      Right Left   Pressure 19 18       Pupils      Dark Light Shape React APD   Right 6 5 Round Brisk None   Left 6 5 Round Brisk None       Visual Fields (Counting fingers)      Left Right    Full Full       Extraocular Movement      Right Left    Full, Ortho Full, Ortho       Neuro/Psych    Oriented x3: Yes   Mood/Affect: Normal       Dilation    Both eyes: 1.0% Mydriacyl, 2.5% Phenylephrine @ 1:51 PM        Slit Lamp and Fundus Exam    Slit Lamp Exam      Right Left   Lids/Lashes mild Meibomian gland dysfunction mild Meibomian gland dysfunction   Conjunctiva/Sclera White and quiet White and quiet   Cornea mild Arcus, trace Punctate epithelial erosions superiorly, Debris in tear film mild Arcus, trace Punctate epithelial erosions   Anterior Chamber Deep and quiet deep and clear   Iris Round and well dilated Round and well dilated   Lens 2+ Nuclear sclerosis, 2+ Cortical cataract, trace Posterior subcapsular cataract 2+ Nuclear sclerosis, 2+ Cortical cataract   Vitreous Vitreous syneresis, +pigment in anterior vitreous, Posterior vitreous detachment Vitreous syneresis       Fundus Exam      Right Left   Disc Pink and Sharp, mild tilt, Peripapillary atrophy, Compact Tilted disc, temporal Peripapillary atrophy, Pink and Sharp, Compact   C/D Ratio 0.4 0.5   Macula Flat, blunted foveal reflex, trace ERM, mild Retinal pigment epithelial mottling, shallow cystic changes / retinoschisis nasal macula Flat, blunted foveal reflex, mild Retinal pigment epithelial mottling, No heme or edema   Vessels mild Vascular attenuation mild  Vascular attenuation   Periphery Attached, superior lattice w/ atrophic holes at 1030 and 1200 -- good laser surrounding; focal areas of pigment clumping at 0600 and 0700 Attached, pigmented lattice at 0100 and 0430-0600; atrophic hole w/ mild cuff of SRF at 0500 -- good laser surrounding all lesions        Refraction    Wearing Rx  Sphere Cylinder Axis   Right -5.25 +0.50 130   Left -5.50 +0.50 117          IMAGING AND PROCEDURES  Imaging and Procedures for @TODAY @  OCT, Retina - OU - Both Eyes       Right Eye Quality was good. Central Foveal Thickness: 253. Progression has improved. Findings include abnormal foveal contour, intraretinal fluid, no SRF, myopic contour (Interval improvement in nasal retinoschisis ).   Left Eye Quality was good. Central Foveal Thickness: 264. Progression has been stable. Findings include normal foveal contour, no SRF, no IRF, myopic contour (Partial PVD).   Notes *Images captured and stored on drive  Diagnosis / Impression:  OD: myopic retinoschisis peripapillary nasal macula -- interval improvement from prior OS: NFP, no IRF/SRF   Clinical management:  See below  Abbreviations: NFP - Normal foveal profile. CME - cystoid macular edema. PED - pigment epithelial detachment. IRF - intraretinal fluid. SRF - subretinal fluid. EZ - ellipsoid zone. ERM - epiretinal membrane. ORA - outer retinal atrophy. ORT - outer retinal tubulation. SRHM - subretinal hyper-reflective material               ASSESSMENT/PLAN:    ICD-10-CM   1. Bilateral retinal lattice degeneration  H35.413   2. Retinal hole of both eyes  H33.323   3. 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 Retinal edema  H35.81 OCT, Retina - OU - Both Eyes  6. Combined forms of age-related cataract of both eyes  H25.813   7. Myopia of both eyes  H52.13     1-3. Lattice degeneration w/ atrophic holes, OU; OS with focal SRF/RD  - OD: atrophic hole  at 1030, mild lattice with small hole at 1200  - OS: pigmented lattice at 0100 and 0430-0500--atrophic hole and +SRF at 0500  - s/p laser retinopexy OS (05.11.20), fill-in (06.05.20) -- good laser changes surrounding all lesions  - s/p laser retinopexy OD (05.18.20), fill-in (06.05.20) -- good laser changes surrounding all lesions  - f/u 9-12 months  4,5. Retinal edema, myopic retinoschisis OD  - nasal IRF/cystic macular changes extending from inf temp disc -- +intraretinal stranding  - FA (05.18.20) without hyperfluorescence/leakage suggesting not CME or optic pit -- myopic retinoschisis  -  improved from prior on OCT -- will continue to monitor  - f/u 9-12 months  6. Mixed form age related cataract OU  - The symptoms of cataract, surgical options, and treatments and risks were discussed with patient.  - discussed diagnosis and progression  - not yet visually significant  - monitor for now  7. Myopia OU  - discussed association of myopia with lattice degeneration and RT/RD and retinoschisis  - monitor    Ophthalmic Meds Ordered this visit:  No orders of the defined types were placed in this encounter.      Return for f/u 9-12 months, lattice degeneration OU, DFE, OCT.  There are no Patient Instructions on file for this visit.   Explained the diagnoses, plan, and follow up with the patient and they expressed understanding.  Patient expressed understanding of the importance of proper follow up care.   This document serves as a record of services personally performed by Gardiner Sleeper, MD, PhD. It was created on their behalf by Ernest Mallick, OA, an ophthalmic assistant. The creation of this record is the provider's dictation and/or activities during the visit.    Electronically signed by: Ernest Mallick, OA  04.20.2021 11:25 PM  Gardiner Sleeper, M.D., Ph.D. Diseases & Surgery of the Retina and Vitreous [facility]  I have reviewed the above  documentation for accuracy and completeness, and I agree with the above. Gardiner Sleeper, M.D., Ph.D. 03/18/20 11:25 PM   Abbreviations: M myopia (nearsighted); A astigmatism; H hyperopia (farsighted); P presbyopia; Mrx spectacle prescription;  CTL contact lenses; OD right eye; OS left eye; OU both eyes  XT exotropia; ET esotropia; PEK punctate epithelial keratitis; PEE punctate epithelial erosions; DES dry eye syndrome; MGD meibomian gland dysfunction; ATs artificial tears; PFAT's preservative free artificial tears; [place] nuclear sclerotic cataract; PSC posterior subcapsular cataract; ERM epi-retinal membrane; PVD posterior vitreous detachment; RD retinal detachment; DM diabetes mellitus; DR diabetic retinopathy; NPDR non-proliferative diabetic retinopathy; PDR proliferative diabetic retinopathy; CSME clinically significant macular edema; DME diabetic macular edema; dbh dot blot hemorrhages; CWS cotton wool spot; POAG primary open angle glaucoma; C/D cup-to-disc ratio; HVF humphrey visual field; GVF goldmann visual field; OCT optical coherence tomography; IOP intraocular pressure; BRVO Branch retinal vein occlusion; CRVO central retinal vein occlusion; CRAO central retinal artery occlusion; BRAO branch retinal artery occlusion; RT retinal tear; SB scleral buckle; PPV pars plana vitrectomy; VH Vitreous hemorrhage; PRP panretinal laser photocoagulation; IVK intravitreal kenalog; VMT vitreomacular traction; MH Macular hole;  NVD neovascularization of the disc; NVE neovascularization elsewhere; AREDS age related eye disease study; ARMD age related macular degeneration; POAG primary open angle glaucoma; EBMD epithelial/anterior basement membrane dystrophy; ACIOL anterior chamber intraocular lens; IOL intraocular lens; PCIOL posterior chamber intraocular lens; Phaco/IOL phacoemulsification with intraocular lens placement; [place] photorefractive keratectomy; LASIK laser assisted in situ keratomileusis; HTN hypertension; DM  diabetes mellitus; COPD chronic obstructive pulmonary disease

## 2020-03-17 ENCOUNTER — Other Ambulatory Visit: Payer: Self-pay

## 2020-03-17 ENCOUNTER — Encounter (INDEPENDENT_AMBULATORY_CARE_PROVIDER_SITE_OTHER): Payer: Self-pay | Admitting: Ophthalmology

## 2020-03-17 ENCOUNTER — Ambulatory Visit (INDEPENDENT_AMBULATORY_CARE_PROVIDER_SITE_OTHER): Payer: 59 | Admitting: Ophthalmology

## 2020-03-17 DIAGNOSIS — H33323 Round hole, bilateral: Secondary | ICD-10-CM

## 2020-03-17 DIAGNOSIS — H3322 Serous retinal detachment, left eye: Secondary | ICD-10-CM | POA: Diagnosis not present

## 2020-03-17 DIAGNOSIS — H33101 Unspecified retinoschisis, right eye: Secondary | ICD-10-CM

## 2020-03-17 DIAGNOSIS — H3581 Retinal edema: Secondary | ICD-10-CM

## 2020-03-17 DIAGNOSIS — H35413 Lattice degeneration of retina, bilateral: Secondary | ICD-10-CM

## 2020-03-17 DIAGNOSIS — H25813 Combined forms of age-related cataract, bilateral: Secondary | ICD-10-CM

## 2020-03-17 DIAGNOSIS — H1830 Unspecified corneal membrane change: Secondary | ICD-10-CM

## 2020-03-17 DIAGNOSIS — H5213 Myopia, bilateral: Secondary | ICD-10-CM

## 2020-03-17 DIAGNOSIS — H209 Unspecified iridocyclitis: Secondary | ICD-10-CM

## 2020-03-17 DIAGNOSIS — H40053 Ocular hypertension, bilateral: Secondary | ICD-10-CM

## 2020-03-17 DIAGNOSIS — H04123 Dry eye syndrome of bilateral lacrimal glands: Secondary | ICD-10-CM

## 2020-03-17 DIAGNOSIS — H35033 Hypertensive retinopathy, bilateral: Secondary | ICD-10-CM

## 2020-03-18 ENCOUNTER — Encounter (INDEPENDENT_AMBULATORY_CARE_PROVIDER_SITE_OTHER): Payer: Self-pay | Admitting: Ophthalmology

## 2020-05-30 ENCOUNTER — Other Ambulatory Visit: Payer: Self-pay | Admitting: Hematology and Oncology

## 2020-05-31 ENCOUNTER — Telehealth: Payer: Self-pay | Admitting: Hematology and Oncology

## 2020-05-31 NOTE — Telephone Encounter (Signed)
Scheduled appt per 7/7 sch msg - pt is aware of appt date and time . Pt request end of September appt.

## 2020-07-17 ENCOUNTER — Encounter: Payer: Self-pay | Admitting: Internal Medicine

## 2020-07-17 ENCOUNTER — Telehealth: Payer: Self-pay | Admitting: Internal Medicine

## 2020-07-17 NOTE — Telephone Encounter (Signed)
My chart message already sent for Dr. Quay Burow to look at and address.

## 2020-07-17 NOTE — Telephone Encounter (Signed)
New Message:   Pt is calling and states she has some large red spots on her back from a sting. Pt states she will try and send a picture through her mychart acct but she is unsure as of to what they are. She states they are itchy. Pt does have an appt on 07/19/20 if nothing can be called in for her. Please advise.

## 2020-07-18 NOTE — Progress Notes (Deleted)
Subjective:    Patient ID: Jeanne Parker, female    DOB: [birth year], 60 y.o.   MRN: [id number]  HPI The patient is here for an acute visit.  Stings, red spots   Medications and allergies reviewed with patient and updated if appropriate.  Patient Active Problem List   Diagnosis Date Noted  . Jaw pain 12/01/2018  . Prediabetes 11/08/2017  . Hyperlipidemia 11/08/2017  . Vitamin D deficiency 11/06/2016  . Breast cancer of upper-inner quadrant of right female breast ([place]) 10/16/2015  . Generalized anxiety disorder 01/04/2013  . Hypothyroidism   . Insomnia 01/20/2012  . Snoring 01/20/2012    Current Outpatient Medications on File Prior to Visit  Medication Sig Dispense Refill  . anastrozole (ARIMIDEX) 1 MG tablet TAKE 1 TABLET BY MOUTH  DAILY 90 tablet 0  . citalopram (CELEXA) 20 MG tablet TAKE 1 TABLET BY MOUTH  DAILY 90 tablet 1  . cycloSPORINE (RESTASIS) 0.05 % ophthalmic emulsion Place into both eyes 2 (two) times daily.     . eszopiclone (LUNESTA) 2 MG TABS tablet TAKE 1 TABLET BY MOUTH AT  BEDTIME AS NEEDED FOR SLEEP IMMEDIATELY BEFORE BEDTIME. 90 tablet 0  . fluticasone (FLONASE) 50 MCG/ACT nasal spray fluticasone propionate 50 mcg/actuation nasal spray,suspension    . levothyroxine (SYNTHROID) 75 MCG tablet TAKE 1 TABLET BY MOUTH  DAILY BEFORE BREAKFAST 90 tablet 3  . Multiple Vitamin (MULTIVITAMIN) capsule Take 1 capsule by mouth daily.    Marland Kitchen tretinoin (RETIN-A) 0.025 % cream Apply topically at bedtime. 45 g 0   No current facility-administered medications on file prior to visit.    Past Medical History:  Diagnosis Date  . Anxiety    situational  . Breast cancer ([place])    R DCIS, on bx 10/11/15, s/p lumpectomy 11/16/15  . Cataract    OU  . History of chicken pox   . History of colon polyps   . Hypothyroid   . Personal history of radiation therapy     Past Surgical History:  Procedure Laterality Date  . BREAST BIOPSY    . BREAST EXCISIONAL BIOPSY Left  1998  . BREAST LUMPECTOMY Right 2016  . BREAST SURGERY  1998   fibroid adenoma   . RADIOACTIVE SEED GUIDED PARTIAL MASTECTOMY WITH AXILLARY SENTINEL LYMPH NODE BIOPSY Right 11/16/2015   Procedure: RADIOACTIVE SEED GUIDED PARTIAL MASTECTOMY WITH AXILLARY SENTINEL LYMPH NODE BIOPSY;  Surgeon: Erroll Luna, MD;  Location: [facility];  Service: General;  Laterality: Right;    Social History   Socioeconomic History  . Marital status: Married    Spouse name: Not on file  . Number of children: 3  . Years of education: Not on file  . Highest education level: Not on file  Occupational History  . Occupation: Engineer, maintenance    Comment: Pharmacist, community  Tobacco Use  . Smoking status: Never Smoker  . Smokeless tobacco: Never Used  Substance and Sexual Activity  . Alcohol use: Yes    Comment: 1 to 2 glasses of wine a week   . Drug use: No  . Sexual activity: Not on file  Other Topics Concern  . Not on file  Social History Narrative  . Not on file   Social Determinants of Health   Financial Resource Strain:   . Difficulty of Paying Living Expenses: Not on file  Food Insecurity:   . Worried About Charity fundraiser in the Last Year: Not on file  .  Ran Out of Food in the Last Year: Not on file  Transportation Needs:   . Lack of Transportation (Medical): Not on file  . Lack of Transportation (Non-Medical): Not on file  Physical Activity:   . Days of Exercise per Week: Not on file  . Minutes of Exercise per Session: Not on file  Stress:   . Feeling of Stress : Not on file  Social Connections:   . Frequency of Communication with Friends and Family: Not on file  . Frequency of Social Gatherings with Friends and Family: Not on file  . Attends Religious Services: Not on file  . Active Member of Clubs or Organizations: Not on file  . Attends Archivist Meetings: Not on file  . Marital Status: Not on file    Family History  Problem Relation Age of  Onset  . Colon cancer Paternal Grandfather        died age 41  . Hypertension Mother 79  . Hyperlipidemia Mother   . Thyroid nodules Mother   . Diabetes Father 69  . Hyperlipidemia Father   . Hypertension Father   . GER disease Father   . Coronary artery disease Father        angioplasty age 52s  . Atrial fibrillation Father        on coumadin  . Gout Brother   . Thyroid cancer Sister     Review of Systems     Objective:  There were no vitals filed for this visit. BP Readings from Last 3 Encounters:  12/10/19 120/78  12/01/18 130/82  08/18/18 122/70   Wt Readings from Last 3 Encounters:  12/10/19 134 lb (60.8 kg)  12/01/18 133 lb 6.4 oz (60.5 kg)  08/18/18 131 lb 4.8 oz (59.6 kg)   There is no height or weight on file to calculate BMI.   Physical Exam         Assessment & Plan:    See Problem List for Assessment and Plan of chronic medical problems.    This visit occurred during the SARS-CoV-2 public health emergency.  Safety protocols were in place, including screening questions prior to the visit, additional usage of staff PPE, and extensive cleaning of exam room while observing appropriate contact time as indicated for disinfecting solutions.

## 2020-07-19 ENCOUNTER — Ambulatory Visit: Payer: 59 | Admitting: Internal Medicine

## 2020-07-21 ENCOUNTER — Other Ambulatory Visit: Payer: Self-pay

## 2020-07-21 ENCOUNTER — Other Ambulatory Visit: Payer: 59

## 2020-07-21 DIAGNOSIS — Z20822 Contact with and (suspected) exposure to covid-19: Secondary | ICD-10-CM

## 2020-07-23 LAB — NOVEL CORONAVIRUS, NAA: SARS-CoV-2, NAA: NOT DETECTED

## 2020-07-23 LAB — SARS-COV-2, NAA 2 DAY TAT

## 2020-08-21 NOTE — Progress Notes (Signed)
Patient Care Team: Binnie Rail, MD as PCP - General (Internal Medicine) Rigoberto Noel, MD (Pulmonary Disease) Marylynn Pearson, MD (Obstetrics and Gynecology) Juanita Craver, MD (Gastroenterology) Erroll Luna, MD as Consulting Physician (General Surgery) Nicholas Lose, MD as Consulting Physician (Hematology and Oncology) Eppie Gibson, MD as Attending Physician (Radiation Oncology) Sylvan Cheese, NP as Nurse Practitioner (Hematology and Oncology)  DIAGNOSIS:    ICD-10-CM   1. 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 ER 95%, PR 80%, HER-2 negative ratio 1.15, Ki-67 3%   10/11/2015 Clinical Stage   Stage IA: T1c N0   11/16/2015 Surgery   Right lumpectomy: Invasive ductal carcinoma 2.7 cm with DCIS, grade 1, left, LCIS, 0/4 lymph nodes negative   11/16/2015 Pathologic Stage   Stage IIA: T2 N0   11/16/2015 Oncotype testing   RS 11 (7% ROR)   01/02/2016 - 01/29/2016 Radiation Therapy   Adjuvant XRT   02/24/2016 -  Anti-estrogen oral therapy   Tamoxifen 20 mg daily switched to letrozole 2.5 mg daily   03/01/2016 Survivorship   Survivorship visit completed     CHIEF COMPLIANT: Follow-up of right breast cancer on anastrozole   INTERVAL HISTORY: Jeanne Parker is a 59 y.o. with above-mentioned history of right breast cancer who underwent lumpectomy, radiation, and is currently on oral antiestrogen therapy with anastrozole, after she could not tolerate tamoxifen.Mammogram on 10/15/19 showed no evidence of malignancy bilaterally. She  presents to the clinic today for annual follow-up.    ALLERGIES:  has No Known Allergies.  MEDICATIONS:  Current Outpatient Medications  Medication Sig Dispense Refill  . anastrozole (ARIMIDEX) 1 MG tablet TAKE 1 TABLET BY MOUTH  DAILY 90 tablet 0  . citalopram (CELEXA) 20 MG tablet TAKE 1 TABLET BY MOUTH  DAILY 90 tablet 1  . cycloSPORINE (RESTASIS) 0.05 % ophthalmic emulsion Place into both eyes 2 (two) times daily.     . eszopiclone (LUNESTA) 2 MG TABS tablet TAKE 1 TABLET BY MOUTH AT  BEDTIME AS NEEDED FOR SLEEP IMMEDIATELY BEFORE BEDTIME. 90 tablet 0  . fluticasone (FLONASE) 50 MCG/ACT nasal spray fluticasone propionate 50 mcg/actuation nasal spray,suspension    . levothyroxine (SYNTHROID) 75 MCG tablet TAKE 1 TABLET BY MOUTH  DAILY BEFORE BREAKFAST 90 tablet 3  . Multiple Vitamin (MULTIVITAMIN) capsule Take 1 capsule by mouth daily.    Marland Kitchen tretinoin (RETIN-A) 0.025 % cream Apply topically at bedtime. 45 g 0   No current facility-administered medications for this visit.    PHYSICAL EXAMINATION: ECOG PERFORMANCE STATUS: 1 - Symptomatic but completely ambulatory  There were no vitals filed for this visit. There were no vitals filed for this visit.  BREAST: No palpable masses or nodules in either right or left breasts. No palpable axillary supraclavicular or infraclavicular adenopathy no breast tenderness or nipple discharge. (exam performed in the presence of a chaperone)  LABORATORY DATA:  I have reviewed the data as listed CMP Latest Ref Rng & Units 12/10/2019 12/02/2018 08/18/2018  Glucose 70 - 99 mg/dL 94 98 183(H)  BUN 6 - 23  mg/dL 17 17 14   Creatinine 0.40 - 1.20 mg/dL 0.77 0.72 0.80  Sodium 135 - 145 mEq/L 136 137 137  Potassium 3.5 - 5.1 mEq/L 3.8 4.1 4.1  Chloride 96 - 112 mEq/L 99 102 100  CO2 19 - 32 mEq/L 30 28 30   Calcium 8.4 - 10.5 mg/dL 9.8 9.9 9.9  Total Protein 6.0 - 8.3 g/dL 7.2 7.0 7.2  Total Bilirubin 0.2 - 1.2 mg/dL 0.4 0.5 0.3  Alkaline Phos 39 - 117 U/L 40  39 57  AST 0 - 37 U/L 18 18 16   ALT 0 - 35 U/L 15 16 15     Lab Results  Component Value Date   WBC 4.1 12/10/2019   HGB 12.9 12/10/2019   HCT 38.6 12/10/2019   MCV 93.3 12/10/2019   PLT 280.0 12/10/2019   NEUTROABS 2.4 12/10/2019    ASSESSMENT & PLAN:  Breast cancer of upper-inner quadrant of right female breast ([place]) Right lumpectomy 11/16/2015: Invasive ductal carcinoma 2.7 cm with DCIS, grade 1, left, LCIS, 0/4 lymph nodes negative, T2 N0 stage II a, ER 95%, PR 80%, HER-2 negative, Ki-67 3%, Oncotype Dx 11 (7% ROR) Adj XRT completed 01/29/16  Treatment: Anti-estrogen therapy with tamoxifen 20 mg daily started 02/24/2016 Switched to anastrozole 05/19/2017 when she became menopausal  Anastrozoletoxicities:Tolerating it extremely well without any hot flashes or myalgias.  Breast Cancer Surveillance: 1. Breast exam9/28/2021: Benign 2. Mammogram  11/20/2020benign. Postsurgical changes. Breast Density Category C. 3. Bone density 07/29/17: T score -1.7  Emotional issues: Patient is in the process of cutting down and discontinuing Celexa. She will try to half the dose. she got married in July 2019.  Her mom was diagnosed with polycythemia vera and is currently taking Hydrea.  She wants her to come and be seen by [place] because it is closer and more convenient to be here.  Return to clinicin oneyear.    No orders of the defined types were placed in this encounter.  The patient has a good understanding of the overall plan. she agrees with it. she will call with any problems that may develop before the next visit here.  Total time spent: 20 mins including face to face time and time spent for planning, charting and coordination of care  Nicholas Lose, MD 08/22/2020  I, Cloyde Reams Dorshimer, am acting as scribe for Dr. Nicholas Lose.  I have reviewed the above documentation for accuracy and completeness, and I agree with the above.

## 2020-08-22 ENCOUNTER — Other Ambulatory Visit: Payer: Self-pay

## 2020-08-22 ENCOUNTER — Inpatient Hospital Stay: Payer: 59 | Attending: Hematology and Oncology | Admitting: Hematology and Oncology

## 2020-08-22 DIAGNOSIS — Z923 Personal history of irradiation: Secondary | ICD-10-CM | POA: Diagnosis not present

## 2020-08-22 DIAGNOSIS — C50211 Malignant neoplasm of upper-inner quadrant of right female breast: Secondary | ICD-10-CM | POA: Diagnosis present

## 2020-08-22 DIAGNOSIS — Z170 Estrogen receptor positive status [ER+]: Secondary | ICD-10-CM | POA: Diagnosis not present

## 2020-08-22 MED ORDER — ANASTROZOLE 1 MG PO TABS: 1.0000 mg | ORAL_TABLET | Freq: Every day | ORAL | 3 refills | Status: DC

## 2020-08-22 MED ORDER — CITALOPRAM HYDROBROMIDE 20 MG PO TABS: 20.0000 mg | ORAL_TABLET | Freq: Every day | ORAL | 3 refills | Status: DC

## 2020-08-22 NOTE — Assessment & Plan Note (Signed)
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  Breast Cancer Surveillance: 1. Breast exam9/28/2021: Benign 2. Mammogram  11/20/2020benign. Postsurgical changes. Breast Density Category C. 3. Bone density 07/29/17: T score -1.7  Emotional issues: Patient is in the process of cutting down and discontinuing Celexa. She will try to half the dose. she got married in July 2019.  Return to clinicin oneyear.

## 2020-08-23 ENCOUNTER — Other Ambulatory Visit: Payer: Self-pay | Admitting: Hematology and Oncology

## 2020-09-08 ENCOUNTER — Other Ambulatory Visit: Payer: Self-pay | Admitting: Hematology and Oncology

## 2020-09-18 LAB — HM COLONOSCOPY

## 2020-09-27 ENCOUNTER — Encounter: Payer: Self-pay | Admitting: Internal Medicine

## 2020-10-05 ENCOUNTER — Encounter: Payer: Self-pay | Admitting: Internal Medicine

## 2020-10-05 NOTE — Progress Notes (Unsigned)
Outside notes received. Information abstracted. Notes sent to scan.  

## 2020-10-27 ENCOUNTER — Other Ambulatory Visit: Payer: Self-pay | Admitting: Hematology and Oncology

## 2020-10-27 DIAGNOSIS — Z853 Personal history of malignant neoplasm of breast: Secondary | ICD-10-CM

## 2020-11-01 ENCOUNTER — Other Ambulatory Visit: Payer: Self-pay | Admitting: Internal Medicine

## 2020-12-27 ENCOUNTER — Other Ambulatory Visit: Payer: Self-pay | Admitting: Internal Medicine

## 2021-02-14 ENCOUNTER — Other Ambulatory Visit: Payer: Self-pay

## 2021-02-14 ENCOUNTER — Other Ambulatory Visit: Payer: Self-pay | Admitting: Hematology and Oncology

## 2021-02-14 ENCOUNTER — Ambulatory Visit
Admission: RE | Admit: 2021-02-14 | Discharge: 2021-02-14 | Disposition: A | Discharge status: Home / Self Care | Payer: 59 | Source: Ambulatory Visit | Attending: Hematology and Oncology | Admitting: Hematology and Oncology

## 2021-02-14 DIAGNOSIS — Z853 Personal history of malignant neoplasm of breast: Secondary | ICD-10-CM

## 2021-03-20 ENCOUNTER — Other Ambulatory Visit: Payer: Self-pay | Admitting: Internal Medicine

## 2021-03-21 NOTE — Progress Notes (Shared)
[facility] Note  03/23/2021     CHIEF COMPLAINT Patient presents for No chief complaint on file.   HISTORY OF PRESENT ILLNESS: Jeanne Parker is a 60 y.o. female who presents to the clinic today for:   pt states no complaints today, she has been using Restasis and seeing Dr. Satira Sark every 6 months   Referring physician: Binnie Rail, MD [place],  [place] [id number]  HISTORICAL INFORMATION:   Selected notes from the MEDICAL RECORD NUMBER Referred by Dr. Wyatt Portela for concern of iritis, operculated hole LEE: 05.11.20 (S. [facility])   Ocular Hx- PMH-anxiety, breast cancer, hypothyroid    CURRENT MEDICATIONS: Current Outpatient Medications (Ophthalmic Drugs)  Medication Sig  . cycloSPORINE (RESTASIS) 0.05 % ophthalmic emulsion Place into both eyes 2 (two) times daily.    No current facility-administered medications for this visit. (Ophthalmic Drugs)   Current Outpatient Medications (Other)  Medication Sig  . anastrozole (ARIMIDEX) 1 MG tablet TAKE 1 TABLET BY MOUTH  DAILY  . citalopram (CELEXA) 20 MG tablet TAKE 1 TABLET BY MOUTH DAILY  . eszopiclone (LUNESTA) 2 MG TABS tablet TAKE 1 TABLET BY MOUTH AT  BEDTIME AS NEEDED FOR SLEEP IMMEDIATELY BEFORE BEDTIME.  . fluticasone (FLONASE) 50 MCG/ACT nasal spray fluticasone propionate 50 mcg/actuation nasal spray,suspension  . levothyroxine (SYNTHROID) 75 MCG tablet TAKE 1 TABLET BY MOUTH  DAILY BEFORE BREAKFAST  . Multiple Vitamin (MULTIVITAMIN) capsule Take 1 capsule by mouth daily.  Marland Kitchen tretinoin (RETIN-A) 0.025 % cream Apply topically at bedtime.   No current facility-administered medications for this visit. (Other)      REVIEW OF SYSTEMS:    ALLERGIES No Known Allergies  PAST MEDICAL HISTORY Past Medical History:  Diagnosis Date  . Anxiety    situational  . Breast cancer ([place])    R DCIS, on bx 10/11/15, s/p lumpectomy 11/16/15  . Cataract    OU  . History of chicken  pox   . History of colon polyps   . Hypothyroid   . Personal history of radiation therapy    Past Surgical History:  Procedure Laterality Date  . BREAST BIOPSY    . BREAST EXCISIONAL BIOPSY Left 1998  . BREAST LUMPECTOMY Right 2016  . BREAST SURGERY  1998   fibroid adenoma   . RADIOACTIVE SEED GUIDED PARTIAL MASTECTOMY WITH AXILLARY SENTINEL LYMPH NODE BIOPSY Right 11/16/2015   Procedure: RADIOACTIVE SEED GUIDED PARTIAL MASTECTOMY WITH AXILLARY SENTINEL LYMPH NODE BIOPSY;  Surgeon: Erroll Luna, MD;  Location: [facility];  Service: General;  Laterality: Right;    FAMILY HISTORY Family History  Problem Relation Age of Onset  . Colon cancer Paternal Grandfather        died age 57  . Hypertension Mother 63  . Hyperlipidemia Mother   . Thyroid nodules Mother   . Diabetes Father 52  . Hyperlipidemia Father   . Hypertension Father   . GER disease Father   . Coronary artery disease Father        angioplasty age 59s  . Atrial fibrillation Father        on coumadin  . Gout Brother   . Thyroid cancer Sister     SOCIAL HISTORY Social History   Tobacco Use  . Smoking status: Never Smoker  . Smokeless tobacco: Never Used  Substance Use Topics  . Alcohol use: Yes    Comment: 1 to 2 glasses of wine a week   .  Drug use: No         OPHTHALMIC EXAM:  Not recorded     IMAGING AND PROCEDURES  Imaging and Procedures for @TODAY @         ASSESSMENT/PLAN:    ICD-10-CM   1. Bilateral retinal lattice degeneration  H35.413   2. Retinal hole of both eyes  H33.323   3. 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 Retinal edema  H35.81   6. Combined forms of age-related cataract of both eyes  H25.813   7. Myopia of both eyes  H52.13   8. Iritis of right eye  H20.9   9. 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 Corneal epithelial defect  H18.30   11. Hypertensive retinopathy of both eyes  H35.033   12. Dry eyes  H04.123      1-3. Lattice degeneration w/ atrophic holes, OU; OS with focal SRF/RD  - OD: atrophic hole at 1030, mild lattice with small hole at 1200  - OS: pigmented lattice at 0100 and 0430-0500--atrophic hole and +SRF at 0500  - s/p laser retinopexy OS (05.11.20), fill-in (06.05.20) -- good laser changes surrounding all lesions  - s/p laser retinopexy OD (05.18.20), fill-in (06.05.20) -- good laser changes surrounding all lesions  - f/u 9-12 months  4,5. Retinal edema, myopic retinoschisis OD  - nasal IRF/cystic macular changes extending from inf temp disc -- +intraretinal stranding  - FA (05.18.20) without hyperfluorescence/leakage suggesting not CME or optic pit -- myopic retinoschisis  -  improved from prior on OCT -- will continue to monitor  - f/u 9-12 months  6. Mixed form age related cataract OU  - The symptoms of cataract, surgical options, and treatments and risks were discussed with patient.  - discussed diagnosis and progression  - not yet visually significant  - monitor for now  7. Myopia OU  - discussed association of myopia with lattice degeneration and RT/RD and retinoschisis  - monitor    Ophthalmic Meds Ordered this visit:  No orders of the defined types were placed in this encounter.      No follow-ups on file.  There are no Patient Instructions on file for this visit.   This document serves as a record of services personally performed by Gardiner Sleeper, MD, PhD. It was created on their behalf by San Jetty. Owens Shark, OA an ophthalmic technician. The creation of this record is the provider's dictation and/or activities during the visit.    Electronically signed by: San Jetty. Owens Shark, [place] 04.27.2022 10:45 AM  Gardiner Sleeper, M.D., Ph.D. Diseases & Surgery of the Retina and Vitreous [facility]: M myopia (nearsighted); A astigmatism; H hyperopia (farsighted); P presbyopia; Mrx spectacle prescription;  CTL contact lenses; OD  right eye; OS left eye; OU both eyes  XT exotropia; ET esotropia; PEK punctate epithelial keratitis; PEE punctate epithelial erosions; DES dry eye syndrome; MGD meibomian gland dysfunction; ATs artificial tears; PFAT's preservative free artificial tears; [place] nuclear sclerotic cataract; PSC posterior subcapsular cataract; ERM epi-retinal membrane; PVD posterior vitreous detachment; RD retinal detachment; DM diabetes mellitus; DR diabetic retinopathy; NPDR non-proliferative diabetic retinopathy; PDR proliferative diabetic retinopathy; CSME clinically significant macular edema; DME diabetic macular edema; dbh dot blot hemorrhages; CWS cotton wool spot; POAG primary open angle glaucoma; C/D cup-to-disc ratio; HVF humphrey visual field; GVF goldmann visual field; OCT optical coherence tomography; IOP intraocular pressure; BRVO Branch retinal vein occlusion; CRVO  central retinal vein occlusion; CRAO central retinal artery occlusion; BRAO branch retinal artery occlusion; RT retinal tear; SB scleral buckle; PPV pars plana vitrectomy; VH Vitreous hemorrhage; PRP panretinal laser photocoagulation; IVK intravitreal kenalog; VMT vitreomacular traction; MH Macular hole;  NVD neovascularization of the disc; NVE neovascularization elsewhere; AREDS age related eye disease study; ARMD age related macular degeneration; POAG primary open angle glaucoma; EBMD epithelial/anterior basement membrane dystrophy; ACIOL anterior chamber intraocular lens; IOL intraocular lens; PCIOL posterior chamber intraocular lens; Phaco/IOL phacoemulsification with intraocular lens placement; [place] photorefractive keratectomy; LASIK laser assisted in situ keratomileusis; HTN hypertension; DM diabetes mellitus; COPD chronic obstructive pulmonary disease

## 2021-03-23 ENCOUNTER — Encounter (INDEPENDENT_AMBULATORY_CARE_PROVIDER_SITE_OTHER): Payer: 59 | Admitting: Ophthalmology

## 2021-03-23 DIAGNOSIS — H1830 Unspecified corneal membrane change: Secondary | ICD-10-CM

## 2021-03-23 DIAGNOSIS — H209 Unspecified iridocyclitis: Secondary | ICD-10-CM

## 2021-03-23 DIAGNOSIS — H3322 Serous retinal detachment, left eye: Secondary | ICD-10-CM

## 2021-03-23 DIAGNOSIS — H40053 Ocular hypertension, bilateral: Secondary | ICD-10-CM

## 2021-03-23 DIAGNOSIS — H25813 Combined forms of age-related cataract, bilateral: Secondary | ICD-10-CM

## 2021-03-23 DIAGNOSIS — H04123 Dry eye syndrome of bilateral lacrimal glands: Secondary | ICD-10-CM

## 2021-03-23 DIAGNOSIS — H33101 Unspecified retinoschisis, right eye: Secondary | ICD-10-CM

## 2021-03-23 DIAGNOSIS — H3581 Retinal edema: Secondary | ICD-10-CM

## 2021-03-23 DIAGNOSIS — H35413 Lattice degeneration of retina, bilateral: Secondary | ICD-10-CM

## 2021-03-23 DIAGNOSIS — H5213 Myopia, bilateral: Secondary | ICD-10-CM

## 2021-03-23 DIAGNOSIS — H35033 Hypertensive retinopathy, bilateral: Secondary | ICD-10-CM

## 2021-03-23 DIAGNOSIS — H33323 Round hole, bilateral: Secondary | ICD-10-CM

## 2021-04-17 ENCOUNTER — Other Ambulatory Visit: Payer: Self-pay

## 2021-04-17 NOTE — Patient Instructions (Addendum)
Blood work was ordered.     Medications changes include :  none   Your prescription(s) have been submitted to your pharmacy. Please take as directed and contact our office if you believe you are having problem(s) with the medication(s).   A bone density was ordered.    Start calcium and vitamin d daily.    Please followup in 1 year     Health Maintenance, Female Adopting a healthy lifestyle and getting preventive care are important in promoting health and wellness. Ask your health care provider about:  The right schedule for you to have regular tests and exams.  Things you can do on your own to prevent diseases and keep yourself healthy. What should I know about diet, weight, and exercise? Eat a healthy diet  Eat a diet that includes plenty of vegetables, fruits, low-fat dairy products, and lean protein.  Do not eat a lot of foods that are high in solid fats, added sugars, or sodium.   Maintain a healthy weight Body mass index (BMI) is used to identify weight problems. It estimates body fat based on height and weight. Your health care provider can help determine your BMI and help you achieve or maintain a healthy weight. Get regular exercise Get regular exercise. This is one of the most important things you can do for your health. Most adults should:  Exercise for at least 150 minutes each week. The exercise should increase your heart rate and make you sweat (moderate-intensity exercise).  Do strengthening exercises at least twice a week. This is in addition to the moderate-intensity exercise.  Spend less time sitting. Even light physical activity can be beneficial. Watch cholesterol and blood lipids Have your blood tested for lipids and cholesterol at 61 years of age, then have this test every 5 years. Have your cholesterol levels checked more often if:  Your lipid or cholesterol levels are high.  You are older than 61 years of age.  You are at high risk for heart  disease. What should I know about cancer screening? Depending on your health history and family history, you may need to have cancer screening at various ages. This may include screening for:  Breast cancer.  Cervical cancer.  Colorectal cancer.  Skin cancer.  Lung cancer. What should I know about heart disease, diabetes, and high blood pressure? Blood pressure and heart disease  High blood pressure causes heart disease and increases the risk of stroke. This is more likely to develop in people who have high blood pressure readings, are of African descent, or are overweight.  Have your blood pressure checked: ? Every 3-5 years if you are 46-15 years of age. ? Every year if you are 32 years old or older. Diabetes Have regular diabetes screenings. This checks your fasting blood sugar level. Have the screening done:  Once every three years after age 78 if you are at a normal weight and have a low risk for diabetes.  More often and at a younger age if you are overweight or have a high risk for diabetes. What should I know about preventing infection? Hepatitis B If you have a higher risk for hepatitis B, you should be screened for this virus. Talk with your health care provider to find out if you are at risk for hepatitis B infection. Hepatitis C Testing is recommended for:  Everyone born from [birth year] through 1966.  Anyone with known risk factors for hepatitis C. Sexually transmitted infections (STIs)  Get screened for STIs,  including gonorrhea and chlamydia, if: ? You are sexually active and are younger than 61 years of age. ? You are older than 61 years of age and your health care provider tells you that you are at risk for this type of infection. ? Your sexual activity has changed since you were last screened, and you are at increased risk for chlamydia or gonorrhea. Ask your health care provider if you are at risk.  Ask your health care provider about whether you are at high  risk for HIV. Your health care provider may recommend a prescription medicine to help prevent HIV infection. If you choose to take medicine to prevent HIV, you should first get tested for HIV. You should then be tested every 3 months for as long as you are taking the medicine. Pregnancy  If you are about to stop having your period (premenopausal) and you may become pregnant, seek counseling before you get pregnant.  Take 400 to 800 micrograms (mcg) of folic acid every day if you become pregnant.  Ask for birth control (contraception) if you want to prevent pregnancy. Osteoporosis and menopause Osteoporosis is a disease in which the bones lose minerals and strength with aging. This can result in bone fractures. If you are 39 years old or older, or if you are at risk for osteoporosis and fractures, ask your health care provider if you should:  Be screened for bone loss.  Take a calcium or vitamin D supplement to lower your risk of fractures.  Be given hormone replacement therapy (HRT) to treat symptoms of menopause. Follow these instructions at home: Lifestyle  Do not use any products that contain nicotine or tobacco, such as cigarettes, e-cigarettes, and chewing tobacco. If you need help quitting, ask your health care provider.  Do not use street drugs.  Do not share needles.  Ask your health care provider for help if you need support or information about quitting drugs. Alcohol use  Do not drink alcohol if: ? Your health care provider tells you not to drink. ? You are pregnant, may be pregnant, or are planning to become pregnant.  If you drink alcohol: ? Limit how much you use to 0-1 drink a day. ? Limit intake if you are breastfeeding.  Be aware of how much alcohol is in your drink. In the [place] one drink equals one 12 oz bottle of beer (355 mL), one 5 oz glass of wine (148 mL), or one 1 oz glass of hard liquor (44 mL). General instructions  Schedule regular health, dental,  and eye exams.  Stay current with your vaccines.  Tell your health care provider if: ? You often feel depressed. ? You have ever been abused or do not feel safe at home. Summary  Adopting a healthy lifestyle and getting preventive care are important in promoting health and wellness.  Follow your health care provider's instructions about healthy diet, exercising, and getting tested or screened for diseases.  Follow your health care provider's instructions on monitoring your cholesterol and blood pressure. This information is not intended to replace advice given to you by your health care provider. Make sure you discuss any questions you have with your health care provider. Document Revised: 11/04/2018 Document Reviewed: 11/04/2018 Elsevier Patient Education  2021 [employer].

## 2021-04-17 NOTE — Progress Notes (Signed)
Subjective:    Patient ID: Jeanne Parker, female    DOB: 12-29-1960, 60 y.o.   MRN: [id number]   This visit occurred during the SARS-CoV-2 public health emergency.  Safety protocols were in place, including screening questions prior to the visit, additional usage of staff PPE, and extensive cleaning of exam room while observing appropriate contact time as indicated for disinfecting solutions.    HPI She is here for a physical exam.   She denies any changes in her health.  Overall she feels great and has no concerns.  She is eating healthier and is exercising more.  Medications and allergies reviewed with patient and updated if appropriate.  Patient Active Problem List   Diagnosis Date Noted  . Osteopenia 04/18/2021  . Prediabetes 11/08/2017  . Hyperlipidemia 11/08/2017  . Vitamin D deficiency 11/06/2016  . Breast cancer of upper-inner quadrant of right female breast ([place]) 10/16/2015  . Generalized anxiety disorder 01/04/2013  . Hypothyroidism   . Sleep difficulties 01/20/2012    Current Outpatient Medications on File Prior to Visit  Medication Sig Dispense Refill  . anastrozole (ARIMIDEX) 1 MG tablet TAKE 1 TABLET BY MOUTH  DAILY 90 tablet 3  . cycloSPORINE (RESTASIS) 0.05 % ophthalmic emulsion Place into both eyes 2 (two) times daily.     Marland Kitchen latanoprost (XALATAN) 0.005 % ophthalmic solution 1 drop at bedtime.    . Multiple Vitamin (MULTIVITAMIN) capsule Take 1 capsule by mouth daily.    . timolol (TIMOPTIC) 0.5 % ophthalmic solution 1 drop 2 (two) times daily.    Marland Kitchen tretinoin (RETIN-A) 0.025 % cream Apply topically at bedtime. 45 g 0   No current facility-administered medications on file prior to visit.    Past Medical History:  Diagnosis Date  . Anxiety    situational  . 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 Cataract    OU  . History of chicken pox   . History of colon polyps   . Hypothyroid   . Personal history of  radiation therapy     Past Surgical History:  Procedure Laterality Date  . BREAST BIOPSY    . BREAST EXCISIONAL BIOPSY Left 1998  . BREAST LUMPECTOMY Right 2016  . BREAST SURGERY  1998   fibroid adenoma   . RADIOACTIVE SEED GUIDED PARTIAL MASTECTOMY WITH AXILLARY SENTINEL LYMPH NODE BIOPSY Right 11/16/2015   Procedure: RADIOACTIVE SEED GUIDED PARTIAL MASTECTOMY WITH AXILLARY SENTINEL LYMPH NODE BIOPSY;  Surgeon: Erroll Luna, MD;  Location: [facility];  Service: General;  Laterality: Right;    Social History   Socioeconomic History  . Marital status: Married    Spouse name: Not on file  . Number of children: 3  . Years of education: Not on file  . Highest education level: Not on file  Occupational History  . Occupation: Engineer, maintenance    Comment: Pharmacist, community  Tobacco Use  . Smoking status: Never Smoker  . Smokeless tobacco: Never Used  Substance and Sexual Activity  . Alcohol use: Yes    Comment: 1 to 2 glasses of wine a week   . Drug use: No  . Sexual activity: Not on file  Other Topics Concern  . Not on file  Social History Narrative  . Not on file   Social Determinants of Health   Financial Resource Strain: Not on file  Food Insecurity: Not on file  Transportation Needs: Not on file  Physical Activity: Not on file  Stress: Not on file  Social Connections: Not on file    Family History  Problem Relation Age of Onset  . Colon cancer Paternal Grandfather        died age 47  . Hypertension Mother 82  . Hyperlipidemia Mother   . Thyroid nodules Mother   . Diabetes Father 70  . Hyperlipidemia Father   . Hypertension Father   . GER disease Father   . Coronary artery disease Father        angioplasty age 74s  . Atrial fibrillation Father        on coumadin  . Gout Brother   . Thyroid cancer Sister     Review of Systems  Constitutional: Negative for chills and fever.  Eyes: Negative for visual disturbance.  Respiratory:  Negative for cough, shortness of breath and wheezing.   Cardiovascular: Negative for chest pain, palpitations and leg swelling.  Gastrointestinal: Negative for abdominal pain, blood in stool, constipation, diarrhea and nausea.       No gerd  Genitourinary: Negative for dysuria.  Musculoskeletal: Negative for arthralgias and back pain.  Skin: Negative for rash.  Neurological: Negative for light-headedness and headaches.  Psychiatric/Behavioral: Negative for dysphoric mood. The patient is nervous/anxious.        Objective:   Vitals:   04/18/21 1112  BP: 116/78  Pulse: 81  Temp: 98.2 F (36.8 C)  SpO2: 99%   Filed Weights   04/18/21 1112  Weight: 135 lb 3.2 oz (61.3 kg)   Body mass index is 22.5 kg/m.  BP Readings from Last 3 Encounters:  04/18/21 116/78  08/22/20 123/84  12/10/19 120/78    Wt Readings from Last 3 Encounters:  04/18/21 135 lb 3.2 oz (61.3 kg)  08/22/20 132 lb 6.4 oz (60.1 kg)  12/10/19 134 lb (60.8 kg)    Depression screen [facility] 2/9 04/18/2021 12/10/2019 12/01/2018 11/07/2017 03/01/2016  Decreased Interest 0 0 0 0 0  Down, Depressed, Hopeless 0 0 0 0 0  PHQ - 2 Score 0 0 0 0 0  Altered sleeping 0 - - - -  Tired, decreased energy 0 - - - -  Change in appetite 0 - - - -  Feeling bad or failure about yourself  0 - - - -  Trouble concentrating 0 - - - -  Moving slowly or fidgety/restless 0 - - - -  Suicidal thoughts 0 - - - -  PHQ-9 Score 0 - - - -    GAD 7 : Generalized Anxiety Score 04/18/2021  Nervous, Anxious, on Edge 0  Control/stop worrying 0  Worry too much - different things 0  Trouble relaxing 0  Restless 0  Easily annoyed or irritable 0  Afraid - awful might happen 0  Total GAD 7 Score 0         Physical Exam Constitutional: She appears well-developed and well-nourished. No distress.  HENT:  Head: Normocephalic and atraumatic.  Right Ear: External ear normal. Normal ear canal and TM Left Ear: External ear normal.  Normal ear canal  and TM Mouth/Throat: Oropharynx is clear and moist.  Eyes: Conjunctivae and EOM are normal.  Neck: Neck supple. No tracheal deviation present. No thyromegaly present.  No carotid bruit  Cardiovascular: Normal rate, regular rhythm and normal heart sounds.   No murmur heard.  No edema. Pulmonary/Chest: Effort normal and breath sounds normal. No respiratory distress. She has no wheezes. She has no rales.  Breast: deferred  Abdominal: Soft. She exhibits no distension. There is no tenderness.  Lymphadenopathy: She has no cervical adenopathy.  Skin: Skin is warm and dry. She is not diaphoretic.  Psychiatric: She has a normal mood and affect. Her behavior is normal.   The 10-year ASCVD risk score Mikey Bussing DC Jr., et al., 2013) is: 3.2%   Values used to calculate the score:     Age: 1 years     Sex: Female     Is Non-Hispanic African American: No     Diabetic: No     Tobacco smoker: No     Systolic Blood Pressure: 413 mmHg     Is BP treated: No     HDL Cholesterol: 63.2 mg/dL     Total Cholesterol: 274 mg/dL      Assessment & Plan:   Physical exam: Screening blood work    ordered Immunizations  discussed covid vaccine, others up to date Colonoscopy  Up to date  Mammogram  Up to date  Gyn  Up to date  Eye exams  Up to date  Dexa:  Due - ordered Exercise  Regular - running, hiking, pilates Weight  normal Substance abuse  none eating healthy Sees dermatology annually   Screened for depression using the PHQ 9 scale.  No evidence of depression.      Screened for anxiety using GAD7 Scale.  No evidence of anxiety.    See Problem List for Assessment and Plan of chronic medical problems.

## 2021-04-18 ENCOUNTER — Ambulatory Visit (INDEPENDENT_AMBULATORY_CARE_PROVIDER_SITE_OTHER): Payer: 59 | Admitting: Internal Medicine

## 2021-04-18 ENCOUNTER — Encounter: Payer: Self-pay | Admitting: Internal Medicine

## 2021-04-18 VITALS — BP 116/78 | HR 81 | Temp 98.2°F | Ht 65.0 in | Wt 135.2 lb

## 2021-04-18 DIAGNOSIS — F411 Generalized anxiety disorder: Secondary | ICD-10-CM

## 2021-04-18 DIAGNOSIS — G479 Sleep disorder, unspecified: Secondary | ICD-10-CM

## 2021-04-18 DIAGNOSIS — R7303 Prediabetes: Secondary | ICD-10-CM | POA: Diagnosis not present

## 2021-04-18 DIAGNOSIS — Z0000 Encounter for general adult medical examination without abnormal findings: Secondary | ICD-10-CM

## 2021-04-18 DIAGNOSIS — M8580 Other specified disorders of bone density and structure, unspecified site: Secondary | ICD-10-CM | POA: Insufficient documentation

## 2021-04-18 DIAGNOSIS — E782 Mixed hyperlipidemia: Secondary | ICD-10-CM

## 2021-04-18 DIAGNOSIS — M85851 Other specified disorders of bone density and structure, right thigh: Secondary | ICD-10-CM

## 2021-04-18 DIAGNOSIS — E039 Hypothyroidism, unspecified: Secondary | ICD-10-CM | POA: Diagnosis not present

## 2021-04-18 DIAGNOSIS — E559 Vitamin D deficiency, unspecified: Secondary | ICD-10-CM

## 2021-04-18 MED ORDER — LEVOTHYROXINE SODIUM 75 MCG PO TABS: 75.0000 ug | ORAL_TABLET | Freq: Every day | ORAL | 3 refills | Status: DC

## 2021-04-18 MED ORDER — ESZOPICLONE 2 MG PO TABS: ORAL_TABLET | ORAL | 0 refills | Status: DC

## 2021-04-18 MED ORDER — CITALOPRAM HYDROBROMIDE 20 MG PO TABS: 20.0000 mg | ORAL_TABLET | Freq: Every day | ORAL | 3 refills | Status: DC

## 2021-04-18 NOTE — Assessment & Plan Note (Signed)
Chronic Check a1c Low sugar / carb diet Stressed regular exercise  

## 2021-04-18 NOTE — Assessment & Plan Note (Signed)
Chronic  Clinically euthyroid Currently taking levothyroxine 75 mcg Check tsh  Titrate med dose if needed

## 2021-04-18 NOTE — Assessment & Plan Note (Signed)
Chronic Controlled, stable Continue lunesta 2 mg HS prn - does not take nightly

## 2021-04-18 NOTE — Assessment & Plan Note (Signed)
Chronic °Controlled, stable °Continue celexa 20 mg daily ° °

## 2021-04-18 NOTE — Assessment & Plan Note (Addendum)
Chronic Last dexa 2018 - due now On anastrozole Will order dexa Exercising regularly  Taking MVI intermittently, vitamin d - advised taking calcium and vitamin d daily

## 2021-04-18 NOTE — Assessment & Plan Note (Addendum)
Chronic Check lipid panel  Lifestyle controlled Continue regular exercise and healthy diet

## 2021-04-18 NOTE — Assessment & Plan Note (Signed)
Chronic Taking vitamin D daily Check vitamin D level  

## 2021-04-19 ENCOUNTER — Other Ambulatory Visit: Payer: Self-pay

## 2021-04-19 ENCOUNTER — Other Ambulatory Visit (INDEPENDENT_AMBULATORY_CARE_PROVIDER_SITE_OTHER): Payer: 59

## 2021-04-19 DIAGNOSIS — E782 Mixed hyperlipidemia: Secondary | ICD-10-CM

## 2021-04-19 DIAGNOSIS — Z0000 Encounter for general adult medical examination without abnormal findings: Secondary | ICD-10-CM

## 2021-04-19 DIAGNOSIS — R7303 Prediabetes: Secondary | ICD-10-CM

## 2021-04-19 DIAGNOSIS — E559 Vitamin D deficiency, unspecified: Secondary | ICD-10-CM | POA: Diagnosis not present

## 2021-04-19 DIAGNOSIS — E039 Hypothyroidism, unspecified: Secondary | ICD-10-CM | POA: Diagnosis not present

## 2021-04-19 LAB — LIPID PANEL
Cholesterol: 271 mg/dL — ABNORMAL HIGH (ref 0–200)
HDL: 66.5 mg/dL (ref >39.00)
LDL Cholesterol: 187 mg/dL — ABNORMAL HIGH (ref 0–99)
NonHDL: 204.6
Total CHOL/HDL Ratio: 4
Triglycerides: 89 mg/dL (ref 0.0–149.0)
VLDL: 17.8 mg/dL (ref 0.0–40.0)

## 2021-04-19 LAB — COMPREHENSIVE METABOLIC PANEL
ALT: 16 U/L (ref 0–35)
AST: 17 U/L (ref 0–37)
Albumin: 4.6 g/dL (ref 3.5–5.2)
Alkaline Phosphatase: 37 U/L — ABNORMAL LOW (ref 39–117)
BUN: 11 mg/dL (ref 6–23)
CO2: 29 mEq/L (ref 19–32)
Calcium: 9.8 mg/dL (ref 8.4–10.5)
Chloride: 103 mEq/L (ref 96–112)
Creatinine, Ser: 0.73 mg/dL (ref 0.40–1.20)
GFR: 89.34 mL/min (ref >60.00)
Glucose, Bld: 87 mg/dL (ref 70–99)
Potassium: 4.4 mEq/L (ref 3.5–5.1)
Sodium: 138 mEq/L (ref 135–145)
Total Bilirubin: 0.4 mg/dL (ref 0.2–1.2)
Total Protein: 7 g/dL (ref 6.0–8.3)

## 2021-04-19 LAB — CBC WITH DIFFERENTIAL/PLATELET
Basophils Absolute: 0 10*3/uL (ref 0.0–0.1)
Basophils Relative: 0.6 % (ref 0.0–3.0)
Eosinophils Absolute: 0.1 10*3/uL (ref 0.0–0.7)
Eosinophils Relative: 3.9 % (ref 0.0–5.0)
HCT: 37.7 % (ref 36.0–46.0)
Hemoglobin: 12.9 g/dL (ref 12.0–15.0)
Lymphocytes Relative: 36.6 % (ref 12.0–46.0)
Lymphs Abs: 1.2 10*3/uL (ref 0.7–4.0)
MCHC: 34.2 g/dL (ref 30.0–36.0)
MCV: 91.8 fl (ref 78.0–100.0)
Monocytes Absolute: 0.4 10*3/uL (ref 0.1–1.0)
Monocytes Relative: 11.3 % (ref 3.0–12.0)
Neutro Abs: 1.6 10*3/uL (ref 1.4–7.7)
Neutrophils Relative %: 47.6 % (ref 43.0–77.0)
Platelets: 269 10*3/uL (ref 150.0–400.0)
RBC: 4.1 Mil/uL (ref 3.87–5.11)
RDW: 13.7 % (ref 11.5–15.5)
WBC: 3.3 10*3/uL — ABNORMAL LOW (ref 4.0–10.5)

## 2021-04-19 LAB — TSH: TSH: 1.35 u[IU]/mL (ref 0.35–4.50)

## 2021-04-19 LAB — HEMOGLOBIN A1C: Hgb A1c MFr Bld: 5.8 % (ref 4.6–6.5)

## 2021-04-19 LAB — VITAMIN D 25 HYDROXY (VIT D DEFICIENCY, FRACTURES): VITD: 26.14 ng/mL — ABNORMAL LOW (ref 30.00–100.00)

## 2021-05-15 ENCOUNTER — Other Ambulatory Visit: Payer: Self-pay | Admitting: Internal Medicine

## 2021-06-07 ENCOUNTER — Encounter: Payer: Self-pay | Admitting: Internal Medicine

## 2021-06-07 ENCOUNTER — Other Ambulatory Visit (HOSPITAL_COMMUNITY): Payer: Self-pay

## 2021-06-07 ENCOUNTER — Encounter: Payer: Self-pay | Admitting: Family Medicine

## 2021-06-07 ENCOUNTER — Telehealth (INDEPENDENT_AMBULATORY_CARE_PROVIDER_SITE_OTHER): Payer: 59 | Admitting: Family Medicine

## 2021-06-07 VITALS — Temp 101.0°F | Wt 129.0 lb

## 2021-06-07 DIAGNOSIS — U071 COVID-19: Secondary | ICD-10-CM

## 2021-06-07 MED ORDER — NIRMATRELVIR/RITONAVIR (PAXLOVID)TABLET
3.0000 | ORAL_TABLET | Freq: Two times a day (BID) | ORAL | 0 refills | Status: AC
  Filled 2021-06-07: qty 30, 5d supply, fill #0

## 2021-06-07 MED ORDER — BENZONATATE 100 MG PO CAPS
100.0000 mg | ORAL_CAPSULE | Freq: Three times a day (TID) | ORAL | 0 refills | Status: DC | PRN
  Filled 2021-06-07: qty 20, 7d supply, fill #0

## 2021-06-07 NOTE — Progress Notes (Signed)
Virtual Visit via Video Note  I connected with Jeanne Parker  on 06/07/21 at 11:20 AM EDT by a video enabled telemedicine application and verified that I am speaking with the correct person using two identifiers.  Location patient: home, [place] Location provider:work or home office Persons participating in the virtual visit: patient, provider  I discussed the limitations of evaluation and management by telemedicine and the availability of in person appointments. The patient expressed understanding and agreed to proceed.   HPI:  Acute telemedicine visit for covid19: -Onset: 2 days ago; tested positive of a home test -Symptoms include: nasal congestion, cough, fever -Denies: CP, SOB, NVD, inability to eat/drink/get out of bed -Has tried: robitussin -Pertinent past medical history:  see below -Pertinent medication allergies: No Known Allergies -COVID-19 vaccine status: has had 2 doses + 2 boosters (2nd booster in June) -had recent labs with GFR > 80  ROS: See pertinent positives and negatives per HPI.  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   Cataract    OU   History of chicken pox    History of colon polyps    Hypothyroid    Personal history of radiation therapy     Past Surgical History:  Procedure Laterality Date   BREAST BIOPSY     BREAST EXCISIONAL BIOPSY Left 1998   BREAST LUMPECTOMY Right 2016   BREAST SURGERY  1998   fibroid adenoma    RADIOACTIVE SEED GUIDED PARTIAL MASTECTOMY WITH AXILLARY SENTINEL LYMPH NODE BIOPSY Right 11/16/2015   Procedure: RADIOACTIVE SEED GUIDED PARTIAL MASTECTOMY WITH AXILLARY SENTINEL LYMPH NODE BIOPSY;  Surgeon: Erroll Luna, MD;  Location: [facility];  Service: General;  Laterality: Right;     Current Outpatient Medications:    anastrozole (ARIMIDEX) 1 MG tablet, TAKE 1 TABLET BY MOUTH  DAILY, Disp: 90 tablet, Rfl: 3    benzonatate (TESSALON PERLES) 100 MG capsule, Take 1 capsule (100 mg total) by mouth 3 (three) times daily as needed., Disp: 20 capsule, Rfl: 0   citalopram (CELEXA) 20 MG tablet, TAKE 1 TABLET BY MOUTH DAILY, Disp: 90 tablet, Rfl: 3   cycloSPORINE (RESTASIS) 0.05 % ophthalmic emulsion, Place into both eyes 2 (two) times daily. , Disp: , Rfl:    eszopiclone (LUNESTA) 2 MG TABS tablet, TAKE 1 TABLET BY MOUTH AT  BEDTIME AS NEEDED FOR SLEEP IMMEDIATELY BEFORE BEDTIME. (Patient taking differently: Take 1 mg by mouth at bedtime as needed. TAKE 1 TABLET BY MOUTH AT  BEDTIME AS NEEDED FOR SLEEP IMMEDIATELY BEFORE BEDTIME.), Disp: 90 tablet, Rfl: 0   latanoprost (XALATAN) 0.005 % ophthalmic solution, 1 drop at bedtime., Disp: , Rfl:    levothyroxine (SYNTHROID) 75 MCG tablet, Take 1 tablet (75 mcg total) by mouth daily before breakfast., Disp: 90 tablet, Rfl: 3   Multiple Vitamin (MULTIVITAMIN) capsule, Take 1 capsule by mouth daily., Disp: , Rfl:    nirmatrelvir/ritonavir EUA (PAXLOVID) TABS, Take 3 tablets by mouth 2 (two) times daily for 5 days. (Take nirmatrelvir 150 mg two tablets twice daily for 5 days and ritonavir 100 mg one tablet twice daily for 5 days) Patient GFR is 89, Disp: 30 tablet, Rfl: 0   tretinoin (RETIN-A) 0.025 % cream, Apply topically at bedtime., Disp: 45 g, Rfl: 0  EXAM:  VITALS per patient if applicable:  GENERAL: alert, oriented, appears well and in no acute distress  HEENT:  atraumatic, conjunttiva clear, no obvious abnormalities on inspection of external nose and ears  NECK: normal movements of the head and neck  LUNGS: on inspection no signs of respiratory distress, breathing rate appears normal, no obvious gross SOB, gasping or wheezing  CV: no obvious cyanosis  MS: moves all visible extremities without noticeable abnormality  PSYCH/NEURO: pleasant and cooperative, no obvious depression or anxiety, speech and thought processing grossly intact  ASSESSMENT AND  PLAN:  Discussed the following assessment and plan:  COVID-19   Discussed treatment options, ideal treatment window, potential complications, isolation and precautions for COVID-19.  After lengthy discussion, the patient opted for treatment with Paxlovid due to being higher risk for complications of covid or severe disease and other factors. Discussed EUA status of this drug and the fact that there is preliminary limited knowledge of risks/interactions/side effects per EUA document vs possible benefits and precautions. Ran her drugs through the covid drug [place] interaction check and reviewed the fda and [place] interaction sheets. Advised she check with pharmacisit on the Arimidex, but did not see any interaction listed for this drugh. This information was shared with patient during the visit and also was provided in patient instructions. Also, advised that patient discuss risks/interactions and use with pharmacist/treatment team as well. The patient did want a prescription for cough, Tessalon Rx sent.  Other symptomatic care measures summarized in patient instructions.  Advised to seek prompt in person care if worsening, new symptoms arise, or if is not improving with treatment. Discussed options for inperson care if PCP office not available. Did let this patient know that I only do telemedicine on Tuesdays and Thursdays for [facility]. Advised to schedule follow up visit with PCP or [facility] if any further questions or concerns to avoid delays in care.   I discussed the assessment and treatment plan with the patient. The patient was provided an opportunity to ask questions and all were answered. The patient agreed with the plan and demonstrated an understanding of the instructions.     Lucretia Kern, DO

## 2021-06-07 NOTE — Patient Instructions (Signed)
HOME CARE TIPS:  -I sent the medication(s) we discussed to your pharmacy: Meds ordered this encounter  Medications   nirmatrelvir/ritonavir EUA (PAXLOVID) TABS    Sig: Take 3 tablets by mouth 2 (two) times daily for 5 days. (Take nirmatrelvir 150 mg two tablets twice daily for 5 days and ritonavir 100 mg one tablet twice daily for 5 days) Patient GFR is 89    Dispense:  30 tablet    Refill:  0   benzonatate (TESSALON PERLES) 100 MG capsule    Sig: Take 1 capsule (100 mg total) by mouth 3 (three) times daily as needed.    Dispense:  20 capsule    Refill:  0     -I sent in the [place] treatment or referral you requested per our discussion. Please see the information provided below and discuss further with the pharmacist/treatment team.   -can use tylenol if needed for fevers, aches and pains per instructions  -can use nasal saline a few times per day if you have nasal congestion  -stay hydrated, drink plenty of fluids and eat small healthy meals - avoid dairy  -If the Covid test is positive, check out the [facility] website for more information on home care, transmission and treatment for COVID19  -follow up with your doctor in 2-3 days unless improving and feeling better  -stay home while sick, except to seek medical care. If you have COVID19, ideally it would be best to stay home for a full 10 days since the onset of symptoms PLUS one day of no fever and feeling better. Wear a good mask that fits snugly (such as N95 or KN95) if around others to reduce the risk of transmission.  It was nice to meet you today, and I really hope you are feeling better soon. I help [facility] [place] out with telemedicine visits on Tuesdays and Thursdays and am available for visits on those days. If you have any concerns or questions following this visit please schedule a follow up visit with your Primary Care doctor or seek care at a local urgent care clinic to avoid delays in care.    Seek in person care or schedule  a follow up video visit promptly if your symptoms worsen, new concerns arise or you are not improving with treatment. Call 911 and/or seek emergency care if your symptoms are severe or life threatening.  FACT SHEET FOR PATIENTS, PARENTS, AND CAREGIVERS EMERGENCY USE AUTHORIZATION (EUA) OF PAXLOVID FOR CORONAVIRUS DISEASE 2019 (COVID-19) You are being given this Fact Sheet because your healthcare provider believes it is necessary to provide you with PAXLOVID for the treatment of mild-to-moderate coronavirus disease (COVID-19) caused by the SARS-CoV-2 virus. This Fact Sheet contains information to help you understand the risks and benefits of taking the PAXLOVID you have received or may receive. The U.S. Food and Drug Administration (FDA) has issued an Emergency Use Authorization (EUA) to make PAXLOVID available during the COVID-19 pandemic (for more details about an EUA please see "What is an Emergency Use Authorization?" at the end of this document). PAXLOVID is not an FDA-approved medicine in the [place]. Read this Fact Sheet for information about PAXLOVID. Talk to your healthcare provider about your options or if you have any questions. It is your choice to take PAXLOVID.  What is COVID-19? COVID-19 is caused by a virus called a coronavirus. You can get COVID-19 through close contact with another person who has the virus. COVID-19 illnesses have ranged from very mild-to-severe, including illness resulting  in death. While information so far suggests that most COVID-19 illness is mild, serious illness can happen and may cause some of your other medical conditions to become worse. Older people and people of all ages with severe, long lasting (chronic) medical conditions like heart disease, lung disease, and diabetes, for example seem to be at higher risk of being hospitalized for COVID-19.  What is PAXLOVID? PAXLOVID is an investigational medicine used to treat mild-to-moderate  COVID-19 in adults and children [67 years of age and older weighing at least 69 pounds (26 kg)] with positive results of direct SARS-CoV-2 viral testing, and who are at high risk for progression to severe COVID-19, including hospitalization or death. PAXLOVID is investigational because it is still being studied. There is limited information about the safety and effectiveness of using PAXLOVID to treat people with mild-to-moderate COVID-19.  The FDA has authorized the emergency use of PAXLOVID for the treatment of mild-tomoderate COVID-19 in adults and children [30 years of age and older weighing at least 55 pounds (35 kg)] with a positive test for the virus that causes COVID-19, and who are at high risk for progression to severe COVID-19, including hospitalization or death, under an EUA. 1 Revised: 09 February 2021   What should I tell my healthcare provider before I take PAXLOVID? Tell your healthcare provider if you: ? Have any allergies ? Have liver or kidney disease ? Are pregnant or plan to become pregnant ? Are breastfeeding a child ? Have any serious illnesses  Tell your healthcare provider about all the medicines you take, including prescription and over-the-counter medicines, vitamins, and herbal supplements. Some medicines may interact with PAXLOVID and may cause serious side effects. Keep a list of your medicines to show your healthcare provider and pharmacist when you get a new medicine.  You can ask your healthcare provider or pharmacist for a list of medicines that interact with PAXLOVID. Do not start taking a new medicine without telling your healthcare provider. Your healthcare provider can tell you if it is safe to take PAXLOVID with other medicines.  Tell your healthcare provider if you are taking combined hormonal contraceptive. PAXLOVID may affect how your birth control pills work. Females who are able to become pregnant should use another effective alternative  form of contraception or an additional barrier method of contraception. Talk to your healthcare provider if you have any questions about contraceptive methods that might be right for you.  How do I take PAXLOVID? ? PAXLOVID consists of 2 medicines: nirmatrelvir and ritonavir. o Take 2 pink tablets of nirmatrelvir with 1 white tablet of ritonavir by mouth 2 times each day (in the morning and in the evening) for 5 days. For each dose, take all 3 tablets at the same time. o If you have kidney disease, talk to your healthcare provider. You may need a different dose. ? Swallow the tablets whole. Do not chew, break, or crush the tablets. ? Take PAXLOVID with or without food. ? Do not stop taking PAXLOVID without talking to your healthcare provider, even if you feel better. ? If you miss a dose of PAXLOVID within 8 hours of the time it is usually taken, take it as soon as you remember. If you miss a dose by more than 8 hours, skip the missed dose and take the next dose at your regular time. Do not take 2 doses of PAXLOVID at the same time. ? If you take too much PAXLOVID, call your healthcare provider or go to  the nearest hospital emergency room right away. ? If you are taking a ritonavir- or cobicistat-containing medicine to treat hepatitis C or Human Immunodeficiency Virus (HIV), you should continue to take your medicine as prescribed by your healthcare provider. 2 Revised: 09 February 2021    Talk to your healthcare provider if you do not feel better or if you feel worse after 5 days.  Who should generally not take PAXLOVID? Do not take PAXLOVID if: ? You are allergic to nirmatrelvir, ritonavir, or any of the ingredients in PAXLOVID. ? You are taking any of the following medicines: o Alfuzosin o Pethidine, propoxyphene o Ranolazine o Amiodarone, dronedarone, flecainide, propafenone, quinidine o Colchicine o Lurasidone, pimozide, clozapine o Dihydroergotamine, ergotamine,  methylergonovine o Lovastatin, simvastatin o Sildenafil (Revatio) for pulmonary arterial hypertension (PAH) o Triazolam, oral midazolam o Apalutamide o Carbamazepine, phenobarbital, phenytoin o Rifampin o St. John's Wort (hypericum perforatum) Taking PAXLOVID with these medicines may cause serious or life-threatening side effects or affect how PAXLOVID works.  These are not the only medicines that may cause serious side effects if taken with PAXLOVID. PAXLOVID may increase or decrease the levels of multiple other medicines. It is very important to tell your healthcare provider about all of the medicines you are taking because additional laboratory tests or changes in the dose of your other medicines may be necessary while you are taking PAXLOVID. Your healthcare provider may also tell you about specific symptoms to watch out for that may indicate that you need to stop or decrease the dose of some of your other medicines.  What are the important possible side effects of PAXLOVID? Possible side effects of PAXLOVID are: ? Allergic Reactions. Allergic reactions can happen in people taking PAXLOVID, even after only 1 dose. Stop taking PAXLOVID and call your healthcare provider right away if you get any of the following symptoms of an allergic reaction: o hives o trouble swallowing or breathing o swelling of the mouth, lips, or face o throat tightness o hoarseness 3 Revised: 09 February 2021  o skin rash ? Liver Problems. Tell your healthcare provider right away if you have any of these signs and symptoms of liver problems: loss of appetite, yellowing of your skin and the whites of eyes (jaundice), dark-colored urine, pale colored stools and itchy skin, stomach area (abdominal) pain. ? Resistance to HIV Medicines. If you have untreated HIV infection, PAXLOVID may lead to some HIV medicines not working as well in the future. ? Other possible side effects include: o altered sense of  taste o diarrhea o high blood pressure o muscle aches These are not all the possible side effects of PAXLOVID. Not many people have taken PAXLOVID. Serious and unexpected side effects may happen. PAXLOVID is still being studied, so it is possible that all of the risks are not known at this time.  What other treatment choices are there? Veklury (remdesivir) is FDA-approved for the treatment of mild-to-moderate QQIWL-79 in certain adults and children. Talk with your doctor to see if Marijean Heath is appropriate for you. Like PAXLOVID, FDA may also allow for the emergency use of other medicines to treat people with COVID-19. Go to https://price.info/ for information on the emergency use of other medicines that are authorized by FDA to treat people with COVID-19. Your healthcare provider may talk with you about clinical trials for which you may be eligible. It is your choice to be treated or not to be treated with PAXLOVID. Should you decide not to receive it or for  your child not to receive it, it will not change your standard medical care.  What if I am pregnant or breastfeeding? There is no experience treating pregnant women or breastfeeding mothers with PAXLOVID. For a mother and unborn baby, the benefit of taking PAXLOVID may be greater than the risk from the treatment. If you are pregnant, discuss your options and specific situation with your healthcare provider. It is recommended that you use effective barrier contraception or do not have sexual activity while taking PAXLOVID. If you are breastfeeding, discuss your options and specific situation with your healthcare provider. 4 Revised: 09 February 2021   How do I report side effects with PAXLOVID? Contact your healthcare provider if you have any side effects that bother you or do not go away. Report side effects to FDA MedWatch at  SmoothH[employer]hu or call 1-800-FDA1088 or you can report side effects to [employer]. at the contact information provided below. Website Fax number Telephone number www.pfizersafetyreporting.com [phone] How should I store [place]? Store PAXLOVID tablets at room temperature, between 68?F to 77?F (20?C to 25?C). How can I learn more about COVID-19? ? Ask your healthcare provider. ? Visit [email or web address] ? Contact your local or state public health department. What is an Emergency Use Authorization (EUA)? The [place] FDA has made PAXLOVID available under an emergency access mechanism called an Emergency Use Authorization (EUA). The EUA is supported by a Education officer, museum and Human Service (HHS) declaration that circumstances exist to justify the emergency use of drugs and biological products during the COVID-19 pandemic. PAXLOVID for the treatment of mild-to-moderate COVID-19 in adults and children [86 years of age and older weighing at least 64 pounds (40 kg)] with positive results of direct SARS-CoV-2 viral testing, and who are at high risk for progression to severe COVID-19, including hospitalization or death, has not undergone the same type of review as an FDA-approved product. In issuing an EUA under the DYJWL-29 public health emergency, the FDA has determined, among other things, that based on the total amount of scientific evidence available including data from adequate and well-controlled clinical trials, if available, it is reasonable to believe that the product may be effective for diagnosing, treating, or preventing COVID-19, or a serious or life-threatening disease or condition caused by COVID-19; that the known and potential benefits of the product, when used to diagnose, treat, or prevent such disease or condition, outweigh the known and potential risks of such product; and that there are no adequate, approved, and available  alternatives. All of these criteria must be met to allow for the product to be used in the treatment of patients during the COVID-19 pandemic. The EUA for PAXLOVID is in effect for the duration of the COVID-19 declaration justifying emergency use of this product, unless terminated or revoked (after which the products may no longer be used under the EUA). 5 Revised: 09 February 2021     Additional Information For general questions, visit the website or call the telephone number provided below. Website Telephone number www.COVID19oralRx.com [phone] (1-877-C19-PACK) You can also go to [email or web address].com or call [phone] for more information. [id number] Revised: 09 February 2021

## 2021-06-19 NOTE — Progress Notes (Addendum)
[facility] Note  06/22/2021     CHIEF COMPLAINT Patient presents for Retina Follow Up   HISTORY OF PRESENT ILLNESS: Jeanne Parker is a 60 y.o. female who presents to the clinic today for:   HPI     Retina Follow Up   Patient presents with  Retinal Break/Detachment (Lattice degeneration).  In both eyes.  Severity is moderate.  Duration of 15 months.  Since onset it is stable.  I, the attending physician,  performed the HPI with the patient and updated documentation appropriately.        Comments   Patient states no new floaters or flashes. Vision cloudy OD for the past 6 months. On latanoprost qhs OU per Dr. Satira Sark.      Last edited by Bernarda Caffey, MD on 06/22/2021 11:50 PM.    VA OU blurred and pt experiencing glare due to cataracts  Referring physician: Marygrace Drought, MD [place],  [place] [address]  HISTORICAL INFORMATION:   Selected notes from the MEDICAL RECORD NUMBER Referred by Dr. Wyatt Portela for concern of iritis, operculated hole LEE: 05.11.20 (S. [place])   Ocular Hx- PMH-anxiety, breast cancer, hypothyroid   CURRENT MEDICATIONS: Current Outpatient Medications (Ophthalmic Drugs)  Medication Sig   cycloSPORINE (RESTASIS) 0.05 % ophthalmic emulsion Place into both eyes 2 (two) times daily.    latanoprost (XALATAN) 0.005 % ophthalmic solution 1 drop at bedtime.   No current facility-administered medications for this visit. (Ophthalmic Drugs)   Current Outpatient Medications (Other)  Medication Sig   anastrozole (ARIMIDEX) 1 MG tablet TAKE 1 TABLET BY MOUTH  DAILY   benzonatate (TESSALON PERLES) 100 MG capsule Take 1 capsule (100 mg total) by mouth 3 (three) times daily as needed.   citalopram (CELEXA) 20 MG tablet TAKE 1 TABLET BY MOUTH DAILY   levothyroxine (SYNTHROID) 75 MCG tablet Take 1 tablet (75 mcg total) by mouth daily before breakfast.   Multiple Vitamin (MULTIVITAMIN) capsule Take 1 capsule by mouth  daily.   tretinoin (RETIN-A) 0.025 % cream Apply topically at bedtime.   eszopiclone (LUNESTA) 2 MG TABS tablet TAKE 1 TABLET BY MOUTH AT  BEDTIME AS NEEDED FOR SLEEP IMMEDIATELY BEFORE BEDTIME. (Patient not taking: Reported on 06/22/2021)   No current facility-administered medications for this visit. (Other)      REVIEW OF SYSTEMS: ROS   Positive for: Endocrine, Eyes Negative for: Constitutional, Gastrointestinal, Neurological, Skin, Genitourinary, Musculoskeletal, HENT, Cardiovascular, Respiratory, Psychiatric, Allergic/Imm, Heme/Lymph Last edited by Roselee Nova D, COT on 06/22/2021  2:47 PM.       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/16/15   Cataract    OU   History of chicken pox    History of colon polyps    Hypothyroid    Personal history of radiation therapy    Past Surgical History:  Procedure Laterality Date   BREAST BIOPSY     BREAST EXCISIONAL BIOPSY Left 1998   BREAST LUMPECTOMY Right 2016   BREAST SURGERY  1998   fibroid adenoma    RADIOACTIVE SEED GUIDED PARTIAL MASTECTOMY WITH AXILLARY SENTINEL LYMPH NODE BIOPSY Right 11/16/2015   Procedure: RADIOACTIVE SEED GUIDED PARTIAL MASTECTOMY WITH AXILLARY SENTINEL LYMPH NODE BIOPSY;  Surgeon: Erroll Luna, MD;  Location: [facility];  Service: General;  Laterality:  Right;    FAMILY HISTORY Family History  Problem Relation Age of Onset   Colon cancer Paternal Grandfather        died age 23   Hypertension Mother 38   Hyperlipidemia Mother    Thyroid nodules Mother    Diabetes Father 60   Hyperlipidemia Father    Hypertension Father    GER disease Father    Coronary artery disease Father        angioplasty age 61s   Atrial fibrillation Father        on coumadin   Gout Brother    Thyroid cancer Sister     SOCIAL HISTORY Social History    Tobacco Use   Smoking status: Never   Smokeless tobacco: Never  Substance Use Topics   Alcohol use: Yes    Comment: 1 to 2 glasses of wine a week    Drug use: No         Base Eye Exam     Visual Acuity (Snellen - Linear)       Right Left   Dist cc 20/50 +1 20/30 +2   Dist ph cc 20/40 +2 20/25 -1    Correction: Glasses         Tonometry (Tonopen, 2:50 PM)       Right Left   Pressure 15 14         Pupils       Dark Light Shape React APD   Right 6 5 Round Brisk None   Left 6 5 Round Brisk None         Visual Fields (Counting fingers)       Left Right    Full Full         Extraocular Movement       Right Left    Full, Ortho Full, Ortho         Neuro/Psych     Oriented x3: Yes   Mood/Affect: Normal         Dilation     Both eyes: 1.0% Mydriacyl, 2.5% Phenylephrine @ 2:50 PM           Slit Lamp and Fundus Exam     Slit Lamp Exam       Right Left   Lids/Lashes mild Meibomian gland dysfunction, dermato mild Meibomian gland dysfunction, dermato   Conjunctiva/Sclera White and quiet White and quiet   Cornea mild Arcus, trace Punctate epithelial erosions, Debris in tear film mild Arcus, trace Punctate epithelial erosions   Anterior Chamber Deep and quiet deep and clear   Iris Round and well dilated Round and well dilated   Lens 2-3+ Nuclear sclerosis, 2+ Cortical cataract, 1+ Posterior subcapsular cataract 2-3+ Nuclear sclerosis, 2+ Cortical cataract   Vitreous Vitreous syneresis, +pigment in anterior vitreous, Posterior vitreous detachment Vitreous syneresis         Fundus Exam       Right Left   Disc Pink and Sharp, mild tilt, Peripapillary atrophy, Compact Tilted disc, temporal Peripapillary atrophy, Pink and Sharp, Compact   C/D Ratio 0.4 0.6   Macula Flat, blunted foveal reflex, trace ERM, mild Retinal pigment epithelial mottling, shallow cystic changes / retinoschisis nasal macula--improved Flat, blunted foveal reflex, mild  Retinal pigment epithelial mottling, No heme or edema   Vessels mild Vascular attenuation mild Vascular attenuation   Periphery Attached, superior lattice w/ atrophic holes at 1030 w/+SRF and 1200 -- good laser surrounding; focal areas of pigment clumping at 0600 and 0700  Attached, pigmented lattice at 0100 and 0430-0600; atrophic hole w/ mild cuff of SRF at 0500 -- good laser surrounding all lesions.  No new RT/RD.           Refraction     Wearing Rx       Sphere Cylinder Axis   Right -5.25 +0.50 130   Left -5.50 +0.50 117         Manifest Refraction       Sphere Cylinder Axis Dist VA   Right -5.75 +0.75 133 20/40   Left -5.50 +0.50 110 20/30              IMAGING AND PROCEDURES  Imaging and Procedures for '@TODAY'$ @  OCT, Retina - OU - Both Eyes       Right Eye Quality was good. Central Foveal Thickness: 245. Progression has improved. Findings include abnormal foveal contour, intraretinal fluid, no SRF, myopic contour (Interval improvement in nasal retinoschisis, focal shallow SRF caught on widefield).   Left Eye Quality was good. Central Foveal Thickness: 260. Progression has been stable. Findings include normal foveal contour, no SRF, no IRF, myopic contour (Partial PVD).   Notes *Images captured and stored on drive  Diagnosis / Impression:  OD: Interval improvement in nasal retinoschisis, focal shallow SRF caught on widefield OS: NFP, no IRF/SRF, partial PVD   Clinical management:  See below  Abbreviations: NFP - Normal foveal profile. CME - cystoid macular edema. PED - pigment epithelial detachment. IRF - intraretinal fluid. SRF - subretinal fluid. EZ - ellipsoid zone. ERM - epiretinal membrane. ORA - outer retinal atrophy. ORT - outer retinal tubulation. SRHM - subretinal hyper-reflective material            ASSESSMENT/PLAN:    ICD-10-CM   1. Bilateral retinal lattice degeneration  H35.413     2. Retinal hole of both eyes  H33.323     3. 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 Retinal edema  H35.81 OCT, Retina - OU - Both Eyes    6. Combined forms of age-related cataract of both eyes  H25.813     7. Myopia of both eyes  H52.13      1-3. Lattice degeneration w/ atrophic holes, OU; OS with focal SRF/RD  - OD: atrophic hole at 1030, mild lattice with small hole at 1200  - OS: pigmented lattice at 0100 and 0430-0500--atrophic hole and +SRF at 0500  - s/p laser retinopexy OS (05.11.20), fill-in (06.05.20) -- good laser changes surrounding all lesions  - s/p laser retinopexy OD (05.18.20), fill-in (06.05.20) -- good laser changes surrounding all lesions  - f/u 6 months  4,5. Retinal edema, myopic retinoschisis OD  - nasal IRF/cystic macular changes extending from inf temp disc -- +intraretinal stranding -- improving  - FA (05.18.20) without hyperfluorescence/leakage suggesting not CME or optic pit -- myopic retinoschisis  - improved from prior on OCT -- will continue to monitor  - f/u 6 months  6. Mixed form age related cataract OU  - The symptoms of cataract, surgical options, and treatments and risks were discussed with patient.  - discussed diagnosis and progression  - approaching visual significance (OD > OS)  - under the expert management of Dr. Satira Sark  - clear from a retina standpoint to proceed with cataract surgery when pt and surgeon are ready   - monitor for now  7. Myopia OU  - discussed association of myopia with  lattice degeneration and RT/RD and retinoschisis  - monitor  Ophthalmic Meds Ordered this visit:  No orders of the defined types were placed in this encounter.     This document serves as a record of services personally performed by Gardiner Sleeper, MD, PhD. It was created on their behalf by Estill Bakes, COT an ophthalmic technician. The creation of this record is the provider's dictation and/or activities during the visit.    Electronically signed by: Estill Bakes, COT 7.29.22 @ 12:03 AM   Gardiner Sleeper, M.D., Ph.D. Diseases & Surgery of the Retina and [place] 06/22/2021   I have reviewed the above documentation for accuracy and completeness, and I agree with the above. Gardiner Sleeper, M.D., Ph.D. 06/23/21 12:03 AM  Abbreviations: M myopia (nearsighted); A astigmatism; H hyperopia (farsighted); P presbyopia; Mrx spectacle prescription;  CTL contact lenses; OD right eye; OS left eye; OU both eyes  XT exotropia; ET esotropia; PEK punctate epithelial keratitis; PEE punctate epithelial erosions; DES dry eye syndrome; MGD meibomian gland dysfunction; ATs artificial tears; PFAT's preservative free artificial tears; [place] nuclear sclerotic cataract; PSC posterior subcapsular cataract; ERM epi-retinal membrane; PVD posterior vitreous detachment; RD retinal detachment; DM diabetes mellitus; DR diabetic retinopathy; NPDR non-proliferative diabetic retinopathy; PDR proliferative diabetic retinopathy; CSME clinically significant macular edema; DME diabetic macular edema; dbh dot blot hemorrhages; CWS cotton wool spot; POAG primary open angle glaucoma; C/D cup-to-disc ratio; HVF humphrey visual field; GVF goldmann visual field; OCT optical coherence tomography; IOP intraocular pressure; BRVO Branch retinal vein occlusion; CRVO central retinal vein occlusion; CRAO central retinal artery occlusion; BRAO branch retinal artery occlusion; RT retinal tear; SB scleral buckle; PPV pars plana vitrectomy; VH Vitreous hemorrhage; PRP panretinal laser photocoagulation; IVK intravitreal kenalog; VMT vitreomacular traction; MH Macular hole;  NVD neovascularization of the disc; NVE neovascularization elsewhere; AREDS age related eye disease study; ARMD age related macular degeneration; POAG primary open angle glaucoma; EBMD epithelial/anterior basement membrane dystrophy; ACIOL anterior chamber intraocular lens; IOL intraocular lens; PCIOL posterior  chamber intraocular lens; Phaco/IOL phacoemulsification with intraocular lens placement; [place] photorefractive keratectomy; LASIK laser assisted in situ keratomileusis; HTN hypertension; DM diabetes mellitus; COPD chronic obstructive pulmonary disease

## 2021-06-22 ENCOUNTER — Encounter (INDEPENDENT_AMBULATORY_CARE_PROVIDER_SITE_OTHER): Payer: Self-pay | Admitting: Ophthalmology

## 2021-06-22 ENCOUNTER — Ambulatory Visit (INDEPENDENT_AMBULATORY_CARE_PROVIDER_SITE_OTHER): Payer: 59 | Admitting: Ophthalmology

## 2021-06-22 ENCOUNTER — Other Ambulatory Visit: Payer: Self-pay

## 2021-06-22 DIAGNOSIS — H3322 Serous retinal detachment, left eye: Secondary | ICD-10-CM

## 2021-06-22 DIAGNOSIS — H25813 Combined forms of age-related cataract, bilateral: Secondary | ICD-10-CM

## 2021-06-22 DIAGNOSIS — H35413 Lattice degeneration of retina, bilateral: Secondary | ICD-10-CM | POA: Diagnosis not present

## 2021-06-22 DIAGNOSIS — H1830 Unspecified corneal membrane change: Secondary | ICD-10-CM

## 2021-06-22 DIAGNOSIS — H209 Unspecified iridocyclitis: Secondary | ICD-10-CM

## 2021-06-22 DIAGNOSIS — H04123 Dry eye syndrome of bilateral lacrimal glands: Secondary | ICD-10-CM

## 2021-06-22 DIAGNOSIS — H3581 Retinal edema: Secondary | ICD-10-CM | POA: Diagnosis not present

## 2021-06-22 DIAGNOSIS — H40053 Ocular hypertension, bilateral: Secondary | ICD-10-CM

## 2021-06-22 DIAGNOSIS — H5213 Myopia, bilateral: Secondary | ICD-10-CM

## 2021-06-22 DIAGNOSIS — H33323 Round hole, bilateral: Secondary | ICD-10-CM

## 2021-06-22 DIAGNOSIS — H33101 Unspecified retinoschisis, right eye: Secondary | ICD-10-CM | POA: Diagnosis not present

## 2021-06-22 DIAGNOSIS — H35033 Hypertensive retinopathy, bilateral: Secondary | ICD-10-CM

## 2021-08-22 ENCOUNTER — Ambulatory Visit: Payer: 59 | Admitting: Hematology and Oncology

## 2021-08-22 NOTE — Assessment & Plan Note (Deleted)
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  Breast Cancer Surveillance: 1. Breast exam9/28/2022: Benign 2. Mammogram 3/23/2022benign. Postsurgical changes. Breast Density Category C. 3. Bone density 07/29/17: T score -1.7  Emotional issues: Patient is in the process of cutting down and discontinuing Celexa.She will try to half the dose. she got married in July 2019.  Her mom was diagnosed with polycythemia vera and is currently taking Hydrea.  She wants her to come and be seen by [place] because it is closer and more convenient to be here.  Return to clinicin oneyear.

## 2021-10-12 ENCOUNTER — Other Ambulatory Visit: Payer: Self-pay

## 2021-10-12 ENCOUNTER — Ambulatory Visit
Admission: RE | Admit: 2021-10-12 | Discharge: 2021-10-12 | Disposition: A | Discharge status: Home / Self Care | Payer: 59 | Source: Ambulatory Visit | Attending: Internal Medicine | Admitting: Internal Medicine

## 2021-10-12 DIAGNOSIS — M85851 Other specified disorders of bone density and structure, right thigh: Secondary | ICD-10-CM

## 2021-10-14 ENCOUNTER — Encounter: Payer: Self-pay | Admitting: Internal Medicine

## 2021-10-16 ENCOUNTER — Telehealth: Payer: Self-pay | Admitting: Hematology and Oncology

## 2021-10-16 NOTE — Telephone Encounter (Signed)
Scheduled per sch msg. Called and left msg. Mailed printout  

## 2021-11-22 NOTE — Progress Notes (Signed)
° °Patient Care Team: °Burns, Stacy J, MD as PCP - General (Internal Medicine) °Alva, Rakesh V, MD (Pulmonary Disease) °Adkins, Gretchen, MD (Obstetrics and Gynecology) °Mann, Jyothi, MD (Gastroenterology) °Cornett, Thomas, MD as Consulting Physician (General Surgery) °Gudena, Vinay, MD as Consulting Physician (Hematology and Oncology) °Squire, Sarah, MD as Attending Physician (Radiation Oncology) °Mackey, Heather Thompson, NP as Nurse Practitioner (Hematology and Oncology) ° °DIAGNOSIS:  °  ICD-10-CM   °1. Malignant neoplasm of upper-inner quadrant of right breast in female, estrogen receptor positive (HCC)  C50.211   ° Z17.0   °  ° ° °SUMMARY OF ONCOLOGIC HISTORY: °Oncology History  °Breast cancer of upper-inner quadrant of right female breast (HCC)  °10/10/2015 Mammogram  ° Screening mammogram revealed right breast distortion, extremely dense breasts, at 1:00 position 1 x 1 x 1.4 cm °  °10/11/2015 Initial Biopsy  ° Right breast biopsy 1:00: Invasive ductal carcinoma with DCIS and LCIS ER 95%, PR 80%, HER-2 negative ratio 1.15, Ki-67 3% °  °10/11/2015 Clinical Stage  ° Stage IA: T1c N0 °  °11/16/2015 Surgery  ° Right lumpectomy: 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 with above-mentioned history of right breast cancer who underwent lumpectomy, radiation, and is currently on oral antiestrogen therapy with anastrozole, after she could not tolerate tamoxifen.Mammogram on 02/14/2021 showed no evidence of malignancy bilaterally. She  presents to the clinic today for annual follow-up.  Tolerating anastrozole fairly well.  Bone density showed osteopenia but she does not want to take any bisphosphonate therapy at this time.  She had multiple issues with her eyes.  She has glaucoma as well as cataract in the right eye. ° °ALLERGIES:  has No Known Allergies. ° °MEDICATIONS:  °Current Outpatient Medications  °Medication Sig Dispense Refill  ° anastrozole (ARIMIDEX) 1 MG tablet TAKE 1 TABLET BY MOUTH  DAILY 90 tablet 3  ° benzonatate (TESSALON PERLES) 100 MG capsule Take 1 capsule (100 mg total) by mouth 3 (three) times daily as needed. 20 capsule 0  ° citalopram (CELEXA) 20 MG tablet TAKE 1 TABLET BY MOUTH DAILY 90 tablet 3  ° cycloSPORINE (RESTASIS) 0.05 % ophthalmic emulsion Place into both eyes 2 (two) times daily.     ° eszopiclone (LUNESTA) 2 MG TABS tablet TAKE 1 TABLET BY MOUTH AT  BEDTIME AS NEEDED FOR SLEEP IMMEDIATELY BEFORE BEDTIME. (Patient not taking: Reported on 06/22/2021) 90 tablet 0  ° latanoprost (XALATAN) 0.005 % ophthalmic solution 1 drop at bedtime.    ° levothyroxine (SYNTHROID) 75 MCG tablet Take 1 tablet (75 mcg total) by mouth daily before breakfast. 90 tablet 3  ° Multiple Vitamin (MULTIVITAMIN) capsule Take 1 capsule by mouth daily.    ° tretinoin (RETIN-A) 0.025 % cream Apply topically at bedtime. 45 g 0  ° °No current facility-administered medications for this visit.  ° ° °PHYSICAL EXAMINATION: °ECOG PERFORMANCE STATUS: 1 - Symptomatic but completely ambulatory ° °There were no vitals filed for this visit. °There were no   vitals filed for this visit. ° °BREAST: No palpable masses or nodules in either right or left breasts. No palpable axillary supraclavicular or infraclavicular adenopathy no breast tenderness or nipple discharge. (exam performed in the presence of a chaperone) ° °LABORATORY DATA:  °I have reviewed the data as listed °CMP Latest Ref Rng & Units 04/19/2021 12/10/2019 12/02/2018  °Glucose 70 - 99 mg/dL 87 94 98   °BUN 6 - 23 mg/dL 11 17 17  °Creatinine 0.40 - 1.20 mg/dL 0.73 0.77 0.72  °Sodium 135 - 145 mEq/L 138 136 137  °Potassium 3.5 - 5.1 mEq/L 4.4 3.8 4.1  °Chloride 96 - 112 mEq/L 103 99 102  °CO2 19 - 32 mEq/L 29 30 28  °Calcium 8.4 - 10.5 mg/dL 9.8 9.8 9.9  °Total Protein 6.0 - 8.3 g/dL 7.0 7.2 7.0  °Total Bilirubin 0.2 - 1.2 mg/dL 0.4 0.4 0.5  °Alkaline Phos 39 - 117 U/L 37(L) 40 39  °AST 0 - 37 U/L 17 18 18  °ALT 0 - 35 U/L 16 15 16  ° ° °Lab Results  °Component Value Date  ° WBC 3.3 (L) 04/19/2021  ° HGB 12.9 04/19/2021  ° HCT 37.7 04/19/2021  ° MCV 91.8 04/19/2021  ° PLT 269.0 04/19/2021  ° NEUTROABS 1.6 04/19/2021  ° ° °ASSESSMENT & PLAN:  °Breast cancer of upper-inner quadrant of right female breast (HCC) °Right lumpectomy 11/16/2015: 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 °She will complete anastrozole April 2023 °  °Breast Cancer Surveillance: °1. Breast exam 11/23/21: Benign °2. Mammogram 02/14/21 benign. Postsurgical changes. Breast Density Category C.  °3. Bone density 10/12/21: T score -2.3 °  °Emotional issues: Continues on Celexa °she got married in July 2019.  She has a grandchild now. °  °Mom has polycythemia vera °Glaucoma and cataract in the right eye: Patient might be undergoing surgery for that. ° °Return to clinic on an as-needed basis. ° ° °No orders of the defined types were placed in this encounter. ° °The patient has a good understanding of the overall plan. she agrees with it. she will call with any problems that may develop before the next visit here. ° °Total time spent: 30 mins including face to  face time and time spent for planning, charting and coordination of care ° °Vinay K Gudena, MD, MPH °11/23/2021 ° °I, Kirstyn Evans, am acting as scribe for Dr. Vinay Gudena. ° °I have reviewed the above documentation for accuracy and completeness, and I agree with the above. ° ° ° ° °  °

## 2021-11-23 ENCOUNTER — Other Ambulatory Visit: Payer: Self-pay

## 2021-11-23 ENCOUNTER — Inpatient Hospital Stay: Payer: 59 | Attending: Hematology and Oncology | Admitting: Hematology and Oncology

## 2021-11-23 DIAGNOSIS — H269 Unspecified cataract: Secondary | ICD-10-CM | POA: Diagnosis not present

## 2021-11-23 DIAGNOSIS — Z170 Estrogen receptor positive status [ER+]: Secondary | ICD-10-CM | POA: Insufficient documentation

## 2021-11-23 DIAGNOSIS — M8580 Other specified disorders of bone density and structure, unspecified site: Secondary | ICD-10-CM | POA: Diagnosis not present

## 2021-11-23 DIAGNOSIS — H409 Unspecified glaucoma: Secondary | ICD-10-CM | POA: Diagnosis not present

## 2021-11-23 DIAGNOSIS — Z79899 Other long term (current) drug therapy: Secondary | ICD-10-CM | POA: Diagnosis not present

## 2021-11-23 DIAGNOSIS — Z79811 Long term (current) use of aromatase inhibitors: Secondary | ICD-10-CM | POA: Insufficient documentation

## 2021-11-23 DIAGNOSIS — C50211 Malignant neoplasm of upper-inner quadrant of right female breast: Secondary | ICD-10-CM | POA: Diagnosis present

## 2021-11-23 NOTE — Assessment & Plan Note (Signed)
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  Breast Cancer Surveillance: 1. Breast exam12/30/22: Benign 2. Mammogram3/23/22benign. Postsurgical changes. Breast Density Category C. 3. Bone density 10/12/21: T score -2.3  Emotional issues: Patient is in the process of cutting down and discontinuing Celexa.She will try to half the dose. she got married in July 2019.  Her mom was diagnosed with polycythemia vera and is currently taking Hydrea.  She wants her to come and be seen by [place] because it is closer and more convenient to be here.  Return to clinicin oneyear.

## 2021-12-28 ENCOUNTER — Encounter (INDEPENDENT_AMBULATORY_CARE_PROVIDER_SITE_OTHER): Payer: 59 | Admitting: Ophthalmology

## 2022-02-12 ENCOUNTER — Other Ambulatory Visit: Payer: Self-pay | Admitting: Internal Medicine

## 2022-02-19 ENCOUNTER — Other Ambulatory Visit: Payer: Self-pay | Admitting: Hematology and Oncology

## 2022-02-19 ENCOUNTER — Other Ambulatory Visit: Payer: Self-pay | Admitting: Internal Medicine

## 2022-02-19 DIAGNOSIS — Z1231 Encounter for screening mammogram for malignant neoplasm of breast: Secondary | ICD-10-CM

## 2022-02-20 ENCOUNTER — Telehealth: Payer: Self-pay | Admitting: Internal Medicine

## 2022-02-20 DIAGNOSIS — E559 Vitamin D deficiency, unspecified: Secondary | ICD-10-CM

## 2022-02-20 DIAGNOSIS — R7303 Prediabetes: Secondary | ICD-10-CM

## 2022-02-20 DIAGNOSIS — E039 Hypothyroidism, unspecified: Secondary | ICD-10-CM

## 2022-02-20 DIAGNOSIS — Z0000 Encounter for general adult medical examination without abnormal findings: Secondary | ICD-10-CM

## 2022-02-20 DIAGNOSIS — E782 Mixed hyperlipidemia: Secondary | ICD-10-CM

## 2022-02-20 NOTE — Telephone Encounter (Signed)
Patient is scheduled for her physical in June and would like to get labs the week prior. Please place orders for labs. ?

## 2022-02-21 NOTE — Telephone Encounter (Signed)
Blood work ordered.

## 2022-02-22 NOTE — Telephone Encounter (Signed)
My-chart message sent to patient to provide availability regarding appointment. ?

## 2022-02-25 NOTE — Telephone Encounter (Signed)
Appointment made

## 2022-03-05 ENCOUNTER — Ambulatory Visit
Admission: RE | Admit: 2022-03-05 | Discharge: 2022-03-05 | Disposition: A | Discharge status: Home / Self Care | Payer: 59 | Source: Ambulatory Visit | Attending: Internal Medicine | Admitting: Internal Medicine

## 2022-03-05 DIAGNOSIS — Z1231 Encounter for screening mammogram for malignant neoplasm of breast: Secondary | ICD-10-CM

## 2022-03-26 NOTE — Progress Notes (Signed)
?[facility] Note ? ?04/05/2022 ? ?  ? ?CHIEF COMPLAINT ?Patient presents for Retina Follow Up ? ? ?HISTORY OF PRESENT ILLNESS: ?Jeanne Parker is a 61 y.o. female who presents to the clinic today for:  ? ?HPI   ? ? Retina Follow Up   ?Patient presents with  Other.  In both eyes.  This started 6 months ago.  I, the attending physician,  performed the HPI with the patient and updated documentation appropriately. ? ?  ?  ? ? 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 Patient states vision seems good. No eye pain. Has glaucoma. In on Timolol BID OU.  ? ?  ?  ?Last edited by Bernarda Caffey, MD on 04/06/2022  1:31 AM.  ?  ?Pt states she had cataract sx OD with Dr. Satira Sark, she states it went well and she is happy with the results, she is on Timolol BID OU, she denies fol or new or worsening floaters ? ? ?Referring physician: ?Binnie Rail, MD ?[place],  [place] [address] ? ?HISTORICAL INFORMATION:  ? ?Selected notes from the [facility] ?Referred by Dr. Wyatt Portela for concern of iritis, operculated hole ?LEE: 05.11.20 ([facility])   ?Ocular Hx- ?PMH-anxiety, breast cancer, hypothyroid  ? ?CURRENT MEDICATIONS: ?Current Outpatient Medications (Ophthalmic Drugs)  ?Medication Sig  ? cycloSPORINE (RESTASIS) 0.05 % ophthalmic emulsion Place into both eyes 2 (two) times daily.   ? latanoprost (XALATAN) 0.005 % ophthalmic solution 1 drop at bedtime. (Patient not taking: Reported on 04/05/2022)  ? ?No current facility-administered medications for this visit. (Ophthalmic Drugs)  ? ?Current Outpatient Medications (Other)  ?Medication Sig  ? citalopram (CELEXA) 20 MG tablet TAKE 1 TABLET BY MOUTH  DAILY  ? eszopiclone (LUNESTA) 2 MG TABS tablet TAKE 1 TABLET BY MOUTH AT  BEDTIME AS NEEDED FOR SLEEP IMMEDIATELY BEFORE BEDTIME.  ? levothyroxine (SYNTHROID) 75 MCG tablet TAKE 1 TABLET BY MOUTH  DAILY BEFORE BREAKFAST  ? Multiple Vitamin  (MULTIVITAMIN) capsule Take 1 capsule by mouth daily.  ? tretinoin (RETIN-A) 0.025 % cream Apply topically at bedtime.  ? anastrozole (ARIMIDEX) 1 MG tablet TAKE 1 TABLET BY MOUTH  DAILY (Patient not taking: Reported on 04/05/2022)  ? benzonatate (TESSALON PERLES) 100 MG capsule Take 1 capsule (100 mg total) by mouth 3 (three) times daily as needed. (Patient not taking: Reported on 04/05/2022)  ? ?No current facility-administered medications for this visit. (Other)  ? ?REVIEW OF SYSTEMS: ?ROS   ?Positive for: Endocrine, Eyes ?Negative for: Constitutional, Gastrointestinal, Neurological, Skin, Genitourinary, Musculoskeletal, HENT, Cardiovascular, Respiratory, Psychiatric, Allergic/Imm, Heme/Lymph ?Last edited by Theodore Demark, COA on 04/05/2022  2:43 PM.  ?  ? ?ALLERGIES ?No Known Allergies ? ?PAST MEDICAL HISTORY ?Past Medical History:  ?Diagnosis Date  ? Anxiety   ? situational  ? Breast cancer ([place])   ? 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 Cataract   ? OU  ? History of chicken pox   ? History of colon polyps   ? Hypothyroid   ? Personal history of radiation therapy   ? ?Past Surgical History:  ?Procedure Laterality Date  ? BREAST BIOPSY    ? BREAST EXCISIONAL BIOPSY Left 1998  ? BREAST LUMPECTOMY Right 2016  ? BREAST SURGERY  1998  ? fibroid adenoma   ? RADIOACTIVE SEED GUIDED PARTIAL MASTECTOMY WITH AXILLARY SENTINEL LYMPH NODE BIOPSY Right 11/16/2015  ?  Procedure: RADIOACTIVE SEED GUIDED PARTIAL MASTECTOMY WITH AXILLARY SENTINEL LYMPH NODE BIOPSY;  Surgeon: Erroll Luna, MD;  Location: [facility];  Service: General;  Laterality: Right;  ? ?FAMILY HISTORY ?Family History  ?Problem Relation Age of Onset  ? Hypertension Mother 72  ? Hyperlipidemia Mother   ? Thyroid nodules Mother   ? Diabetes Father 39  ? Hyperlipidemia Father   ? Hypertension Father   ? GER disease Father   ? Coronary artery disease Father   ?     angioplasty age 53s  ? Atrial fibrillation Father   ?     on  coumadin  ? Thyroid cancer Sister   ? Colon cancer Paternal Grandfather   ?     died age 19  ? Breast cancer Cousin   ? Gout Brother   ? ?SOCIAL HISTORY ?Social History  ? ?Tobacco Use  ? Smoking status: Never  ? Smokeless tobacco: Never  ?Vaping Use  ? Vaping Use: Never used  ?Substance Use Topics  ? Alcohol use: Yes  ?  Comment: 1 to 2 glasses of wine a week   ? Drug use: No  ?  ? ?  ?Base Eye Exam   ? ? Visual Acuity (Snellen - Linear)   ? ?   Right Left  ? Dist [place] 20/20   ? Dist cc  20/20 -1  ?Patient is mono vision. OD distance no contact. OS near with contact lens. Used J Card for OS. ? ?  ?  ? ? Tonometry (Tonopen, 2:39 PM)   ? ?   Right Left  ? Pressure 16 15  ? ?  ?  ? ? Pupils   ? ?   Dark Light Shape React APD  ? Right 5 4 Round Brisk None  ? Left 5 4 Round Brisk None  ? ?  ?  ? ? Visual Fields (Counting fingers)   ? ?   Left Right  ?  Full Full  ? ?  ?  ? ? Extraocular Movement   ? ?   Right Left  ?  Full, Ortho Full, Ortho  ? ?  ?  ? ? Neuro/Psych   ? ? Oriented x3: Yes  ? Mood/Affect: Normal  ? ?  ?  ? ? Dilation   ? ? Both eyes: 1.0% Mydriacyl, 2.5% Phenylephrine @ 2:39 PM  ? ?  ?  ? ?  ? ?Slit Lamp and Fundus Exam   ? ? Slit Lamp Exam   ? ?   Right Left  ? Lids/Lashes mild Meibomian gland dysfunction, dermato mild Meibomian gland dysfunction, dermato  ? Conjunctiva/Sclera White and quiet White and quiet  ? Cornea mild Arcus, 1+ inferior Punctate epithelial erosions, Debris in tear film, well healed cataract wound mild Arcus, trace inferior Punctate epithelial erosions  ? Anterior Chamber Deep and quiet deep and clear  ? Iris Round and well dilated Round and well dilated  ? Lens PC IOL in good position 2-3+ Nuclear sclerosis, 2+ Cortical cataract  ? Anterior Vitreous Vitreous syneresis, +pigment in anterior vitreous, Posterior vitreous detachment, vitreous condensations Vitreous syneresis  ? ?  ?  ? ? Fundus Exam   ? ?   Right Left  ? Disc Pink and Sharp, mild tilt, Peripapillary atrophy, Compact  Tilted disc, temporal Peripapillary atrophy, Pink and Sharp, Compact  ? C/D Ratio 0.4 0.6  ? 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 Vessels attenuated mild attenuation  ? Periphery Attached, superior lattice w/ atrophic holes at 1030 and 1200 w/+SRF -- good laser surrounding; 3 focal patches of pigmented lattice with RPE atrophy from 0600 to 0700 Attached, pigmented lattice at 0100 and from 0430-0600; atrophic hole w/ mild cuff of SRF at 0500 -- good laser surrounding all lesions, No new RT/RD  ? ?  ?  ? ?  ? ?Refraction   ? ? Wearing Rx   ? ?   Sphere Cylinder Axis  ? Right -5.25 +0.50 130  ? Left -5.50 +0.50 117  ? ?  ?  ? ?  ? ?IMAGING AND PROCEDURES  ?Imaging and Procedures for '@TODAY'$ @ ? ?OCT, Retina - OU - Both Eyes   ? ?   ?Right Eye ?Quality was good. Central Foveal Thickness: 253. Progression has been stable. Findings include no SRF, myopic contour, normal foveal contour, no IRF (nasal retinoschisis stably improved, focal shallow SRF caught on widefield -- not imaged today).  ? ?Left Eye ?Quality was good. Central Foveal Thickness: 259. Progression has been stable. Findings include normal foveal contour, no SRF, no IRF, myopic contour (Partial PVD).  ? ?Notes ?*Images captured and stored on drive ? ?Diagnosis / Impression:  ?OD: nasal retinoschisis stably improved, focal shallow SRF caught on widefield -- not imaged ?OS: NFP, no IRF/SRF, partial PVD ? ?Clinical management:  ?See below ? ?Abbreviations: NFP - Normal foveal profile. CME - cystoid macular edema. PED - pigment epithelial detachment. IRF - intraretinal fluid. SRF - subretinal fluid. EZ - ellipsoid zone. ERM - epiretinal membrane. ORA - outer retinal atrophy. ORT - outer retinal tubulation. SRHM - subretinal hyper-reflective material ? ? ?   ?  ?  ? ?  ?ASSESSMENT/PLAN: ? ?  ICD-10-CM   ?1. Bilateral retinal lattice degeneration  H35.413 OCT, Retina - OU - Both Eyes  ?  ?2. Retinal hole of both eyes  H33.323   ?  ?3. Left retinal detachment  H33.22   ?  ?4.

## 2022-04-05 ENCOUNTER — Ambulatory Visit (INDEPENDENT_AMBULATORY_CARE_PROVIDER_SITE_OTHER): Payer: 59 | Admitting: Ophthalmology

## 2022-04-05 ENCOUNTER — Encounter (INDEPENDENT_AMBULATORY_CARE_PROVIDER_SITE_OTHER): Payer: Self-pay | Admitting: Ophthalmology

## 2022-04-05 DIAGNOSIS — H35413 Lattice degeneration of retina, bilateral: Secondary | ICD-10-CM | POA: Diagnosis not present

## 2022-04-05 DIAGNOSIS — H3322 Serous retinal detachment, left eye: Secondary | ICD-10-CM | POA: Diagnosis not present

## 2022-04-05 DIAGNOSIS — H33101 Unspecified retinoschisis, right eye: Secondary | ICD-10-CM

## 2022-04-05 DIAGNOSIS — H33323 Round hole, bilateral: Secondary | ICD-10-CM

## 2022-04-05 DIAGNOSIS — H5213 Myopia, bilateral: Secondary | ICD-10-CM

## 2022-04-05 DIAGNOSIS — Z961 Presence of intraocular lens: Secondary | ICD-10-CM

## 2022-04-05 DIAGNOSIS — H25813 Combined forms of age-related cataract, bilateral: Secondary | ICD-10-CM

## 2022-04-06 ENCOUNTER — Encounter (INDEPENDENT_AMBULATORY_CARE_PROVIDER_SITE_OTHER): Payer: Self-pay | Admitting: Ophthalmology

## 2022-04-24 ENCOUNTER — Other Ambulatory Visit (INDEPENDENT_AMBULATORY_CARE_PROVIDER_SITE_OTHER): Payer: 59

## 2022-04-24 DIAGNOSIS — E039 Hypothyroidism, unspecified: Secondary | ICD-10-CM

## 2022-04-24 DIAGNOSIS — E782 Mixed hyperlipidemia: Secondary | ICD-10-CM

## 2022-04-24 DIAGNOSIS — R7303 Prediabetes: Secondary | ICD-10-CM | POA: Diagnosis not present

## 2022-04-24 DIAGNOSIS — E559 Vitamin D deficiency, unspecified: Secondary | ICD-10-CM | POA: Diagnosis not present

## 2022-04-24 DIAGNOSIS — Z0000 Encounter for general adult medical examination without abnormal findings: Secondary | ICD-10-CM

## 2022-04-24 LAB — CBC WITH DIFFERENTIAL/PLATELET
Basophils Absolute: 0 10*3/uL (ref 0.0–0.1)
Basophils Relative: 0.4 % (ref 0.0–3.0)
Eosinophils Absolute: 0.1 10*3/uL (ref 0.0–0.7)
Eosinophils Relative: 4.6 % (ref 0.0–5.0)
HCT: 38.8 % (ref 36.0–46.0)
Hemoglobin: 13 g/dL (ref 12.0–15.0)
Lymphocytes Relative: 36.3 % (ref 12.0–46.0)
Lymphs Abs: 1 10*3/uL (ref 0.7–4.0)
MCHC: 33.6 g/dL (ref 30.0–36.0)
MCV: 92.6 fl (ref 78.0–100.0)
Monocytes Absolute: 0.3 10*3/uL (ref 0.1–1.0)
Monocytes Relative: 11.1 % (ref 3.0–12.0)
Neutro Abs: 1.3 10*3/uL — ABNORMAL LOW (ref 1.4–7.7)
Neutrophils Relative %: 47.6 % (ref 43.0–77.0)
Platelets: 293 10*3/uL (ref 150.0–400.0)
RBC: 4.19 Mil/uL (ref 3.87–5.11)
RDW: 13.7 % (ref 11.5–15.5)
WBC: 2.8 10*3/uL — ABNORMAL LOW (ref 4.0–10.5)

## 2022-04-24 LAB — COMPREHENSIVE METABOLIC PANEL
ALT: 20 U/L (ref 0–35)
AST: 21 U/L (ref 0–37)
Albumin: 4.6 g/dL (ref 3.5–5.2)
Alkaline Phosphatase: 31 U/L — ABNORMAL LOW (ref 39–117)
BUN: 15 mg/dL (ref 6–23)
CO2: 30 mEq/L (ref 19–32)
Calcium: 10 mg/dL (ref 8.4–10.5)
Chloride: 101 mEq/L (ref 96–112)
Creatinine, Ser: 0.81 mg/dL (ref 0.40–1.20)
GFR: 78.3 mL/min (ref >60.00)
Glucose, Bld: 101 mg/dL — ABNORMAL HIGH (ref 70–99)
Potassium: 4.3 mEq/L (ref 3.5–5.1)
Sodium: 138 mEq/L (ref 135–145)
Total Bilirubin: 0.5 mg/dL (ref 0.2–1.2)
Total Protein: 7.1 g/dL (ref 6.0–8.3)

## 2022-04-24 LAB — LIPID PANEL
Cholesterol: 303 mg/dL — ABNORMAL HIGH (ref 0–200)
HDL: 75.8 mg/dL (ref >39.00)
LDL Cholesterol: 209 mg/dL — ABNORMAL HIGH (ref 0–99)
NonHDL: 226.9
Total CHOL/HDL Ratio: 4
Triglycerides: 89 mg/dL (ref 0.0–149.0)
VLDL: 17.8 mg/dL (ref 0.0–40.0)

## 2022-04-24 LAB — TSH: TSH: 1.5 u[IU]/mL (ref 0.35–5.50)

## 2022-04-24 LAB — VITAMIN D 25 HYDROXY (VIT D DEFICIENCY, FRACTURES): VITD: 47.99 ng/mL (ref 30.00–100.00)

## 2022-04-24 LAB — HEMOGLOBIN A1C: Hgb A1c MFr Bld: 5.8 % (ref 4.6–6.5)

## 2022-04-26 ENCOUNTER — Encounter: Payer: Self-pay | Admitting: Internal Medicine

## 2022-04-26 NOTE — Progress Notes (Signed)
Subjective:    Patient ID: Jeanne Parker, female    DOB: 1961/10/18, 61 y.o.   MRN: [id number]      HPI Jeanne Parker is here for  Chief Complaint  Patient presents with   Annual Exam    She had blood work done - cholesterol very high. Had calcium score of 0 in 2020   She developed tinnitus in her left ear.  It started in the fall 2022.  No change in hearing.     Medications and allergies reviewed with patient and updated if appropriate.  Current Outpatient Medications on File Prior to Visit  Medication Sig Dispense Refill   citalopram (CELEXA) 20 MG tablet TAKE 1 TABLET BY MOUTH  DAILY 90 tablet 3   cycloSPORINE (RESTASIS) 0.05 % ophthalmic emulsion Place into both eyes 2 (two) times daily.      eszopiclone (LUNESTA) 2 MG TABS tablet TAKE 1 TABLET BY MOUTH AT  BEDTIME AS NEEDED FOR SLEEP IMMEDIATELY BEFORE BEDTIME. 90 tablet 0   levothyroxine (SYNTHROID) 75 MCG tablet TAKE 1 TABLET BY MOUTH  DAILY BEFORE BREAKFAST 90 tablet 3   Multiple Vitamin (MULTIVITAMIN) capsule Take 1 capsule by mouth daily.     tretinoin (RETIN-A) 0.025 % cream Apply topically at bedtime. 45 g 0   timolol (TIMOPTIC) 0.5 % ophthalmic solution 1 drop 2 (two) times daily.     No current facility-administered medications on file prior to visit.    Review of Systems  Constitutional:  Negative for fever.  HENT:  Positive for tinnitus (left). Negative for hearing loss.   Eyes:  Negative for visual disturbance.  Respiratory:  Negative for cough, shortness of breath and wheezing.   Cardiovascular:  Negative for chest pain, palpitations and leg swelling.  Gastrointestinal:  Negative for abdominal pain, blood in stool, constipation, diarrhea and nausea.       No gerd  Genitourinary:  Negative for dysuria.  Musculoskeletal:  Negative for arthralgias and back pain.  Skin:  Negative for rash.  Neurological:  Negative for light-headedness and headaches.  Psychiatric/Behavioral:  Negative for dysphoric mood.  The patient is not nervous/anxious.       Objective:   Vitals:   04/29/22 0757  BP: 110/78  Pulse: 67  Temp: 98.1 F (36.7 C)  SpO2: 97%   Filed Weights   04/29/22 0757  Weight: 133 lb (60.3 kg)   Body mass index is 22.13 kg/m.  BP Readings from Last 3 Encounters:  04/29/22 110/78  11/23/21 122/78  04/18/21 116/78    Wt Readings from Last 3 Encounters:  04/29/22 133 lb (60.3 kg)  11/23/21 134 lb 3 oz (60.9 kg)  06/07/21 129 lb (58.5 kg)       Physical Exam Constitutional: She appears well-developed and well-nourished. No distress.  HENT:  Head: Normocephalic and atraumatic.  Right Ear: External ear normal. Normal ear canal and TM Left Ear: External ear normal.  Normal ear canal and TM Mouth/Throat: Oropharynx is clear and moist.  Eyes: Conjunctivae normal.  Neck: Neck supple. No tracheal deviation present. No thyromegaly present.  No carotid bruit  Cardiovascular: Normal rate, regular rhythm and normal heart sounds.   No murmur heard.  No edema. Pulmonary/Chest: Effort normal and breath sounds normal. No respiratory distress. She has no wheezes. She has no rales.  Breast: deferred   Abdominal: Soft. She exhibits no distension. There is no tenderness.  Lymphadenopathy: She has no cervical adenopathy.  Skin: Skin is warm and dry. She is not  diaphoretic.  Psychiatric: She has a normal mood and affect. Her behavior is normal.     Lab Results  Component Value Date   WBC 2.8 (L) 04/24/2022   HGB 13.0 04/24/2022   HCT 38.8 04/24/2022   PLT 293.0 04/24/2022   GLUCOSE 101 (H) 04/24/2022   CHOL 303 (H) 04/24/2022   TRIG 89.0 04/24/2022   HDL 75.80 04/24/2022   LDLDIRECT 158.7 01/04/2013   LDLCALC 209 (H) 04/24/2022   ALT 20 04/24/2022   AST 21 04/24/2022   NA 138 04/24/2022   K 4.3 04/24/2022   CL 101 04/24/2022   CREATININE 0.81 04/24/2022   BUN 15 04/24/2022   CO2 30 04/24/2022   TSH 1.50 04/24/2022   HGBA1C 5.8 04/24/2022    The 10-year ASCVD  risk score (Arnett DK, et al., 2019) is: 6.2%   Values used to calculate the score:     Age: 80 years     Sex: Female     Is Non-Hispanic African American: No     Diabetic: No     Tobacco smoker: Yes     Systolic Blood Pressure: 195 mmHg     Is BP treated: No     HDL Cholesterol: 75.8 mg/dL     Total Cholesterol: 303 mg/dL       Assessment & Plan:   Physical exam: Screening blood work  done prior to visit - reviewed Exercise  running, pilates, hiking, yoga Weight  is good Substance abuse  none   Reviewed recommended immunizations.   Health Maintenance  Topic Date Due   PAP SMEAR-Modifier  03/29/2022   COVID-19 Vaccine (5 - Booster for Pfizer series) 05/15/2022 (Originally 07/10/2021)   INFLUENZA VACCINE  06/25/2022   DEXA SCAN  10/13/2023   MAMMOGRAM  03/05/2024   TETANUS/TDAP  06/29/2024   COLONOSCOPY (Pts 45-59yr Insurance coverage will need to be confirmed)  09/18/2025   Hepatitis C Screening  Completed   HIV Screening  Completed   Zoster Vaccines- Shingrix  Completed   HPV VACCINES  Aged Out          See Problem List for Assessment and Plan of chronic medical problems.   FU in one year

## 2022-04-26 NOTE — Patient Instructions (Addendum)
Medications changes include :   consider trying red yeast rice      Return in about 1 year (around 04/30/2023) for Physical Exam.   Health Maintenance, Female Adopting a healthy lifestyle and getting preventive care are important in promoting health and wellness. Ask your health care provider about: The right schedule for you to have regular tests and exams. Things you can do on your own to prevent diseases and keep yourself healthy. What should I know about diet, weight, and exercise? Eat a healthy diet  Eat a diet that includes plenty of vegetables, fruits, low-fat dairy products, and lean protein. Do not eat a lot of foods that are high in solid fats, added sugars, or sodium. Maintain a healthy weight Body mass index (BMI) is used to identify weight problems. It estimates body fat based on height and weight. Your health care provider can help determine your BMI and help you achieve or maintain a healthy weight. Get regular exercise Get regular exercise. This is one of the most important things you can do for your health. Most adults should: Exercise for at least 150 minutes each week. The exercise should increase your heart rate and make you sweat (moderate-intensity exercise). Do strengthening exercises at least twice a week. This is in addition to the moderate-intensity exercise. Spend less time sitting. Even light physical activity can be beneficial. Watch cholesterol and blood lipids Have your blood tested for lipids and cholesterol at 62 years of age, then have this test every 5 years. Have your cholesterol levels checked more often if: Your lipid or cholesterol levels are high. You are older than 62 years of age. You are at high risk for heart disease. What should I know about cancer screening? Depending on your health history and family history, you may need to have cancer screening at various ages. This may include screening for: Breast cancer. Cervical  cancer. Colorectal cancer. Skin cancer. Lung cancer. What should I know about heart disease, diabetes, and high blood pressure? Blood pressure and heart disease High blood pressure causes heart disease and increases the risk of stroke. This is more likely to develop in people who have high blood pressure readings or are overweight. Have your blood pressure checked: Every 3-5 years if you are 5-61 years of age. Every year if you are 43 years old or older. Diabetes Have regular diabetes screenings. This checks your fasting blood sugar level. Have the screening done: Once every three years after age 27 if you are at a normal weight and have a low risk for diabetes. More often and at a younger age if you are overweight or have a high risk for diabetes. What should I know about preventing infection? Hepatitis B If you have a higher risk for hepatitis B, you should be screened for this virus. Talk with your health care provider to find out if you are at risk for hepatitis B infection. Hepatitis C Testing is recommended for: Everyone born from [birth year] through 1966. Anyone with known risk factors for hepatitis C. Sexually transmitted infections (STIs) Get screened for STIs, including gonorrhea and chlamydia, if: You are sexually active and are younger than 62 years of age. You are older than 62 years of age and your health care provider tells you that you are at risk for this type of infection. Your sexual activity has changed since you were last screened, and you are at increased risk for chlamydia or gonorrhea. Ask your health care provider  if you are at risk. Ask your health care provider about whether you are at high risk for HIV. Your health care provider may recommend a prescription medicine to help prevent HIV infection. If you choose to take medicine to prevent HIV, you should first get tested for HIV. You should then be tested every 3 months for as long as you are taking the  medicine. Pregnancy If you are about to stop having your period (premenopausal) and you may become pregnant, seek counseling before you get pregnant. Take 400 to 800 micrograms (mcg) of folic acid every day if you become pregnant. Ask for birth control (contraception) if you want to prevent pregnancy. Osteoporosis and menopause Osteoporosis is a disease in which the bones lose minerals and strength with aging. This can result in bone fractures. If you are 84 years old or older, or if you are at risk for osteoporosis and fractures, ask your health care provider if you should: Be screened for bone loss. Take a calcium or vitamin D supplement to lower your risk of fractures. Be given hormone replacement therapy (HRT) to treat symptoms of menopause. Follow these instructions at home: Alcohol use Do not drink alcohol if: Your health care provider tells you not to drink. You are pregnant, may be pregnant, or are planning to become pregnant. If you drink alcohol: Limit how much you have to: 0-1 drink a day. Know how much alcohol is in your drink. In the [place] one drink equals one 12 oz bottle of beer (355 mL), one 5 oz glass of wine (148 mL), or one 1 oz glass of hard liquor (44 mL). Lifestyle Do not use any products that contain nicotine or tobacco. These products include cigarettes, chewing tobacco, and vaping devices, such as e-cigarettes. If you need help quitting, ask your health care provider. Do not use street drugs. Do not share needles. Ask your health care provider for help if you need support or information about quitting drugs. General instructions Schedule regular health, dental, and eye exams. Stay current with your vaccines. Tell your health care provider if: You often feel depressed. You have ever been abused or do not feel safe at home. Summary Adopting a healthy lifestyle and getting preventive care are important in promoting health and wellness. Follow your health care  provider's instructions about healthy diet, exercising, and getting tested or screened for diseases. Follow your health care provider's instructions on monitoring your cholesterol and blood pressure. This information is not intended to replace advice given to you by your health care provider. Make sure you discuss any questions you have with your health care provider. Document Revised: 04/02/2021 Document Reviewed: 04/02/2021 Elsevier Patient Education  [employer].

## 2022-04-29 ENCOUNTER — Ambulatory Visit (INDEPENDENT_AMBULATORY_CARE_PROVIDER_SITE_OTHER): Payer: 59 | Admitting: Internal Medicine

## 2022-04-29 VITALS — BP 110/78 | HR 67 | Temp 98.1°F | Ht 65.0 in | Wt 133.0 lb

## 2022-04-29 DIAGNOSIS — E782 Mixed hyperlipidemia: Secondary | ICD-10-CM

## 2022-04-29 DIAGNOSIS — Z0000 Encounter for general adult medical examination without abnormal findings: Secondary | ICD-10-CM | POA: Diagnosis not present

## 2022-04-29 DIAGNOSIS — Z860100 Personal history of colon polyps, unspecified: Secondary | ICD-10-CM | POA: Insufficient documentation

## 2022-04-29 DIAGNOSIS — Z800 Family history of malignant neoplasm of digestive organs: Secondary | ICD-10-CM | POA: Insufficient documentation

## 2022-04-29 DIAGNOSIS — R7303 Prediabetes: Secondary | ICD-10-CM

## 2022-04-29 DIAGNOSIS — M85851 Other specified disorders of bone density and structure, right thigh: Secondary | ICD-10-CM

## 2022-04-29 DIAGNOSIS — E039 Hypothyroidism, unspecified: Secondary | ICD-10-CM

## 2022-04-29 DIAGNOSIS — G479 Sleep disorder, unspecified: Secondary | ICD-10-CM

## 2022-04-29 DIAGNOSIS — E559 Vitamin D deficiency, unspecified: Secondary | ICD-10-CM

## 2022-04-29 DIAGNOSIS — F411 Generalized anxiety disorder: Secondary | ICD-10-CM

## 2022-04-29 MED ORDER — ESZOPICLONE 2 MG PO TABS: ORAL_TABLET | ORAL | 0 refills | Status: DC

## 2022-04-29 NOTE — Assessment & Plan Note (Addendum)
Chronic Regular exercise and healthy diet encouraged LDL very high-likely genetic Coronary calcium score 0 in 2020 ASCVD risk low Discussed pros and cons of starting a statin Will try red yeast rice

## 2022-04-29 NOTE — Assessment & Plan Note (Signed)
Chronic dexa up to date Continue calcium and vitamin supplementation Continue regular exercise

## 2022-04-29 NOTE — Assessment & Plan Note (Signed)
Chronic °Controlled, stable °Continue celexa 20 mg daily ° °

## 2022-04-29 NOTE — Assessment & Plan Note (Addendum)
Chronic Lab Results  Component Value Date   HGBA1C 5.8 04/24/2022    Low sugar / carb diet Stressed regular exercise

## 2022-04-29 NOTE — Assessment & Plan Note (Addendum)
Chronic Taking vitamin D daily  Last vitamin D Lab Results  Component Value Date   VD25OH 47.99 04/24/2022

## 2022-04-29 NOTE — Assessment & Plan Note (Signed)
Chronic Controlled, stable Continue lunesta 2 mg HSprn - not not use frequently

## 2022-04-29 NOTE — Assessment & Plan Note (Addendum)
Chronic  Clinically euthyroid Currently taking levothyroxine 75 mcg TSH in normal range Continue current dose

## 2022-05-01 ENCOUNTER — Other Ambulatory Visit: Payer: 59

## 2022-05-01 DIAGNOSIS — E782 Mixed hyperlipidemia: Secondary | ICD-10-CM

## 2022-05-02 LAB — NMR, LIPOPROFILE
Cholesterol, Total: 284 mg/dL — ABNORMAL HIGH (ref 100–199)
HDL Particle Number: 42.2 umol/L (ref >=30.5)
HDL-C: 76 mg/dL (ref >39)
LDL Particle Number: 1896 nmol/L — ABNORMAL HIGH (ref <1000)
LDL Size: 21.7 nm (ref >20.5)
LDL-C (NIH Calc): 195 mg/dL — ABNORMAL HIGH (ref 0–99)
LP-IR Score: <25 (ref <=45)
Small LDL Particle Number: 452 nmol/L (ref <=527)
Triglycerides: 81 mg/dL (ref 0–149)

## 2022-05-07 ENCOUNTER — Encounter: Payer: Self-pay | Admitting: Internal Medicine

## 2022-05-08 MED ORDER — CITALOPRAM HYDROBROMIDE 10 MG PO TABS: 10.0000 mg | ORAL_TABLET | Freq: Every day | ORAL | 3 refills | Status: DC

## 2022-07-22 NOTE — Progress Notes (Deleted)
Cardiology Office Note:    Date:  07/22/2022   ID:  DENEA Parker, DOB 1961/07/20, MRN [id number]  PCP:  Binnie Rail, MD   [place] Providers Cardiologist:  None {  Referring MD: Binnie Rail, MD    History of Present Illness:    Jeanne Parker is a 61 y.o. female with a hx of prediabetes, HLD, anxiety, and breast cancer who was referred by Dr. Quay Burow for further evaluation HLD.  Patient with history of Ca score 0 in 11/2018. Had NMR panel in 05/01/22 which showed LDL particle number 1896, LDL 195, HDL 76, TG 81.   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   Cataract    OU   History of chicken pox    History of colon polyps    Hypothyroid    Personal history of radiation therapy     Past Surgical History:  Procedure Laterality Date   BREAST BIOPSY     BREAST EXCISIONAL BIOPSY Left 1998   BREAST LUMPECTOMY Right 2016   BREAST SURGERY  1998   fibroid adenoma    RADIOACTIVE SEED GUIDED PARTIAL MASTECTOMY WITH AXILLARY SENTINEL LYMPH NODE BIOPSY Right 11/16/2015   Procedure: RADIOACTIVE SEED GUIDED PARTIAL MASTECTOMY WITH AXILLARY SENTINEL LYMPH NODE BIOPSY;  Surgeon: Erroll Luna, MD;  Location: [facility];  Service: General;  Laterality: Right;    Current Medications: No outpatient medications have been marked as taking for the 07/24/22 encounter (Appointment) with Freada Bergeron, MD.     Allergies:   Patient has no known allergies.   Social History   Socioeconomic History   Marital status: Married    Spouse name: Not on file   Number of children: 3   Years of education: Not on file   Highest education level: Not on file  Occupational History   Occupation: Engineer, maintenance    Comment: Pharmacist, community  Tobacco Use   Smoking status: Never   Smokeless tobacco: Never  Vaping Use   Vaping Use: Never used   Substance and Sexual Activity   Alcohol use: Yes    Comment: 1 to 2 glasses of wine a week    Drug use: No   Sexual activity: Not on file  Other Topics Concern   Not on file  Social History Narrative   Not on file   Social Determinants of Health   Financial Resource Strain: Not on file  Food Insecurity: Not on file  Transportation Needs: Not on file  Physical Activity: Not on file  Stress: Not on file  Social Connections: Not on file     Family History: The patient's ***family history includes Atrial fibrillation in her father; Breast cancer in her cousin; Colon cancer in her paternal grandfather; Coronary artery disease in her father; Diabetes (age of onset: 52) in her father; GER disease in her father; Gout in her brother; Hyperlipidemia in her father and mother; Hypertension in her father; Hypertension (age of onset: 3) in her mother; Thyroid cancer in her sister; Thyroid nodules in her mother.  ROS:   Please see the history of present illness.    *** All other systems reviewed and are negative.  EKGs/Labs/Other Studies Reviewed:    The following studies were reviewed today: 12/18/18 Ca score: FINDINGS: Non-cardiac: See separate report from [facility].  Ascending aorta: Normal diameter 3.0 cm   Pericardium: Normal   Coronary arteries: No calcium noted   IMPRESSION: Coronary calcium score of 0.  FINDINGS: Vascular: Normal aortic caliber.   Mediastinum/Nodes: No imaged thoracic adenopathy.   Lungs/Pleura: No imaged pleural fluid. Clear imaged lungs.   Upper Abdomen: Normal imaged portions of the liver, spleen, stomach.   Musculoskeletal: No acute osseous abnormality.   IMPRESSION: No acute extracardiac findings in the imaged chest.  EKG:  EKG is *** ordered today.  The ekg ordered today demonstrates ***  Recent Labs: 04/24/2022: ALT 20; BUN 15; Creatinine, Ser 0.81; Hemoglobin 13.0; Platelets 293.0; Potassium 4.3; Sodium 138; TSH 1.50  Recent  Lipid Panel    Component Value Date/Time   CHOL 303 (H) 04/24/2022 0807   TRIG 89.0 04/24/2022 0807   HDL 75.80 04/24/2022 0807   CHOLHDL 4 04/24/2022 0807   VLDL 17.8 04/24/2022 0807   LDLCALC 209 (H) 04/24/2022 0807   LDLDIRECT 158.7 01/04/2013 1054     Risk Assessment/Calculations:   {Does this patient have ATRIAL FIBRILLATION?:[phone]}  No BP recorded.  {Refresh Note OR Click here to enter BP  :1}***         Physical Exam:    VS:  LMP 08/31/2015 (Exact Date)     Wt Readings from Last 3 Encounters:  04/29/22 133 lb (60.3 kg)  11/23/21 134 lb 3 oz (60.9 kg)  06/07/21 129 lb (58.5 kg)     GEN: *** Well nourished, well developed in no acute distress HEENT: Normal NECK: No JVD; No carotid bruits LYMPHATICS: No lymphadenopathy CARDIAC: ***RRR, no murmurs, rubs, gallops RESPIRATORY:  Clear to auscultation without rales, wheezing or rhonchi  ABDOMEN: Soft, non-tender, non-distended MUSCULOSKELETAL:  No edema; No deformity  SKIN: Warm and dry NEUROLOGIC:  Alert and oriented x 3 PSYCHIATRIC:  Normal affect   ASSESSMENT:    No diagnosis found. PLAN:    In order of problems listed above:  #HLD: LDL 195, HDL 76. TG 81. Ca score 0 in 2020.  -Check Lp (a) -Recommend initiation of crestor  -??? Lipid clinic      {Are you ordering a CV Procedure (e.g. stress test, cath, DCCV, TEE, etc)?   Press F2        :[id number]}    Medication Adjustments/Labs and Tests Ordered: Current medicines are reviewed at length with the patient today.  Concerns regarding medicines are outlined above.  No orders of the defined types were placed in this encounter.  No orders of the defined types were placed in this encounter.   There are no Patient Instructions on file for this visit.   Signed, Freada Bergeron, MD  07/22/2022 8:21 PM    [place]

## 2022-07-24 ENCOUNTER — Ambulatory Visit: Payer: 59 | Admitting: Cardiology

## 2022-08-07 ENCOUNTER — Telehealth: Payer: 59 | Admitting: Physician Assistant

## 2022-08-07 DIAGNOSIS — J028 Acute pharyngitis due to other specified organisms: Secondary | ICD-10-CM

## 2022-08-07 DIAGNOSIS — B9689 Other specified bacterial agents as the cause of diseases classified elsewhere: Secondary | ICD-10-CM | POA: Diagnosis not present

## 2022-08-07 MED ORDER — AZITHROMYCIN 250 MG PO TABS: ORAL_TABLET | ORAL | 0 refills | Status: AC

## 2022-08-07 NOTE — Progress Notes (Signed)
E-Visit for Sore Throat  We are sorry that you are not feeling well.  Here is how we plan to help!  Based on what you have shared with me it is likely that you have bacterial pharyngitis.  Bacterial pharyngitis is inflammation and infection in the back of the throat.  This is an infection cause by bacteria and is treated with antibiotics.  I have prescribed Azithromycin 250 mg two tablets today and then one daily for 4 additional days. For throat pain, we recommend over the counter oral pain relief medications such as acetaminophen or aspirin, or anti-inflammatory medications such as ibuprofen or naproxen sodium. Topical treatments such as oral throat lozenges or sprays may be used as needed. Bacterial infections are not as easily transmitted as other respiratory infections, however we still recommend that you avoid close contact with loved ones, especially the very young and elderly.  Remember to wash your hands thoroughly throughout the day as this is the number one way to prevent the spread of infection and wipe down door knobs and counters with disinfectant.   Home Care: Only take medications as instructed by your medical team. Complete the entire course of an antibiotic. Do not take these medications with alcohol. A steam or ultrasonic humidifier can help congestion.  You can place a towel over your head and breathe in the steam from hot water coming from a faucet. Avoid close contacts especially the very young and the elderly. Cover your mouth when you cough or sneeze. Always remember to wash your hands.  Get Help Right Away If: You develop worsening fever or sinus pain. You develop a severe head ache or visual changes. Your symptoms persist after you have completed your treatment plan.  Make sure you Understand these instructions. Will watch your condition. Will get help right away if you are not doing well or get worse.   Thank you for choosing an e-visit.  Your e-visit answers  were reviewed by a board certified advanced clinical practitioner to complete your personal care plan. Depending upon the condition, your plan could have included both over the counter or prescription medications.  Please review your pharmacy choice. Make sure the pharmacy is open so you can pick up prescription now. If there is a problem, you may contact your provider through [employer] and have the prescription routed to another pharmacy.  Your safety is important to [place]. If you have drug allergies check your prescription carefully.   For the next 24 hours you can use MyChart to ask questions about today's visit, request a non-urgent call back, or ask for a work or school excuse. You will get an email in the next two days asking about your experience. I hope that your e-visit has been valuable and will speed your recovery.  I provided 5 minutes of non face-to-face time during this encounter for chart review and documentation.

## 2022-08-22 NOTE — Progress Notes (Deleted)
Cardiology Office Note:    Date:  08/22/2022   ID:  Jeanne Parker, DOB May 24, 1961, MRN [id number]  PCP:  Binnie Rail, MD   [place] Providers Cardiologist:  None {  Referring MD: Binnie Rail, MD    History of Present Illness:    Jeanne Parker is a 61 y.o. female with a hx of prediabetes, HLD, anxiety, and breast cancer who was referred by Dr. Quay Burow for further evaluation HLD.  Patient with history of Ca score 0 in 11/2018. Had NMR panel in 05/01/22 which showed LDL particle number 1896, LDL 195, HDL 76, TG 81.   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   Cataract    OU   History of chicken pox    History of colon polyps    Hypothyroid    Personal history of radiation therapy     Past Surgical History:  Procedure Laterality Date   BREAST BIOPSY     BREAST EXCISIONAL BIOPSY Left 1998   BREAST LUMPECTOMY Right 2016   BREAST SURGERY  1998   fibroid adenoma    RADIOACTIVE SEED GUIDED PARTIAL MASTECTOMY WITH AXILLARY SENTINEL LYMPH NODE BIOPSY Right 11/16/2015   Procedure: RADIOACTIVE SEED GUIDED PARTIAL MASTECTOMY WITH AXILLARY SENTINEL LYMPH NODE BIOPSY;  Surgeon: Erroll Luna, MD;  Location: [place];  Service: General;  Laterality: Right;    Current Medications: No outpatient medications have been marked as taking for the 08/27/22 encounter (Appointment) with Freada Bergeron, MD.     Allergies:   Patient has no known allergies.   Social History   Socioeconomic History   Marital status: Married    Spouse name: Not on file   Number of children: 3   Years of education: Not on file   Highest education level: Not on file  Occupational History   Occupation: Engineer, maintenance    Comment: Pharmacist, community  Tobacco Use   Smoking status: Never   Smokeless tobacco: Never  Vaping Use   Vaping Use: Never used   Substance and Sexual Activity   Alcohol use: Yes    Comment: 1 to 2 glasses of wine a week    Drug use: No   Sexual activity: Not on file  Other Topics Concern   Not on file  Social History Narrative   Not on file   Social Determinants of Health   Financial Resource Strain: Not on file  Food Insecurity: Not on file  Transportation Needs: Not on file  Physical Activity: Not on file  Stress: Not on file  Social Connections: Not on file     Family History: The patient's ***family history includes Atrial fibrillation in her father; Breast cancer in her cousin; Colon cancer in her paternal grandfather; Coronary artery disease in her father; Diabetes (age of onset: 89) in her father; GER disease in her father; Gout in her brother; Hyperlipidemia in her father and mother; Hypertension in her father; Hypertension (age of onset: 24) in her mother; Thyroid cancer in her sister; Thyroid nodules in her mother.  ROS:   Please see the history of present illness.    *** All other systems reviewed and are negative.  EKGs/Labs/Other Studies Reviewed:    The following studies were reviewed today: 12/18/18 Ca score: FINDINGS: Non-cardiac: See separate report from [facility].  Ascending aorta: Normal diameter 3.0 cm   Pericardium: Normal   Coronary arteries: No calcium noted   IMPRESSION: Coronary calcium score of 0.  FINDINGS: Vascular: Normal aortic caliber.   Mediastinum/Nodes: No imaged thoracic adenopathy.   Lungs/Pleura: No imaged pleural fluid. Clear imaged lungs.   Upper Abdomen: Normal imaged portions of the liver, spleen, stomach.   Musculoskeletal: No acute osseous abnormality.   IMPRESSION: No acute extracardiac findings in the imaged chest.  EKG:  EKG is *** ordered today.  The ekg ordered today demonstrates ***  Recent Labs: 04/24/2022: ALT 20; BUN 15; Creatinine, Ser 0.81; Hemoglobin 13.0; Platelets 293.0; Potassium 4.3; Sodium 138; TSH 1.50  Recent  Lipid Panel    Component Value Date/Time   CHOL 303 (H) 04/24/2022 0807   TRIG 89.0 04/24/2022 0807   HDL 75.80 04/24/2022 0807   CHOLHDL 4 04/24/2022 0807   VLDL 17.8 04/24/2022 0807   LDLCALC 209 (H) 04/24/2022 0807   LDLDIRECT 158.7 01/04/2013 1054     Risk Assessment/Calculations:   {Does this patient have ATRIAL FIBRILLATION?:[phone]}  No BP recorded.  {Refresh Note OR Click here to enter BP  :1}***         Physical Exam:    VS:  LMP 08/31/2015 (Exact Date)     Wt Readings from Last 3 Encounters:  04/29/22 133 lb (60.3 kg)  11/23/21 134 lb 3 oz (60.9 kg)  06/07/21 129 lb (58.5 kg)     GEN: *** Well nourished, well developed in no acute distress HEENT: Normal NECK: No JVD; No carotid bruits LYMPHATICS: No lymphadenopathy CARDIAC: ***RRR, no murmurs, rubs, gallops RESPIRATORY:  Clear to auscultation without rales, wheezing or rhonchi  ABDOMEN: Soft, non-tender, non-distended MUSCULOSKELETAL:  No edema; No deformity  SKIN: Warm and dry NEUROLOGIC:  Alert and oriented x 3 PSYCHIATRIC:  Normal affect   ASSESSMENT:    No diagnosis found. PLAN:    In order of problems listed above:  #HLD: LDL 195, HDL 76. TG 81. Ca score 0 in 2020.  Likely familial HLD as patient lives a very healthy lifestyle with diet and exercise. Given LDL>190, she merits lipid lowering therapy. Will start crestor '10mg'$  daily and uptitrate as tolerated. Likely will need PCSK9i and will refer to lipid clinic.  -Check Lp (a) and apolipoprotein B -Start crestor '10mg'$  daily and uptitrate as tolerated -Likely needs PCSK9i; will refer to lipid clinic -Continue healthy lifestyle modifications           Medication Adjustments/Labs and Tests Ordered: Current medicines are reviewed at length with the patient today.  Concerns regarding medicines are outlined above.  No orders of the defined types were placed in this encounter.  No orders of the defined types were placed in this  encounter.   There are no Patient Instructions on file for this visit.   Signed, Freada Bergeron, MD  08/22/2022 1:53 PM    [place]

## 2022-08-27 ENCOUNTER — Ambulatory Visit: Payer: 59 | Attending: Cardiology | Admitting: Cardiology

## 2022-08-27 ENCOUNTER — Encounter: Payer: Self-pay | Admitting: Cardiology

## 2022-08-27 VITALS — BP 120/70 | HR 70 | Ht 65.0 in | Wt 134.0 lb

## 2022-08-27 DIAGNOSIS — Z79899 Other long term (current) drug therapy: Secondary | ICD-10-CM | POA: Diagnosis not present

## 2022-08-27 DIAGNOSIS — E7849 Other hyperlipidemia: Secondary | ICD-10-CM

## 2022-08-27 DIAGNOSIS — E782 Mixed hyperlipidemia: Secondary | ICD-10-CM

## 2022-08-27 DIAGNOSIS — R7303 Prediabetes: Secondary | ICD-10-CM | POA: Diagnosis not present

## 2022-08-27 MED ORDER — ROSUVASTATIN CALCIUM 10 MG PO TABS: 10.0000 mg | ORAL_TABLET | Freq: Every day | ORAL | 2 refills | Status: DC

## 2022-08-27 NOTE — Patient Instructions (Signed)
Medication Instructions:   START TAKING ROSUVASTATIN (CRESTOR) 10 MG BY MOUTH DAILY  *If you need a refill on your cardiac medications before your next appointment, please call your pharmacy*   You have been referred to [facility]   Lab Work:  TODAY--NMR LIPOPROFILE, LIPOPROTEIN A, AND APOLIPOPROTEIN B  If you have labs (blood work) drawn today and your tests are completely normal, you will receive your results only by: [place] (if you have MyChart) OR A paper copy in the mail If you have any lab test that is abnormal or we need to change your treatment, we will call you to review the results.     Follow-Up: At [facility], you and your health needs are our priority.  As part of our continuing mission to provide you with exceptional heart care, we have created designated Provider Care Teams.  These Care Teams include your primary Cardiologist (physician) and Advanced Practice Providers (APPs -  Physician Assistants and Nurse Practitioners) who all work together to provide you with the care you need, when you need it.  We recommend signing up for the patient portal called "MyChart".  Sign up information is provided on this After Visit Summary.  MyChart is used to connect with patients for Virtual Visits (Telemedicine).  Patients are able to view lab/test results, encounter notes, upcoming appointments, etc.  Non-urgent messages can be sent to your provider as well.   To learn more about what you can do with MyChart, go to [email or web address].    Your next appointment:   6 month(s)  The format for your next appointment:   In Person  Provider:   DR. Johney Frame   Important Information About [place]

## 2022-08-27 NOTE — Progress Notes (Signed)
Cardiology Office Note:    Date:  08/27/2022   ID:  Jeanne Parker, DOB 12-25-1960, MRN [id number]  PCP:  Binnie Rail, MD   [facility] Providers Cardiologist:  None {  Referring MD: Binnie Rail, MD    History of Present Illness:    Jeanne Parker is a 61 y.o. female with a hx of prediabetes, HLD, anxiety, and breast cancer who was referred by Dr. Quay Burow for further evaluation HLD.  Patient with history of Ca score 0 in 11/2018. Had NMR panel in 05/01/22 which showed LDL particle number 1896, LDL 195, HDL 76, TG 81.   Today, she presents with concerns about her high cholesterol levels. As of 04/24/2022 her total cholesterol was 303. She has never been on a statin, but recently started red yeast rice. Her cholesterol has increased despite her health efforts. She has concerns about starting medications for cholesterol because of it possibly affecting her exercise and myalgias.  She exercises (pilates, running on the weekends, and hiking) and eats well regularly. She tries to maintain a low carb and low sugar diet and consumes foods like chicken, fish, and salads. She typically avoids fried foods and foods that are greasy, like bacon. She also tries to avoid sweets like cookies. She only drinks alcohol on occasion and when she does it is only about 2 glasses of wine.   Her father had high cholesterol, hypertension, and lived until 32 yo. Her mother is 4 and is very active such as playing golf. Her mother recently had "blood cancer" that is being managed with medications.  She is currently taking a 10 mg dosage of citalopram but plans to try cutting back on it.  She denies any palpitations, chest pain, shortness of breath, or peripheral edema. No lightheadedness, headaches, syncope, orthopnea, or PND.   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   Cataract    OU   History of  chicken pox    History of colon polyps    Hypothyroid    Personal history of radiation therapy     Past Surgical History:  Procedure Laterality Date   BREAST BIOPSY     BREAST EXCISIONAL BIOPSY Left 1998   BREAST LUMPECTOMY Right 2016   BREAST SURGERY  1998   fibroid adenoma    RADIOACTIVE SEED GUIDED PARTIAL MASTECTOMY WITH AXILLARY SENTINEL LYMPH NODE BIOPSY Right 11/16/2015   Procedure: RADIOACTIVE SEED GUIDED PARTIAL MASTECTOMY WITH AXILLARY SENTINEL LYMPH NODE BIOPSY;  Surgeon: Erroll Luna, MD;  Location: [facility];  Service: General;  Laterality: Right;    Current Medications: Current Meds  Medication Sig   citalopram (CELEXA) 10 MG tablet Take 1 tablet (10 mg total) by mouth daily.   cycloSPORINE (RESTASIS) 0.05 % ophthalmic emulsion Place into both eyes 2 (two) times daily.    eszopiclone (LUNESTA) 2 MG TABS tablet TAKE 1 TABLET BY MOUTH AT  BEDTIME AS NEEDED FOR SLEEP IMMEDIATELY BEFORE BEDTIME.   levothyroxine (SYNTHROID) 75 MCG tablet TAKE 1 TABLET BY MOUTH  DAILY BEFORE BREAKFAST   Multiple Vitamin (MULTIVITAMIN) capsule Take 1 capsule by mouth daily.   rosuvastatin (CRESTOR) 10 MG tablet Take 1 tablet (10 mg total) by mouth daily.   timolol (TIMOPTIC) 0.5 % ophthalmic solution 1 drop 2 (two) times daily.   tretinoin (RETIN-A) 0.025 % cream Apply topically at bedtime.  Allergies:   Patient has no known allergies.   Social History   Socioeconomic History   Marital status: Married    Spouse name: Not on file   Number of children: 3   Years of education: Not on file   Highest education level: Not on file  Occupational History   Occupation: Engineer, maintenance    Comment: Pharmacist, community  Tobacco Use   Smoking status: Never   Smokeless tobacco: Never  Vaping Use   Vaping Use: Never used  Substance and Sexual Activity   Alcohol use: Yes    Comment: 1 to 2 glasses of wine a week    Drug use: No   Sexual activity: Not on file  Other  Topics Concern   Not on file  Social History Narrative   Not on file   Social Determinants of Health   Financial Resource Strain: Not on file  Food Insecurity: Not on file  Transportation Needs: Not on file  Physical Activity: Not on file  Stress: Not on file  Social Connections: Not on file     Family History: The patient's family history includes Atrial fibrillation in her father; Breast cancer in her cousin; Colon cancer in her paternal grandfather; Coronary artery disease in her father; Diabetes (age of onset: 35) in her father; GER disease in her father; Gout in her brother; Hyperlipidemia in her father and mother; Hypertension in her father; Hypertension (age of onset: 83) in her mother; Thyroid cancer in her sister; Thyroid nodules in her mother.  ROS:   Review of Systems  Constitutional:  Negative for chills and fever.  HENT:  Negative for nosebleeds and tinnitus.   Eyes:  Negative for blurred vision and photophobia.  Respiratory:  Negative for cough.   Cardiovascular:  Negative for chest pain, palpitations, orthopnea, claudication, leg swelling and PND.  Gastrointestinal:  Negative for heartburn and vomiting.  Genitourinary:  Negative for dysuria and frequency.  Musculoskeletal:  Negative for back pain and myalgias.  Neurological:  Negative for dizziness and headaches.  Psychiatric/Behavioral:  Negative for hallucinations and suicidal ideas.      EKGs/Labs/Other Studies Reviewed:    The following studies were reviewed today:  12/18/18 Ca score: FINDINGS: Non-cardiac: See separate report from [facility].   Ascending aorta: Normal diameter 3.0 cm   Pericardium: Normal   Coronary arteries: No calcium noted   IMPRESSION: Coronary calcium score of 0.  FINDINGS: Vascular: Normal aortic caliber.   Mediastinum/Nodes: No imaged thoracic adenopathy.   Lungs/Pleura: No imaged pleural fluid. Clear imaged lungs.   Upper Abdomen: Normal imaged portions of  the liver, spleen, stomach.   Musculoskeletal: No acute osseous abnormality.   IMPRESSION: No acute extracardiac findings in the imaged chest.  EKG:  EKG is personally reviewed. 08/27/2022: Sinus rhythm. Rate 72 bpm.   Recent Labs: 04/24/2022: ALT 20; BUN 15; Creatinine, Ser 0.81; Hemoglobin 13.0; Platelets 293.0; Potassium 4.3; Sodium 138; TSH 1.50  Recent Lipid Panel    Component Value Date/Time   CHOL 303 (H) 04/24/2022 0807   TRIG 89.0 04/24/2022 0807   HDL 75.80 04/24/2022 0807   CHOLHDL 4 04/24/2022 0807   VLDL 17.8 04/24/2022 0807   LDLCALC 209 (H) 04/24/2022 0807   LDLDIRECT 158.7 01/04/2013 1054     Risk Assessment/Calculations:                Physical Exam:    VS:  BP 120/70   Pulse 70   Ht '5\' 5"'$  (1.651 m)  Wt 134 lb (60.8 kg)   LMP 08/31/2015 (Exact Date)   SpO2 98%   BMI 22.30 kg/m     Wt Readings from Last 3 Encounters:  08/27/22 134 lb (60.8 kg)  04/29/22 133 lb (60.3 kg)  11/23/21 134 lb 3 oz (60.9 kg)     GEN: Well nourished, well developed in no acute distress HEENT: Normal NECK: No JVD; No carotid bruits CARDIAC: RRR, no murmurs, rubs, gallops RESPIRATORY:  Clear to auscultation without rales, wheezing or rhonchi  ABDOMEN: Soft, non-tender, non-distended MUSCULOSKELETAL:  No edema; No deformity  SKIN: Warm and dry NEUROLOGIC:  Alert and oriented x 3 PSYCHIATRIC:  Normal affect   ASSESSMENT:    1. Familial hyperlipidemia   2. Medication management   3. Mixed hyperlipidemia   4. Prediabetes    PLAN:    In order of problems listed above:  #HLD: LDL 195, HDL 76. TG 81. Ca score 0 in 2020.  Likely familial HLD as patient lives a very healthy lifestyle with diet and exercise. Given LDL>190, she merits lipid lowering therapy. Will start crestor '10mg'$  daily and uptitrate as tolerated. Likely will need PCSK9i and will refer to lipid clinic.  -Check Lp (a) and apolipoprotein B -Start crestor '10mg'$  daily and uptitrate as tolerated -Likely  needs PCSK9i; will refer to lipid clinic -Continue healthy lifestyle modifications         Follow up in 6 months.  Medication Adjustments/Labs and Tests Ordered: Current medicines are reviewed at length with the patient today.  Concerns regarding medicines are outlined above.  Orders Placed This Encounter  Procedures   NMR, lipoprofile   Apolipoprotein B   Lipoprotein A (LPA)   AMB Referral to [facility]   EKG 12-Lead   Meds ordered this encounter  Medications   rosuvastatin (CRESTOR) 10 MG tablet    Sig: Take 1 tablet (10 mg total) by mouth daily.    Dispense:  90 tablet    Refill:  2    Patient Instructions  Medication Instructions:   START TAKING ROSUVASTATIN (CRESTOR) 10 MG BY MOUTH DAILY  *If you need a refill on your cardiac medications before your next appointment, please call your pharmacy*   You have been referred to [facility]   Lab Work:  TODAY--NMR LIPOPROFILE, LIPOPROTEIN A, AND APOLIPOPROTEIN B  If you have labs (blood work) drawn today and your tests are completely normal, you will receive your results only by: [place] (if you have MyChart) OR A paper copy in the mail If you have any lab test that is abnormal or we need to change your treatment, we will call you to review the results.     Follow-Up: At [facility], you and your health needs are our priority.  As part of our continuing mission to provide you with exceptional heart care, we have created designated Provider Care Teams.  These Care Teams include your primary Cardiologist (physician) and Advanced Practice Providers (APPs -  Physician Assistants and Nurse Practitioners) who all work together to provide you with the care you need, when you need it.  We recommend signing up for the patient portal called "MyChart".  Sign up information is provided on this After Visit Summary.  MyChart is used to connect with patients for Virtual Visits  (Telemedicine).  Patients are able to view lab/test results, encounter notes, upcoming appointments, etc.  Non-urgent messages can be sent to your provider as well.   To learn  more about what you can do with MyChart, go to [email or web address].    Your next appointment:   6 month(s)  The format for your next appointment:   In Person  Provider:   DR. Johney Frame   Important Information About Sugar         I,Rachel Rivera,acting as a scribe for Freada Bergeron, MD.,have documented all relevant documentation on the behalf of Freada Bergeron, MD,as directed by  Freada Bergeron, MD while in the presence of Freada Bergeron, MD.   I, Freada Bergeron, MD, have reviewed all documentation for this visit. The documentation on 08/27/22 for the exam, diagnosis, procedures, and orders are all accurate and complete.   Signed, Freada Bergeron, MD  08/27/2022 5:01 PM    [place] [place]

## 2022-08-28 LAB — NMR, LIPOPROFILE
Cholesterol, Total: 321 mg/dL — ABNORMAL HIGH (ref 100–199)
HDL Particle Number: 41.7 umol/L (ref >=30.5)
HDL-C: 75 mg/dL (ref >39)
LDL Particle Number: 2450 nmol/L — ABNORMAL HIGH (ref <1000)
LDL Size: 21.3 nm (ref >20.5)
LDL-C (NIH Calc): 200 mg/dL — ABNORMAL HIGH (ref 0–99)
LP-IR Score: 47 — ABNORMAL HIGH (ref <=45)
Small LDL Particle Number: 663 nmol/L — ABNORMAL HIGH (ref <=527)
Triglycerides: 237 mg/dL — ABNORMAL HIGH (ref 0–149)

## 2022-08-28 LAB — LIPOPROTEIN A (LPA): Lipoprotein (a): 264.3 nmol/L — ABNORMAL HIGH (ref <75.0)

## 2022-08-28 LAB — APOLIPOPROTEIN B: Apolipoprotein B: 153 mg/dL — ABNORMAL HIGH (ref <90)

## 2022-09-02 ENCOUNTER — Encounter: Payer: Self-pay | Admitting: Pharmacist

## 2022-09-02 ENCOUNTER — Ambulatory Visit: Payer: 59 | Attending: Internal Medicine | Admitting: Pharmacist

## 2022-09-02 DIAGNOSIS — E782 Mixed hyperlipidemia: Secondary | ICD-10-CM | POA: Diagnosis not present

## 2022-09-02 MED ORDER — ROSUVASTATIN CALCIUM 20 MG PO TABS: 20.0000 mg | ORAL_TABLET | Freq: Every day | ORAL | 3 refills | Status: DC

## 2022-09-02 MED ORDER — EZETIMIBE 10 MG PO TABS: 10.0000 mg | ORAL_TABLET | Freq: Every day | ORAL | 3 refills | Status: DC

## 2022-09-02 NOTE — Patient Instructions (Addendum)
Please increase your rosuvastatin to '20mg'$  daily.  Start taking ezetimibe '10mg'$  daily Please come on Jan 4 for fasting blood work. The lab is open from 7:15 AM to 5PM Try to add in some resistance /strength (weight) training 2-3 times a week. Could consider Zone 2 training a few days a week for metabolic health  Please call me at [phone] with any questions or concerns.

## 2022-09-02 NOTE — Progress Notes (Signed)
Patient ID: Jeanne Parker                 DOB: [birth year]                    MRN: [id number]      HPI: Jeanne Parker is a 61 y.o. female patient referred to lipid clinic by Dr. Johney Frame. PMH is significant for prediabetes, HLD, anxiety, and breast cancer. Patient with history of Ca score 0 in 11/2018. Had NMR panel in 05/01/22 which showed LDL particle number 1896, LDL 195, HDL 76, TG 81. She saw Dr. Johney Frame on 10/3. She was started on rosuvastatin '10mg'$  daily and referred to lipid clinic due to presumed FH and possible need for PCSK9i.   Patient presents today to lipid clinic. Started rosuvastatin '10mg'$  daily about 5 days ago. Doing fine. Also taking CoQ10. A little concerned about reading it could increase blood sugar.  Does piliates 2-3 times a week and goes for a run on Saturdays. Would like to do more cardio but pressed for time. Her father had angioplasty in his 17's or early 10's. Was on cholesterol since his 40's.  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  Eats a diet low in sugar, drinks ETOH only on weekend, limited to 1-2 drinks. No fried foods, no bread. 2% dairy.   Current Medications: rosuvastatin '10mg'$  daily Intolerances: none Risk Factors: family hx of CAD, LDL-C >190 LDL goal: <70  Diet:  Breakfast: 2% [place] yogurt w/ berries or 2 eggs Lunch: tuna, chicken, vegetables Dinner:   Exercise: piliates 2-3 times a week, running on the Saturdays, and hiking  Family History: The patient's family history includes Atrial fibrillation in her father; Breast cancer in her cousin; Colon cancer in her paternal grandfather; Coronary artery disease in her father; Diabetes (age of onset: 58) in her father; GER disease in her father; Gout in her brother; Hyperlipidemia in her father and mother; Hypertension in her father; Hypertension (age of onset: 33) in her mother; Thyroid cancer in her sister; Thyroid nodules in her mother  Social History: ETOH on  weekend, no tobacco use  Labs: 08/27/22 LPa 264, ApoB 153 LDL-P 2450, LDL-C 200, TG 237 (no medications)  Past Medical History:  Diagnosis Date   Anxiety    situational   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 30 tablet 3   cycloSPORINE (RESTASIS) 0.05 % ophthalmic emulsion Place into both eyes 2 (two) times daily.      eszopiclone (LUNESTA) 2 MG TABS tablet TAKE 1 TABLET BY MOUTH AT  BEDTIME AS NEEDED FOR SLEEP IMMEDIATELY BEFORE BEDTIME. 90 tablet 0   levothyroxine (SYNTHROID) 75 MCG tablet TAKE 1 TABLET BY MOUTH  DAILY BEFORE BREAKFAST 90 tablet 3   Multiple Vitamin (MULTIVITAMIN) capsule Take 1 capsule by mouth daily.     timolol (TIMOPTIC) 0.5 % ophthalmic solution 1 drop 2 (two) times daily.     tretinoin (RETIN-A) 0.025 % cream Apply topically at bedtime. 45 g 0   No current facility-administered  medications on file prior to visit.    No Known Allergies  Assessment/Plan:  1. Hyperlipidemia -   Hyperlipidemia Assessment: Significantly elevated LDL-C (200), LDL-P (2450), ApoB (153) TG also elevated (237) LDL-C goal <70, ApoB <80 (at the very minimum) per AACE guidelines Has healthy diet and active lifestyle Insurance requires 12 weeks of max statin and ezetimibe use before [employer] approval Insurance has cut off of LDL-C <100 for HF- may require written appeal if decide PCSK9i is needed after ezetimibe and statin Pathophysiology, lab results, diet, exercise reviewed in detail with patient PCKS9i injection technique, effect on LPa (not unknown benefit), side effects reviewed  Plan:  Increase rosuvastatin to '20mg'$  daily Add ezetimibe '10mg'$   daily Recheck labs (NMR, ApoB, LFT and A1C) in 12 weeks (insurance requires 12 week trial) If lipids not where we would like them will pursue PCSK9i- may require an appeals to insurance Follow up with patient once labs result (lab apt scheduled for 11/28/22)       Thank you,  Ramond Dial, Pharm.D, BCPS, CPP [facility] A [facility] of [facility] [address] [place], [place] [address]  Phone: [phone]; Fax: [phone]

## 2022-09-02 NOTE — Assessment & Plan Note (Addendum)
Assessment:  Significantly elevated LDL-C (200), LDL-P (2450), ApoB (153)  TG also elevated (237)  LDL-C goal <70, ApoB <80 (at the very minimum) per AACE guidelines  Has healthy diet and active lifestyle  Insurance requires 12 weeks of max statin and ezetimibe use before [employer] approval  Insurance has cut off of LDL-C <100 for HF- may require written appeal if decide PCSK9i is needed after ezetimibe and statin  Pathophysiology, lab results, diet, exercise reviewed in detail with patient  PCKS9i injection technique, effect on LPa (not unknown benefit), side effects reviewed  Plan:   Increase rosuvastatin to '20mg'$  daily  Add ezetimibe '10mg'$  daily  Recheck labs (NMR, ApoB, LFT and A1C) in 12 weeks (insurance requires 12 week trial)  If lipids not where we would like them will pursue PCSK9i- may require an appeals to insurance  Follow up with patient once labs result (lab apt scheduled for 11/28/22)

## 2022-10-04 ENCOUNTER — Ambulatory Visit: Payer: 59

## 2022-10-13 ENCOUNTER — Encounter: Payer: Self-pay | Admitting: Cardiology

## 2022-10-14 MED ORDER — REPATHA 140 MG/ML ~~LOC~~ SOSY: 140.0000 mg | PREFILLED_SYRINGE | SUBCUTANEOUS | 3 refills | Status: DC

## 2022-10-14 NOTE — Telephone Encounter (Addendum)
PA approved through 10/15/2023. Patient has been informed and prescription has been sent to the patient's preferred pharmacy.  Patient has been educated for potential side effects and administration steps. Patient will reach out to [place] via MyChart if she has any question.  Encourage patient to continue taking max tolerated statin along with this new medication. Follow up Labs are already scheduled early Jan.

## 2022-10-14 NOTE — Telephone Encounter (Signed)
Discuss with Dr. Johney Frame she would like patient to start [place]. PA for Repatha has been submitted (Key: [id number])

## 2022-11-28 ENCOUNTER — Ambulatory Visit: Payer: 59 | Attending: Cardiology

## 2022-11-28 DIAGNOSIS — E782 Mixed hyperlipidemia: Secondary | ICD-10-CM

## 2022-11-29 LAB — HEPATIC FUNCTION PANEL
ALT: 33 IU/L — ABNORMAL HIGH (ref 0–32)
AST: 26 IU/L (ref 0–40)
Albumin: 4.7 g/dL (ref 3.9–4.9)
Alkaline Phosphatase: 41 IU/L — ABNORMAL LOW (ref 44–121)
Bilirubin Total: 0.3 mg/dL (ref 0.0–1.2)
Bilirubin, Direct: 0.12 mg/dL (ref 0.00–0.40)
Total Protein: 6.6 g/dL (ref 6.0–8.5)

## 2022-11-29 LAB — NMR, LIPOPROFILE
Cholesterol, Total: 182 mg/dL (ref 100–199)
HDL Particle Number: 47.8 umol/L (ref >=30.5)
HDL-C: 83 mg/dL (ref >39)
LDL Particle Number: 957 nmol/L (ref <1000)
LDL Size: 21.4 nm (ref >20.5)
LDL-C (NIH Calc): 86 mg/dL (ref 0–99)
LP-IR Score: <25 (ref <=45)
Small LDL Particle Number: 264 nmol/L (ref <=527)
Triglycerides: 67 mg/dL (ref 0–149)

## 2022-11-29 LAB — APOLIPOPROTEIN B: Apolipoprotein B: 88 mg/dL (ref <90)

## 2022-11-29 LAB — HEMOGLOBIN A1C
Est. average glucose Bld gHb Est-mCnc: 123 mg/dL
Hgb A1c MFr Bld: 5.9 % — ABNORMAL HIGH (ref 4.8–5.6)

## 2022-12-01 ENCOUNTER — Encounter: Payer: Self-pay | Admitting: Cardiology

## 2022-12-01 ENCOUNTER — Encounter: Payer: Self-pay | Admitting: Internal Medicine

## 2022-12-02 ENCOUNTER — Telehealth: Payer: Self-pay | Admitting: Pharmacist

## 2022-12-02 NOTE — Telephone Encounter (Signed)
Significant improvement in LDL-C, LDL P, APO B, triglycerides, insulin resistance score.  Patient is currently taking Repatha every 2 weeks and ezetimibe daily.  She is off of rosuvastatin and feels significantly better.  States she would not take rosuvastatin even at a lower dose.  Technically LDL-C goal is less than 70 however patient has seen a greater than 50% reduction and she is only given 3 Repatha injections, would expect LDL-C to drop slightly more.  Her non-HDL cholesterol is 96 which is at goal of less than 100, this is a better risk predictor than LDL-C anyway.  Reviewed results with patient, she is back to her normal diet after the holidays, and is very pleased with the results.  Continue Repatha and ezetimibe, follow-up with Dr. Johney Frame in April.

## 2022-12-02 NOTE — Addendum Note (Signed)
Addended by: Marcelle Overlie D on: 12/02/2022 11:49 AM   Modules accepted: Orders

## 2022-12-02 NOTE — Telephone Encounter (Signed)
Ramond Dial, RPH-CPP 12/02/2022 11:48 AM EST Back to Top    Significant improvement in LDL-C, LDL P, APO B, triglycerides, insulin resistance score.  Patient is currently taking Repatha every 2 weeks and ezetimibe daily.  She is off of rosuvastatin and feels significantly better.  States she would not take rosuvastatin even at a lower dose.  Technically LDL-C goal is less than 70 however patient has seen a greater than 50% reduction and she is only given 3 Repatha injections, would expect LDL-C to drop slightly more.  Her non-HDL cholesterol is 96 which is at goal of less than 100, this is a better risk predictor than LDL-C anyway.  Reviewed results with patient, she is back to her normal diet after the holidays, and is very pleased with the results.  Continue Repatha and ezetimibe, follow-up with Dr. Johney Frame in April.

## 2023-01-10 NOTE — Progress Notes (Shared)
[facility] Note  01/13/2023     CHIEF COMPLAINT Patient presents for Retina Evaluation   HISTORY OF PRESENT ILLNESS: Jeanne Parker is a 62 y.o. female who presents to the clinic today for:   HPI     Retina Evaluation   In right eye.  This started 5 days ago.  Duration of 5 days.  I, the attending physician,  performed the HPI with the patient and updated documentation appropriately.        Comments   Retina eval per Dr Satira Sark for macular edema OD pt is reporting no vision change noticed she denies any flashes or floaters       Last edited by Bernarda Caffey, MD on 01/13/2023  6:57 PM.    Pt is here on the referral of Dr. Satira Sark for concern of mac edema OD, pt saw him last week and was told she has fluid in her right eye, pt has no visual symptoms, no blind spot, pt denies fol   Referring physician: Marygrace Drought, MD [address] [place],  [place] [address]  HISTORICAL INFORMATION:   Selected notes from the MEDICAL RECORD NUMBER Referred by Dr. Wyatt Portela for concern of iritis, operculated hole LEE: 05.11.20 (S. [place])   Ocular Hx- PMH-anxiety, breast cancer, hypothyroid   CURRENT MEDICATIONS: Current Outpatient Medications (Ophthalmic Drugs)  Medication Sig   cycloSPORINE (RESTASIS) 0.05 % ophthalmic emulsion Place into both eyes 2 (two) times daily.    timolol (TIMOPTIC) 0.5 % ophthalmic solution 1 drop 2 (two) times daily.   No current facility-administered medications for this visit. (Ophthalmic Drugs)   Current Outpatient Medications (Other)  Medication Sig   citalopram (CELEXA) 10 MG tablet Take 1 tablet (10 mg total) by mouth daily.   eszopiclone (LUNESTA) 2 MG TABS tablet TAKE 1 TABLET BY MOUTH AT  BEDTIME AS NEEDED FOR SLEEP IMMEDIATELY BEFORE BEDTIME.   Evolocumab (REPATHA) 140 MG/ML SOSY Inject 140 mg into the skin every 14 (fourteen) days.   ezetimibe (ZETIA) 10 MG tablet Take 1 tablet (10 mg total) by mouth daily.    levothyroxine (SYNTHROID) 75 MCG tablet TAKE 1 TABLET BY MOUTH  DAILY BEFORE BREAKFAST   Multiple Vitamin (MULTIVITAMIN) capsule Take 1 capsule by mouth daily.   tretinoin (RETIN-A) 0.025 % cream Apply topically at bedtime.   No current facility-administered medications for this visit. (Other)   REVIEW OF SYSTEMS: ROS   Positive for: Endocrine, Eyes, Psychiatric Negative for: Constitutional, Gastrointestinal, Neurological, Skin, Genitourinary, Musculoskeletal, HENT, Cardiovascular, Respiratory, Allergic/Imm, Heme/Lymph Last edited by Roselee Nova D, COT on 01/13/2023  8:36 AM.     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   Cataract    OU   History of chicken pox    History of colon polyps    Hypothyroid    Personal history of radiation therapy    Past Surgical History:  Procedure Laterality Date   BREAST BIOPSY     BREAST EXCISIONAL BIOPSY Left 1998   BREAST LUMPECTOMY Right 2016   BREAST SURGERY  1998   fibroid adenoma    RADIOACTIVE SEED GUIDED PARTIAL MASTECTOMY WITH AXILLARY SENTINEL LYMPH NODE BIOPSY Right 11/16/2015   Procedure: RADIOACTIVE SEED GUIDED PARTIAL MASTECTOMY WITH AXILLARY SENTINEL LYMPH NODE BIOPSY;  Surgeon: Erroll Luna, MD;  Location: [facility];  Service: General;  Laterality: Right;   FAMILY HISTORY Family History  Problem Relation Age of Onset   Hypertension Mother 6   Hyperlipidemia Mother    Thyroid nodules Mother    Diabetes Father 9   Hyperlipidemia Father    Hypertension Father    GER disease Father    Coronary artery disease Father        angioplasty age 74s   Atrial fibrillation Father        on coumadin   Thyroid cancer Sister    Colon cancer Paternal Grandfather        died age 43   Breast cancer Cousin    Gout Brother    SOCIAL HISTORY Social History    Tobacco Use   Smoking status: Never   Smokeless tobacco: Never  Vaping Use   Vaping Use: Never used  Substance Use Topics   Alcohol use: Yes    Comment: 1 to 2 glasses of wine a week    Drug use: No       Base Eye Exam     Visual Acuity (Snellen - Linear)       Right Left   Dist [place] 20/25    Dist cc  20/150   Dist ph [place] NI 20/40    Correction: Contacts         Tonometry (Tonopen, 7:54 AM)       Right Left   Pressure 13 15         Pupils       Pupils Dark Light Shape React APD   Right PERRL 5 4 Round Brisk None   Left PERRL 5 4 Round Brisk None         Visual Fields       Left Right    Full Full         Extraocular Movement       Right Left    Full, Ortho Full, Ortho         Neuro/Psych     Oriented x3: Yes   Mood/Affect: Normal         Dilation     Both eyes: 2.5% Phenylephrine @ 7:54 AM           Slit Lamp and Fundus Exam     Slit Lamp Exam       Right Left   Lids/Lashes Normal Normal   Conjunctiva/Sclera White and quiet White and quiet   Cornea mild arcus, well healed cataract wound, trace PEE mild Arcus   Anterior Chamber Deep and quiet deep and clear   Iris Round and well dilated Round and well dilated   Lens PC IOL in good position 2-3+ Nuclear sclerosis, 2+ Cortical cataract   Anterior Vitreous Vitreous syneresis, Posterior vitreous detachment, vitreous condensations mild syneresis         Fundus Exam       Right Left   Disc Pink and Sharp, mild tilt, Peripapillary atrophy, Compact Tilted disc, temporal Peripapillary atrophy, Pink and Sharp, Compact   C/D Ratio 0.4 0.6   Macula Flat, blunted foveal reflex, trace ERM, mild Retinal pigment epithelial mottling, shallow cystic changes / retinoschisis nasal macula Flat, blunted foveal reflex, mild Retinal pigment epithelial mottling, No heme or edema   Vessels attenuated, mild tortuosity mild attenuation, mild tortuosity   Periphery Attached, superior lattice w/  atrophic holes at 1030 and 1200 w/+SRF -- good laser surrounding; 3 focal patches of pigmented lattice with RPE  atrophy from 0600 to 0700, no new RT/RD/lattice Attached, pigmented lattice at 0100 and from 0430-0600; atrophic hole w/ mild cuff of SRF at 0500 -- good laser surrounding all lesions, No new RT/RD/lattice           Refraction     Wearing Rx       Sphere   Right -   Left            IMAGING AND PROCEDURES  Imaging and Procedures for @TODAY$ @  OCT, Retina - OU - Both Eyes       Right Eye Quality was good. Central Foveal Thickness: 252. Progression has worsened. Findings include normal foveal contour, no IRF, no SRF, myopic contour (Peripapillary cystic changes nasal macula and inferior to disc -- recurrent).   Left Eye Quality was good. Central Foveal Thickness: 259. Progression has been stable. Findings include normal foveal contour, no IRF, no SRF, myopic contour (Partial PVD).   Notes *Images captured and stored on drive  Diagnosis / Impression:  OD: Peripapillary cystic changes nasal macula and inferior to disc -- recurrent OS: NFP, no IRF/SRF, partial PVD  Clinical management:  See below  Abbreviations: NFP - Normal foveal profile. CME - cystoid macular edema. PED - pigment epithelial detachment. IRF - intraretinal fluid. SRF - subretinal fluid. EZ - ellipsoid zone. ERM - epiretinal membrane. ORA - outer retinal atrophy. ORT - outer retinal tubulation. SRHM - subretinal hyper-reflective material      Fluorescein Angiography Optos (Transit OD)       Right Eye Progression has no prior data. Early phase findings include delayed filling, staining. Mid/Late phase findings include staining (Staining of peripheral laser, no peripapillary leakge / hyper fluorescence).   Left Eye Progression has no prior data. Early phase findings include normal observations. Mid/Late phase findings include normal observations.   Notes *Images captured and stored on  drive  Diagnosis / Impression:  OD: Staining of peripheral laser, no peripapillary leakage or hyper fluorescence OS: normal study  Clinical management:  See below  Abbreviations: NFP - Normal foveal profile. CME - cystoid macular edema. PED - pigment epithelial detachment. IRF - intraretinal fluid. SRF - subretinal fluid. EZ - ellipsoid zone. ERM - epiretinal membrane. ORA - outer retinal atrophy. ORT - outer retinal tubulation. SRHM - subretinal hyper-reflective material. 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 Retinal hole of both eyes  H33.323     4. Left retinal detachment  H33.22 OCT, Retina - OU - Both Eyes    5. 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 Pseudophakia  Z96.1     7. Myopia of both eyes  H52.13     8. Hypertensive retinopathy of both eyes  H35.033       1. Retinal edema, myopic retinoschisis OD  - re-referred by Dr. Satira Sark for macular edema OD  - OCT shows nasal IRF/cystic macular changes extending from inf temp disc -- +intraretinal stranding -- recurrent  - FA (05.18.20) without hyperfluorescence/leakage suggesting not CME or optic pit -- myopic retinoschisis   - repeat (02.19.24) shows OD: Staining of peripheral laser, no peripapillary leakage / hyper fluorescence  - no retinal  or ophthalmic interventions indicated or recommended  - f/u 4-6 months, sooner prn - DFE, OCT  2-4. Lattice degeneration w/ atrophic holes, OU; OS with focal SRF/RD  - OD: atrophic hole at 1030, mild lattice with small hole at 1200  - OS: pigmented lattice at 0100 and 0430-0500--atrophic hole and +SRF at 0500  - s/p laser retinopexy OS (05.11.20), fill-in (06.05.20) -- good laser changes surrounding all lesions  - s/p laser  retinopexy OD (05.18.20), fill-in (06.05.20) -- good laser changes surrounding all lesions   - no new RT/RD  - f/u 1 year, DFE, OCT  5. Mixed form age related cataract OS - The symptoms of cataract, surgical options, and treatments and risks were discussed with patient. - discussed diagnosis and progression - under the expert management of Dr. Satira Sark - monitor  6. Pseudophakia OD  - s/p CE/IOL D (Dr. Satira Sark, Feb 2023)  - IOL in good position, doing well  - monitor  7. Myopia OU  - discussed association of myopia with lattice degeneration and RT/RD and retinoschisis  - monitor  Ophthalmic Meds Ordered this visit:  No orders of the defined types were placed in this encounter.    This document serves as a record of services personally performed by Gardiner Sleeper, MD, PhD. It was created on their behalf by Roselee Nova, COMT. The creation of this record is the provider's dictation and/or activities during the visit.  Electronically signed by: Roselee Nova, COMT 01/13/23 7:08 PM  This document serves as a record of services personally performed by Gardiner Sleeper, MD, PhD. It was created on their behalf by San Jetty. Owens Shark, OA an ophthalmic technician. The creation of this record is the provider's dictation and/or activities during the visit.    Electronically signed by: San Jetty. Owens Shark, [place] 02.19.2024 7:08 PM  Gardiner Sleeper, M.D., Ph.D. Diseases & Surgery of the Retina and Vitreous [facility]  I have reviewed the above documentation for accuracy and completeness, and I agree with the above. Gardiner Sleeper, M.D., Ph.D. 01/13/23 7:08 PM  Abbreviations: M myopia (nearsighted); A astigmatism; H hyperopia (farsighted); P presbyopia; Mrx spectacle prescription;  CTL contact lenses; OD right eye; OS left eye; OU both eyes  XT exotropia; ET esotropia; PEK punctate epithelial keratitis; PEE punctate epithelial erosions; DES dry eye syndrome; MGD meibomian gland  dysfunction; ATs artificial tears; PFAT's preservative free artificial tears; [place] nuclear sclerotic cataract; PSC posterior subcapsular cataract; ERM epi-retinal membrane; PVD posterior vitreous detachment; RD retinal detachment; DM diabetes mellitus; DR diabetic retinopathy; NPDR non-proliferative diabetic retinopathy; PDR proliferative diabetic retinopathy; CSME clinically significant macular edema; DME diabetic macular edema; dbh dot blot hemorrhages; CWS cotton wool spot; POAG primary open angle glaucoma; C/D cup-to-disc ratio; HVF humphrey visual field; GVF goldmann visual field; OCT optical coherence tomography; IOP intraocular pressure; BRVO Branch retinal vein occlusion; CRVO central retinal vein occlusion; CRAO central retinal artery occlusion; BRAO branch retinal artery occlusion; RT retinal tear; SB scleral buckle; PPV pars plana vitrectomy; VH Vitreous hemorrhage; PRP panretinal laser photocoagulation; IVK intravitreal kenalog; VMT vitreomacular traction; MH Macular hole;  NVD neovascularization of the disc; NVE neovascularization elsewhere; AREDS age related eye disease study; ARMD age related macular degeneration; POAG primary open angle glaucoma; EBMD epithelial/anterior basement membrane dystrophy; ACIOL anterior chamber intraocular lens; IOL intraocular lens; PCIOL posterior chamber intraocular lens; Phaco/IOL phacoemulsification with intraocular lens placement; [place] photorefractive keratectomy; LASIK laser assisted in situ keratomileusis; HTN hypertension; DM diabetes mellitus; COPD chronic obstructive  pulmonary disease

## 2023-01-13 ENCOUNTER — Encounter (INDEPENDENT_AMBULATORY_CARE_PROVIDER_SITE_OTHER): Payer: Self-pay | Admitting: Ophthalmology

## 2023-01-13 ENCOUNTER — Ambulatory Visit (INDEPENDENT_AMBULATORY_CARE_PROVIDER_SITE_OTHER): Payer: 59 | Admitting: Ophthalmology

## 2023-01-13 DIAGNOSIS — H35033 Hypertensive retinopathy, bilateral: Secondary | ICD-10-CM | POA: Diagnosis not present

## 2023-01-13 DIAGNOSIS — I10 Essential (primary) hypertension: Secondary | ICD-10-CM

## 2023-01-13 DIAGNOSIS — H33101 Unspecified retinoschisis, right eye: Secondary | ICD-10-CM | POA: Diagnosis not present

## 2023-01-13 DIAGNOSIS — H35413 Lattice degeneration of retina, bilateral: Secondary | ICD-10-CM

## 2023-01-13 DIAGNOSIS — H25812 Combined forms of age-related cataract, left eye: Secondary | ICD-10-CM

## 2023-01-13 DIAGNOSIS — H3322 Serous retinal detachment, left eye: Secondary | ICD-10-CM | POA: Diagnosis not present

## 2023-01-13 DIAGNOSIS — H33323 Round hole, bilateral: Secondary | ICD-10-CM | POA: Diagnosis not present

## 2023-01-13 DIAGNOSIS — H25813 Combined forms of age-related cataract, bilateral: Secondary | ICD-10-CM

## 2023-01-13 DIAGNOSIS — H5213 Myopia, bilateral: Secondary | ICD-10-CM

## 2023-01-13 DIAGNOSIS — Z961 Presence of intraocular lens: Secondary | ICD-10-CM

## 2023-01-25 ENCOUNTER — Other Ambulatory Visit: Payer: Self-pay | Admitting: Internal Medicine

## 2023-02-19 ENCOUNTER — Other Ambulatory Visit: Payer: Self-pay | Admitting: *Deleted

## 2023-02-19 MED ORDER — EZETIMIBE 10 MG PO TABS: 10.0000 mg | ORAL_TABLET | Freq: Every day | ORAL | 3 refills | Status: DC

## 2023-03-05 ENCOUNTER — Other Ambulatory Visit: Payer: Self-pay | Admitting: Internal Medicine

## 2023-03-05 DIAGNOSIS — Z0000 Encounter for general adult medical examination without abnormal findings: Secondary | ICD-10-CM

## 2023-03-12 ENCOUNTER — Ambulatory Visit
Admission: RE | Admit: 2023-03-12 | Discharge: 2023-03-12 | Disposition: A | Discharge status: Home / Self Care | Payer: 59 | Source: Ambulatory Visit | Attending: Internal Medicine | Admitting: Internal Medicine

## 2023-03-12 DIAGNOSIS — Z0000 Encounter for general adult medical examination without abnormal findings: Secondary | ICD-10-CM

## 2023-03-27 ENCOUNTER — Other Ambulatory Visit: Payer: Self-pay | Admitting: Internal Medicine

## 2023-04-07 ENCOUNTER — Encounter (INDEPENDENT_AMBULATORY_CARE_PROVIDER_SITE_OTHER): Payer: 59 | Admitting: Ophthalmology

## 2023-04-09 ENCOUNTER — Encounter: Payer: Self-pay | Admitting: Internal Medicine

## 2023-04-09 NOTE — Patient Instructions (Addendum)
If your symptoms worsen or fail to improve, please contact our office for further instruction, or in case of emergency go directly to the emergency room at the closest medical facility.   General Recommendations:    Please drink plenty of fluids.  Get plenty of rest   Sleep in humidified air  Use saline nasal sprays  Netti pot  OTC Medications:  Decongestants - helps relieve congestion   Flonase (generic fluticasone) or Nasacort (generic triamcinolone) - please make sure to use the "cross-over" technique at a 45 degree angle towards the opposite eye as opposed to straight up the nasal passageway.   Sudafed (generic pseudoephedrine - Note this is the one that is available behind the pharmacy counter); Products with phenylephrine (-PE) may also be used but is often not as effective as pseudoephedrine.   If you have HIGH BLOOD PRESSURE - Coricidin HBP; AVOID any product that is -D as this contains pseudoephedrine which may increase your blood pressure.  Afrin (oxymetazoline) every 6-8 hours for up to 3 days.  Allergies - helps relieve runny nose, itchy eyes and sneezing   Claritin (generic loratidine), Allegra (fexofenidine), or Zyrtec (generic cyrterizine) for runny nose. These medications should not cause drowsiness.  Note - Benadryl (generic diphenhydramine) may be used however may cause drowsiness  Cough -   Delsym or Robitussin (generic dextromethorphan)  Expectorants - helps loosen mucus to ease removal   Mucinex (generic guaifenesin) as directed on the package.  Headaches / General Aches   Tylenol (generic acetaminophen) - DO NOT EXCEED 3 grams (3,000 mg) in a 24 hour time period  Advil/Motrin (generic ibuprofen)  Sore Throat -   Salt water gargle   Chloraseptic (generic benzocaine) spray or lozenges / Sucrets (generic dyclonine)  

## 2023-04-09 NOTE — Progress Notes (Unsigned)
    Subjective:    Patient ID: Jeanne Parker, female    DOB: 23-Apr-1961, 62 y.o.   MRN: [id number]      HPI Ariene is here for No chief complaint on file.   She is here for an acute visit for cold symptoms.   Her symptoms started   She is experiencing   She has tried taking       Medications and allergies reviewed with patient and updated if appropriate.  Current Outpatient Medications on File Prior to Visit  Medication Sig Dispense Refill   citalopram (CELEXA) 10 MG tablet Take 1 tablet (10 mg total) by mouth daily. 30 tablet 3   cycloSPORINE (RESTASIS) 0.05 % ophthalmic emulsion Place into both eyes 2 (two) times daily.      eszopiclone (LUNESTA) 2 MG TABS tablet TAKE 1 TABLET BY MOUTH AT  BEDTIME AS NEEDED FOR SLEEP  IMMEDIATELY BEFORE BEDTIME 90 tablet 0   Evolocumab (REPATHA) 140 MG/ML SOSY Inject 140 mg into the skin every 14 (fourteen) days. 6 mL 3   ezetimibe (ZETIA) 10 MG tablet Take 1 tablet (10 mg total) by mouth daily. 90 tablet 3   levothyroxine (SYNTHROID) 75 MCG tablet TAKE 1 TABLET BY MOUTH DAILY  BEFORE BREAKFAST 90 tablet 3   Multiple Vitamin (MULTIVITAMIN) capsule Take 1 capsule by mouth daily.     timolol (TIMOPTIC) 0.5 % ophthalmic solution 1 drop 2 (two) times daily.     tretinoin (RETIN-A) 0.025 % cream Apply topically at bedtime. 45 g 0   No current facility-administered medications on file prior to visit.    Review of Systems     Objective:  There were no vitals filed for this visit. BP Readings from Last 3 Encounters:  08/27/22 120/70  04/29/22 110/78  11/23/21 122/78   Wt Readings from Last 3 Encounters:  08/27/22 134 lb (60.8 kg)  04/29/22 133 lb (60.3 kg)  11/23/21 134 lb 3 oz (60.9 kg)   There is no height or weight on file to calculate BMI.    Physical Exam         Assessment & Plan:    See Problem List for Assessment and Plan of chronic medical problems.

## 2023-04-10 ENCOUNTER — Ambulatory Visit: Payer: 59 | Admitting: Internal Medicine

## 2023-04-10 VITALS — BP 104/80 | HR 79 | Temp 97.9°F | Ht 65.0 in | Wt 132.0 lb

## 2023-04-10 DIAGNOSIS — R7303 Prediabetes: Secondary | ICD-10-CM

## 2023-04-10 DIAGNOSIS — J069 Acute upper respiratory infection, unspecified: Secondary | ICD-10-CM | POA: Diagnosis not present

## 2023-04-10 DIAGNOSIS — E039 Hypothyroidism, unspecified: Secondary | ICD-10-CM

## 2023-04-10 DIAGNOSIS — E782 Mixed hyperlipidemia: Secondary | ICD-10-CM

## 2023-04-10 NOTE — Assessment & Plan Note (Signed)
Acute Symptoms likely viral in nature Covid, flu tests negative Continue symptomatic treatment with over-the-counter cold medications, Tylenol/ibuprofen Increase rest and fluids Call if symptoms worsen or do not improve

## 2023-04-15 ENCOUNTER — Other Ambulatory Visit (INDEPENDENT_AMBULATORY_CARE_PROVIDER_SITE_OTHER): Payer: 59

## 2023-04-15 DIAGNOSIS — R7303 Prediabetes: Secondary | ICD-10-CM | POA: Diagnosis not present

## 2023-04-15 DIAGNOSIS — E782 Mixed hyperlipidemia: Secondary | ICD-10-CM | POA: Diagnosis not present

## 2023-04-15 DIAGNOSIS — E039 Hypothyroidism, unspecified: Secondary | ICD-10-CM

## 2023-04-15 LAB — COMPREHENSIVE METABOLIC PANEL
ALT: 24 U/L (ref 0–35)
AST: 21 U/L (ref 0–37)
Albumin: 4.3 g/dL (ref 3.5–5.2)
Alkaline Phosphatase: 33 U/L — ABNORMAL LOW (ref 39–117)
BUN: 13 mg/dL (ref 6–23)
CO2: 29 mEq/L (ref 19–32)
Calcium: 9.3 mg/dL (ref 8.4–10.5)
Chloride: 101 mEq/L (ref 96–112)
Creatinine, Ser: 0.7 mg/dL (ref 0.40–1.20)
GFR: 92.65 mL/min (ref >60.00)
Glucose, Bld: 97 mg/dL (ref 70–99)
Potassium: 4 mEq/L (ref 3.5–5.1)
Sodium: 137 mEq/L (ref 135–145)
Total Bilirubin: 0.5 mg/dL (ref 0.2–1.2)
Total Protein: 6.8 g/dL (ref 6.0–8.3)

## 2023-04-15 LAB — CBC WITH DIFFERENTIAL/PLATELET
Basophils Absolute: 0 10*3/uL (ref 0.0–0.1)
Basophils Relative: 0.9 % (ref 0.0–3.0)
Eosinophils Absolute: 0.2 10*3/uL (ref 0.0–0.7)
Eosinophils Relative: 6.9 % — ABNORMAL HIGH (ref 0.0–5.0)
HCT: 38.1 % (ref 36.0–46.0)
Hemoglobin: 12.7 g/dL (ref 12.0–15.0)
Lymphocytes Relative: 36.5 % (ref 12.0–46.0)
Lymphs Abs: 1 10*3/uL (ref 0.7–4.0)
MCHC: 33.4 g/dL (ref 30.0–36.0)
MCV: 93 fl (ref 78.0–100.0)
Monocytes Absolute: 0.3 10*3/uL (ref 0.1–1.0)
Monocytes Relative: 11.6 % (ref 3.0–12.0)
Neutro Abs: 1.3 10*3/uL — ABNORMAL LOW (ref 1.4–7.7)
Neutrophils Relative %: 44.1 % (ref 43.0–77.0)
Platelets: 283 10*3/uL (ref 150.0–400.0)
RBC: 4.1 Mil/uL (ref 3.87–5.11)
RDW: 13.7 % (ref 11.5–15.5)
WBC: 2.9 10*3/uL — ABNORMAL LOW (ref 4.0–10.5)

## 2023-04-15 LAB — LIPID PANEL
Cholesterol: 150 mg/dL (ref 0–200)
HDL: 66.1 mg/dL (ref >39.00)
LDL Cholesterol: 70 mg/dL (ref 0–99)
NonHDL: 83.65
Total CHOL/HDL Ratio: 2
Triglycerides: 69 mg/dL (ref 0.0–149.0)
VLDL: 13.8 mg/dL (ref 0.0–40.0)

## 2023-04-15 LAB — HEMOGLOBIN A1C: Hgb A1c MFr Bld: 5.9 % (ref 4.6–6.5)

## 2023-04-15 LAB — TSH: TSH: 1.33 u[IU]/mL (ref 0.35–5.50)

## 2023-05-05 ENCOUNTER — Encounter: Payer: Self-pay | Admitting: Internal Medicine

## 2023-05-05 NOTE — Progress Notes (Signed)
[facility] - Clinic Note  05/12/2023     CHIEF COMPLAINT Patient presents for Retina Follow Up   HISTORY OF PRESENT ILLNESS: Jeanne Parker is a 62 y.o. female who presents to the clinic today for:   HPI     Retina Follow Up   In both eyes.  This started 4 months ago.  Duration of 4 months.  Since onset it is stable.  I, the attending physician,  performed the HPI with the patient and updated documentation appropriately.        Comments   4 month retina follow up myopic ret schisis OD pt is reporting no vision changes noticed she denies any flashes or floaters       Last edited by Rennis Chris, MD on 05/12/2023 11:05 PM.    Pt states vision is stable   Referring physician: Pincus Sanes, MD [address] [place],  [place] [address]  HISTORICAL INFORMATION:   Selected notes from the MEDICAL RECORD NUMBER Referred by Dr. Fabian Sharp for concern of iritis, operculated hole LEE: 05.11.20 (S. Groat)   Ocular Hx- PMH-anxiety, breast cancer, hypothyroid   CURRENT MEDICATIONS: Current Outpatient Medications (Ophthalmic Drugs)  Medication Sig   cycloSPORINE (RESTASIS) 0.05 % ophthalmic emulsion Place into both eyes 2 (two) times daily.    timolol (TIMOPTIC) 0.5 % ophthalmic solution 1 drop 2 (two) times daily.   No current facility-administered medications for this visit. (Ophthalmic Drugs)   Current Outpatient Medications (Other)  Medication Sig   citalopram (CELEXA) 10 MG tablet Take 1 tablet (10 mg total) by mouth daily.   Coenzyme Q10 (COQ-10 PO) Take by mouth.   eszopiclone (LUNESTA) 2 MG TABS tablet TAKE 1 TABLET BY MOUTH AT  BEDTIME AS NEEDED FOR SLEEP  IMMEDIATELY BEFORE BEDTIME   Evolocumab (REPATHA) 140 MG/ML SOSY Inject 140 mg into the skin every 14 (fourteen) days.   ezetimibe (ZETIA) 10 MG tablet Take 1 tablet (10 mg total) by mouth daily.   levothyroxine (SYNTHROID) 75 MCG tablet TAKE 1 TABLET BY MOUTH DAILY  BEFORE BREAKFAST    Multiple Vitamin (MULTIVITAMIN) capsule Take 1 capsule by mouth daily.   tretinoin (RETIN-A) 0.025 % cream Apply topically at bedtime.   No current facility-administered medications for this visit. (Other)   REVIEW OF SYSTEMS: ROS   Positive for: Endocrine, Eyes, Psychiatric Negative for: Constitutional, Gastrointestinal, Neurological, Skin, Genitourinary, Musculoskeletal, HENT, Cardiovascular, Respiratory, Allergic/Imm, Heme/Lymph Last edited by Etheleen Mayhew, COT on 05/12/2023  3:02 PM.      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   Cataract    OU   History of chicken pox    History of colon polyps    Hypothyroid    Personal history of radiation therapy    Past Surgical History:  Procedure Laterality Date   BREAST BIOPSY     BREAST EXCISIONAL BIOPSY Left 1998   BREAST LUMPECTOMY Right 2016   BREAST SURGERY  1998   fibroid adenoma    RADIOACTIVE SEED GUIDED PARTIAL MASTECTOMY WITH AXILLARY SENTINEL LYMPH NODE BIOPSY Right 11/16/2015   Procedure: RADIOACTIVE SEED GUIDED PARTIAL MASTECTOMY WITH AXILLARY SENTINEL LYMPH NODE BIOPSY;  Surgeon: Harriette Bouillon, MD;  Location: [facility];  Service: General;  Laterality: Right;   FAMILY HISTORY Family History  Problem  Relation Age of Onset   Hypertension Mother 38   Hyperlipidemia Mother    Thyroid nodules Mother    Diabetes Father 80   Hyperlipidemia Father    Hypertension Father    GER disease Father    Coronary artery disease Father        angioplasty age 62s   Atrial fibrillation Father        on coumadin   Thyroid cancer Sister    Colon cancer Paternal Grandfather        died age 87   Breast cancer Cousin    Gout Brother    SOCIAL HISTORY Social History   Tobacco Use   Smoking status: Never   Smokeless tobacco: Never  Vaping Use    Vaping Use: Never used  Substance Use Topics   Alcohol use: Yes    Comment: 1 to 2 glasses of wine a week    Drug use: No       Base Eye Exam     Visual Acuity (Snellen - Linear)       Right Left   Dist [place] 20/25    Dist cc  20/150   Dist ph cc  20/40 -1    Correction: Contacts         Tonometry (Tonopen, 3:09 PM)       Right Left   Pressure 12 13         Pupils       Pupils Dark Light Shape React APD   Right PERRL 5 4 Round Brisk None   Left PERRL 5 4 Round Brisk None         Visual Fields       Left Right    Full Full         Extraocular Movement       Right Left    Full, Ortho Full, Ortho         Neuro/Psych     Oriented x3: Yes   Mood/Affect: Normal         Dilation     Both eyes: 2.5% Phenylephrine @ 3:09 PM           Slit Lamp and Fundus Exam     Slit Lamp Exam       Right Left   Lids/Lashes Normal Normal   Conjunctiva/Sclera White and quiet White and quiet   Cornea mild arcus, well healed cataract wound, 2+PEE mild Arcus, 1+ Punctate epithelial erosions   Anterior Chamber deep and clear deep and clear   Iris Round and well dilated Round and well dilated   Lens PC IOL in good position 2-3+ Nuclear sclerosis, 2-3+ Cortical cataract   Anterior Vitreous Vitreous syneresis, Posterior vitreous detachment, vitreous condensations mild syneresis         Fundus Exam       Right Left   Disc Pink and Sharp, mild tilt, Peripapillary atrophy, Compact Tilted disc, temporal Peripapillary atrophy, Pink and Sharp, Compact   C/D Ratio 0.4 0.6   Macula Flat, blunted foveal reflex, trace ERM, mild Retinal pigment epithelial mottling, shallow cystic changes / retinoschisis nasal macula -- slightly improved Flat, blunted foveal reflex, mild Retinal pigment epithelial mottling, No heme or edema   Vessels attenuated, mild tortuosity mild attenuation, mild tortuosity   Periphery Attached, superior lattice w/ atrophic holes at 1030 and 1200  w/+SRF -- good laser surrounding; 3 focal patches of pigmented lattice with RPE atrophy from 0600 to 0700, no new RT/RD/lattice Attached, pigmented  lattice at 0100 and from 0430-0600; atrophic hole w/ mild cuff of SRF at 0500 -- good laser surrounding all lesions, No new RT/RD/lattice           Refraction     Wearing Rx       Sphere   Right -   Left            IMAGING AND PROCEDURES  Imaging and Procedures for @TODAY @  OCT, Retina - OU - Both Eyes       Right Eye Quality was good. Central Foveal Thickness: 252. Progression has improved. Findings include normal foveal contour, no IRF, no SRF, myopic contour (Peripapillary cystic changes nasal macula and inferior to disc -- improved).   Left Eye Quality was good. Central Foveal Thickness: 262. Progression has been stable. Findings include normal foveal contour, no IRF, no SRF, myopic contour (Partial PVD).   Notes *Images captured and stored on drive  Diagnosis / Impression:  OD: Peripapillary cystic changes nasal macula and inferior to disc -- improved OS: NFP, no IRF/SRF, partial PVD  Clinical management:  See below  Abbreviations: NFP - Normal foveal profile. CME - cystoid macular edema. PED - pigment epithelial detachment. IRF - intraretinal fluid. SRF - subretinal fluid. EZ - ellipsoid zone. ERM - epiretinal membrane. ORA - outer retinal atrophy. ORT - outer retinal tubulation. 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 Retinal hole of both eyes  H33.323     4. Left retinal detachment  H33.22     5. 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 Pseudophakia  Z96.1     7. Myopia of both eyes  H52.13      1. Retinal edema, myopic retinoschisis OD  - OCT shows nasal IRF/cystic macular changes extending from inf  temp disc -- slightly improved  - FA (05.18.20) without hyperfluorescence/leakage suggesting not CME or optic pit -- myopic retinoschisis   - repeat (02.19.24) shows OD: Staining of peripheral laser, no peripapillary leakage / hyper fluorescence  - no retinal or ophthalmic interventions indicated or recommended  - f/u 6 months, sooner prn - DFE, OCT  2-4. Lattice degeneration w/ atrophic holes, OU; OS with focal SRF/RD  - OD: atrophic hole at 1030, mild lattice with small hole at 1200  - OS: pigmented lattice at 0100 and 0430-0500--atrophic hole and +SRF at 0500  - s/p laser retinopexy OS (05.11.20), fill-in (06.05.20) -- good laser changes surrounding all lesions  - s/p laser retinopexy OD (05.18.20), fill-in (06.05.20) -- good laser changes surrounding all lesions   - no new RT/RD  - monitor  5. Mixed form age related cataract OS - The symptoms of cataract, surgical options, and treatments and risks were discussed with patient. - discussed diagnosis and progression - under the expert management of Dr. Burgess Estelle - monitor  6. Pseudophakia OD  - s/p CE/IOL D (Dr. Burgess Estelle, Feb 2023)  - IOL in good position, doing well  - monitor  7. Myopia OU  - discussed association of myopia with lattice degeneration and RT/RD and retinoschisis  - monitor  Ophthalmic Meds Ordered this visit:  No orders of the defined types were placed in this encounter.    This document serves  as a record of services personally performed by Karie Chimera, MD, PhD. It was created on their behalf by De Blanch, an ophthalmic technician. The creation of this record is the provider's dictation and/or activities during the visit.    Electronically signed by: De Blanch, OA, 05/12/23  11:06 PM  This document serves as a record of services personally performed by Karie Chimera, MD, PhD. It was created on their behalf by Glee Arvin. Manson Passey, OA an ophthalmic technician. The creation of this record is the  provider's dictation and/or activities during the visit.    Electronically signed by: Glee Arvin. Kristopher Oppenheim 06.17.2024 11:06 PM  Karie Chimera, M.D., Ph.D. Diseases & Surgery of the Retina and Vitreous [facility]  I have reviewed the above documentation for accuracy and completeness, and I agree with the above. Karie Chimera, M.D., Ph.D. 05/12/23 11:08 PM   Abbreviations: M myopia (nearsighted); A astigmatism; H hyperopia (farsighted); P presbyopia; Mrx spectacle prescription;  CTL contact lenses; OD right eye; OS left eye; OU both eyes  XT exotropia; ET esotropia; PEK punctate epithelial keratitis; PEE punctate epithelial erosions; DES dry eye syndrome; MGD meibomian gland dysfunction; ATs artificial tears; PFAT's preservative free artificial tears; NSC nuclear sclerotic cataract; PSC posterior subcapsular cataract; ERM epi-retinal membrane; PVD posterior vitreous detachment; RD retinal detachment; DM diabetes mellitus; DR diabetic retinopathy; NPDR non-proliferative diabetic retinopathy; PDR proliferative diabetic retinopathy; CSME clinically significant macular edema; DME diabetic macular edema; dbh dot blot hemorrhages; CWS cotton wool spot; POAG primary open angle glaucoma; C/D cup-to-disc ratio; HVF humphrey visual field; GVF goldmann visual field; OCT optical coherence tomography; IOP intraocular pressure; BRVO Branch retinal vein occlusion; CRVO central retinal vein occlusion; CRAO central retinal artery occlusion; BRAO branch retinal artery occlusion; RT retinal tear; SB scleral buckle; PPV pars plana vitrectomy; VH Vitreous hemorrhage; PRP panretinal laser photocoagulation; IVK intravitreal kenalog; VMT vitreomacular traction; MH Macular hole;  NVD neovascularization of the disc; NVE neovascularization elsewhere; AREDS age related eye disease study; ARMD age related macular degeneration; POAG primary open angle glaucoma; EBMD epithelial/anterior basement membrane  dystrophy; ACIOL anterior chamber intraocular lens; IOL intraocular lens; PCIOL posterior chamber intraocular lens; Phaco/IOL phacoemulsification with intraocular lens placement; PRK photorefractive keratectomy; LASIK laser assisted in situ keratomileusis; HTN hypertension; DM diabetes mellitus; COPD chronic obstructive pulmonary disease

## 2023-05-05 NOTE — Progress Notes (Signed)
Subjective:    Patient ID: Jeanne Parker, female    DOB: 1960-12-03, 62 y.o.   MRN: [id number]      HPI Mccall is here for a Physical exam and her chronic medical problems.    Doing well-no concerns.  Medications and allergies reviewed with patient and updated if appropriate.  Current Outpatient Medications on File Prior to Visit  Medication Sig Dispense Refill   citalopram (CELEXA) 10 MG tablet Take 1 tablet (10 mg total) by mouth daily. 30 tablet 3   Coenzyme Q10 (COQ-10 PO) Take by mouth.     cycloSPORINE (RESTASIS) 0.05 % ophthalmic emulsion Place into both eyes 2 (two) times daily.      eszopiclone (LUNESTA) 2 MG TABS tablet TAKE 1 TABLET BY MOUTH AT  BEDTIME AS NEEDED FOR SLEEP  IMMEDIATELY BEFORE BEDTIME 90 tablet 0   Evolocumab (REPATHA) 140 MG/ML SOSY Inject 140 mg into the skin every 14 (fourteen) days. 6 mL 3   ezetimibe (ZETIA) 10 MG tablet Take 1 tablet (10 mg total) by mouth daily. 90 tablet 3   levothyroxine (SYNTHROID) 75 MCG tablet TAKE 1 TABLET BY MOUTH DAILY  BEFORE BREAKFAST 90 tablet 3   Multiple Vitamin (MULTIVITAMIN) capsule Take 1 capsule by mouth daily.     timolol (TIMOPTIC) 0.5 % ophthalmic solution 1 drop 2 (two) times daily.     tretinoin (RETIN-A) 0.025 % cream Apply topically at bedtime. 45 g 0   No current facility-administered medications on file prior to visit.    Review of Systems     Objective:  There were no vitals filed for this visit. There were no vitals filed for this visit. There is no height or weight on file to calculate BMI.  BP Readings from Last 3 Encounters:  04/10/23 104/80  08/27/22 120/70  04/29/22 110/78    Wt Readings from Last 3 Encounters:  04/10/23 132 lb (59.9 kg)  08/27/22 134 lb (60.8 kg)  04/29/22 133 lb (60.3 kg)       Physical Exam Constitutional: She appears well-developed and well-nourished. No distress.  HENT:  Head: Normocephalic and atraumatic.  Right Ear: External ear normal. Normal ear  canal and TM Left Ear: External ear normal.  Normal ear canal and TM Mouth/Throat: Oropharynx is clear and moist.  Eyes: Conjunctivae normal.  Neck: Neck supple. No tracheal deviation present. No thyromegaly present.  No carotid bruit  Cardiovascular: Normal rate, regular rhythm and normal heart sounds.   No murmur heard.  No edema. Pulmonary/Chest: Effort normal and breath sounds normal. No respiratory distress. She has no wheezes. She has no rales.  Breast: deferred   Abdominal: Soft. She exhibits no distension. There is no tenderness.  Lymphadenopathy: She has no cervical adenopathy.  Skin: Skin is warm and dry. She is not diaphoretic.  Psychiatric: She has a normal mood and affect. Her behavior is normal.     Lab Results  Component Value Date   WBC 2.9 (L) 04/15/2023   HGB 12.7 04/15/2023   HCT 38.1 04/15/2023   PLT 283.0 04/15/2023   GLUCOSE 97 04/15/2023   CHOL 150 04/15/2023   TRIG 69.0 04/15/2023   HDL 66.10 04/15/2023   LDLDIRECT 158.7 01/04/2013   LDLCALC 70 04/15/2023   ALT 24 04/15/2023   AST 21 04/15/2023   NA 137 04/15/2023   K 4.0 04/15/2023   CL 101 04/15/2023   CREATININE 0.70 04/15/2023   BUN 13 04/15/2023   CO2 29 04/15/2023   TSH  1.33 04/15/2023   HGBA1C 5.9 04/15/2023         Assessment & Plan:   Physical exam: Screening blood work  ordered - reviewed Exercise regular Weight normal Substance abuse  none   Reviewed recommended immunizations.   Health Maintenance  Topic Date Due   COVID-19 Vaccine (5 - 2023-24 season) 07/26/2022   INFLUENZA VACCINE  06/26/2023   DEXA SCAN  10/13/2023   PAP SMEAR-Modifier  03/29/2024   DTaP/Tdap/Td (2 - Td or Tdap) 06/29/2024   MAMMOGRAM  03/11/2025   Colonoscopy  09/18/2025   Hepatitis C Screening  Completed   HIV Screening  Completed   Zoster Vaccines- Shingrix  Completed   HPV VACCINES  Aged Out          See Problem List for Assessment and Plan of chronic medical problems.    This  encounter was created in error - please disregard.

## 2023-05-05 NOTE — Patient Instructions (Addendum)
    Blood work was ordered.   The lab is on the first floor.    Medications changes include :   none     Return in about 1 year (around 05/05/2024) for Physical Exam.   Health Maintenance, Female Adopting a healthy lifestyle and getting preventive care are important in promoting health and wellness. Ask your health care provider about: The right schedule for you to have regular tests and exams. Things you can do on your own to prevent diseases and keep yourself healthy. What should I know about diet, weight, and exercise? Eat a healthy diet  Eat a diet that includes plenty of vegetables, fruits, low-fat dairy products, and lean protein. Do not eat a lot of foods that are high in solid fats, added sugars, or sodium. Maintain a healthy weight Body mass index (BMI) is used to identify weight problems. It estimates body fat based on height and weight. Your health care provider can help determine your BMI and help you achieve or maintain a healthy weight. Get regular exercise Get regular exercise. This is one of the most important things you can do for your health. Most adults should: Exercise for at least 150 minutes each week. The exercise should increase your heart rate and make you sweat (moderate-intensity exercise). Do strengthening exercises at least twice a week. This is in addition to the moderate-intensity exercise. Spend less time sitting. Even light physical activity can be beneficial. Watch cholesterol and blood lipids Have your blood tested for lipids and cholesterol at 63 years of age, then have this test every 5 years. Have your cholesterol levels checked more often if: Your lipid or cholesterol levels are high. You are older than 63 years of age. You are at high risk for heart disease. What should I know about cancer screening? Depending on your health history and family history, you may need to have cancer screening at various ages. This may include screening  for: Breast cancer. Cervical cancer. Colorectal cancer. Skin cancer. Lung cancer. What should I know about heart disease, diabetes, and high blood pressure? Blood pressure and heart disease High blood pressure causes heart disease and increases the risk of stroke. This is more likely to develop in people who have high blood pressure readings or are overweight. Have your blood pressure checked: Every 3-5 years if you are 18-39 years of age. Every year if you are 40 years old or older. Diabetes Have regular diabetes screenings. This checks your fasting blood sugar level. Have the screening done: Once every three years after age 40 if you are at a normal weight and have a low risk for diabetes. More often and at a younger age if you are overweight or have a high risk for diabetes. What should I know about preventing infection? Hepatitis B If you have a higher risk for hepatitis B, you should be screened for this virus. Talk with your health care provider to find out if you are at risk for hepatitis B infection. Hepatitis C Testing is recommended for: Everyone born from 1945 through 1965. Anyone with known risk factors for hepatitis C. Sexually transmitted infections (STIs) Get screened for STIs, including gonorrhea and chlamydia, if: You are sexually active and are younger than 63 years of age. You are older than 63 years of age and your health care provider tells you that you are at risk for this type of infection. Your sexual activity has changed since you were last screened, and you are at   increased risk for chlamydia or gonorrhea. Ask your health care provider if you are at risk. Ask your health care provider about whether you are at high risk for HIV. Your health care provider may recommend a prescription medicine to help prevent HIV infection. If you choose to take medicine to prevent HIV, you should first get tested for HIV. You should then be tested every 3 months for as long as you  are taking the medicine. Pregnancy If you are about to stop having your period (premenopausal) and you may become pregnant, seek counseling before you get pregnant. Take 400 to 800 micrograms (mcg) of folic acid every day if you become pregnant. Ask for birth control (contraception) if you want to prevent pregnancy. Osteoporosis and menopause Osteoporosis is a disease in which the bones lose minerals and strength with aging. This can result in bone fractures. If you are 65 years old or older, or if you are at risk for osteoporosis and fractures, ask your health care provider if you should: Be screened for bone loss. Take a calcium or vitamin D supplement to lower your risk of fractures. Be given hormone replacement therapy (HRT) to treat symptoms of menopause. Follow these instructions at home: Alcohol use Do not drink alcohol if: Your health care provider tells you not to drink. You are pregnant, may be pregnant, or are planning to become pregnant. If you drink alcohol: Limit how much you have to: 0-1 drink a day. Know how much alcohol is in your drink. In the [place] one drink equals one 12 oz bottle of beer (355 mL), one 5 oz glass of wine (148 mL), or one 1 oz glass of hard liquor (44 mL). Lifestyle Do not use any products that contain nicotine or tobacco. These products include cigarettes, chewing tobacco, and vaping devices, such as e-cigarettes. If you need help quitting, ask your health care provider. Do not use street drugs. Do not share needles. Ask your health care provider for help if you need support or information about quitting drugs. General instructions Schedule regular health, dental, and eye exams. Stay current with your vaccines. Tell your health care provider if: You often feel depressed. You have ever been abused or do not feel safe at home. Summary Adopting a healthy lifestyle and getting preventive care are important in promoting health and wellness. Follow your  health care provider's instructions about healthy diet, exercising, and getting tested or screened for diseases. Follow your health care provider's instructions on monitoring your cholesterol and blood pressure. This information is not intended to replace advice given to you by your health care provider. Make sure you discuss any questions you have with your health care provider. Document Revised: 04/02/2021 Document Reviewed: 04/02/2021 Elsevier Patient Education  2024 Elsevier Inc.  

## 2023-05-06 ENCOUNTER — Telehealth: Payer: Self-pay | Admitting: Internal Medicine

## 2023-05-06 ENCOUNTER — Encounter: Payer: 59 | Admitting: Internal Medicine

## 2023-05-06 DIAGNOSIS — E039 Hypothyroidism, unspecified: Secondary | ICD-10-CM

## 2023-05-06 DIAGNOSIS — F411 Generalized anxiety disorder: Secondary | ICD-10-CM

## 2023-05-06 DIAGNOSIS — R7303 Prediabetes: Secondary | ICD-10-CM

## 2023-05-06 DIAGNOSIS — Z0000 Encounter for general adult medical examination without abnormal findings: Secondary | ICD-10-CM

## 2023-05-06 DIAGNOSIS — E782 Mixed hyperlipidemia: Secondary | ICD-10-CM

## 2023-05-06 DIAGNOSIS — E559 Vitamin D deficiency, unspecified: Secondary | ICD-10-CM

## 2023-05-06 DIAGNOSIS — G479 Sleep disorder, unspecified: Secondary | ICD-10-CM

## 2023-05-06 MED ORDER — NIRMATRELVIR/RITONAVIR (PAXLOVID)TABLET: 3.0000 | ORAL_TABLET | Freq: Two times a day (BID) | ORAL | 0 refills | Status: AC

## 2023-05-06 NOTE — Assessment & Plan Note (Signed)
Chronic  Clinically euthyroid Currently taking levothyroxine 75 mcg TSH in normal range Continue current dose  

## 2023-05-06 NOTE — Telephone Encounter (Signed)
We should have probably done a virtual visit this morning and set up a physical.  I will prescribe it.  Prescription sent to [facility].

## 2023-05-06 NOTE — Telephone Encounter (Signed)
Left message for patient and also sent her a my-chart message.

## 2023-05-06 NOTE — Assessment & Plan Note (Signed)
Chronic Continue vitamin D supplementation 

## 2023-05-06 NOTE — Assessment & Plan Note (Signed)
Chronic Lab Results  Component Value Date   HGBA1C 5.9 04/15/2023   Sugars stable in prediabetic range Low sugar/carb diet Regular exercise

## 2023-05-06 NOTE — Assessment & Plan Note (Signed)
Chronic °Controlled, stable °Continue celexa 20 mg daily ° °

## 2023-05-06 NOTE — Telephone Encounter (Signed)
Patient called to inform Dr. Lawerance Bach that she tested positive for Covid 05/05/2023. She was unable to come to her appointment today 05/06/2023 because of that. She would like to know if Dr. Lawerance Bach can prescribe paxlovid for her. She also rescheduled her physical for 05/16/2023. Best callback is [phone].

## 2023-05-06 NOTE — Assessment & Plan Note (Signed)
Chronic Regular exercise and healthy diet encouraged LDL well controlled Coronary calcium score 0 in 2020 ASCVD risk low Continue zetia

## 2023-05-06 NOTE — Assessment & Plan Note (Signed)
Chronic Controlled, stable Continue lunesta 2 mg HSprn

## 2023-05-12 ENCOUNTER — Ambulatory Visit (INDEPENDENT_AMBULATORY_CARE_PROVIDER_SITE_OTHER): Payer: 59 | Admitting: Ophthalmology

## 2023-05-12 ENCOUNTER — Encounter (INDEPENDENT_AMBULATORY_CARE_PROVIDER_SITE_OTHER): Payer: Self-pay | Admitting: Ophthalmology

## 2023-05-12 DIAGNOSIS — H5213 Myopia, bilateral: Secondary | ICD-10-CM

## 2023-05-12 DIAGNOSIS — Z961 Presence of intraocular lens: Secondary | ICD-10-CM

## 2023-05-12 DIAGNOSIS — H33323 Round hole, bilateral: Secondary | ICD-10-CM

## 2023-05-12 DIAGNOSIS — H33101 Unspecified retinoschisis, right eye: Secondary | ICD-10-CM

## 2023-05-12 DIAGNOSIS — H25812 Combined forms of age-related cataract, left eye: Secondary | ICD-10-CM | POA: Diagnosis not present

## 2023-05-12 DIAGNOSIS — H3322 Serous retinal detachment, left eye: Secondary | ICD-10-CM | POA: Diagnosis not present

## 2023-05-12 DIAGNOSIS — H35413 Lattice degeneration of retina, bilateral: Secondary | ICD-10-CM

## 2023-05-15 NOTE — Patient Instructions (Addendum)
Medications changes include :   none     Return in about 1 year (around 05/15/2024) for Physical Exam.    Health Maintenance, Female Adopting a healthy lifestyle and getting preventive care are important in promoting health and wellness. Ask your health care provider about: The right schedule for you to have regular tests and exams. Things you can do on your own to prevent diseases and keep yourself healthy. What should I know about diet, weight, and exercise? Eat a healthy diet  Eat a diet that includes plenty of vegetables, fruits, low-fat dairy products, and lean protein. Do not eat a lot of foods that are high in solid fats, added sugars, or sodium. Maintain a healthy weight Body mass index (BMI) is used to identify weight problems. It estimates body fat based on height and weight. Your health care provider can help determine your BMI and help you achieve or maintain a healthy weight. Get regular exercise Get regular exercise. This is one of the most important things you can do for your health. Most adults should: Exercise for at least 150 minutes each week. The exercise should increase your heart rate and make you sweat (moderate-intensity exercise). Do strengthening exercises at least twice a week. This is in addition to the moderate-intensity exercise. Spend less time sitting. Even light physical activity can be beneficial. Watch cholesterol and blood lipids Have your blood tested for lipids and cholesterol at 63 years of age, then have this test every 5 years. Have your cholesterol levels checked more often if: Your lipid or cholesterol levels are high. You are older than 63 years of age. You are at high risk for heart disease. What should I know about cancer screening? Depending on your health history and family history, you may need to have cancer screening at various ages. This may include screening for: Breast cancer. Cervical cancer. Colorectal cancer. Skin  cancer. Lung cancer. What should I know about heart disease, diabetes, and high blood pressure? Blood pressure and heart disease High blood pressure causes heart disease and increases the risk of stroke. This is more likely to develop in people who have high blood pressure readings or are overweight. Have your blood pressure checked: Every 3-5 years if you are 51-5 years of age. Every year if you are 79 years old or older. Diabetes Have regular diabetes screenings. This checks your fasting blood sugar level. Have the screening done: Once every three years after age 36 if you are at a normal weight and have a low risk for diabetes. More often and at a younger age if you are overweight or have a high risk for diabetes. What should I know about preventing infection? Hepatitis B If you have a higher risk for hepatitis B, you should be screened for this virus. Talk with your health care provider to find out if you are at risk for hepatitis B infection. Hepatitis C Testing is recommended for: Everyone born from [birth year] through 1966. Anyone with known risk factors for hepatitis C. Sexually transmitted infections (STIs) Get screened for STIs, including gonorrhea and chlamydia, if: You are sexually active and are younger than 63 years of age. You are older than 63 years of age and your health care provider tells you that you are at risk for this type of infection. Your sexual activity has changed since you were last screened, and you are at increased risk for chlamydia or gonorrhea. Ask your health care provider if you are  at risk. Ask your health care provider about whether you are at high risk for HIV. Your health care provider may recommend a prescription medicine to help prevent HIV infection. If you choose to take medicine to prevent HIV, you should first get tested for HIV. You should then be tested every 3 months for as long as you are taking the medicine. Pregnancy If you are about to stop  having your period (premenopausal) and you may become pregnant, seek counseling before you get pregnant. Take 400 to 800 micrograms (mcg) of folic acid every day if you become pregnant. Ask for birth control (contraception) if you want to prevent pregnancy. Osteoporosis and menopause Osteoporosis is a disease in which the bones lose minerals and strength with aging. This can result in bone fractures. If you are 56 years old or older, or if you are at risk for osteoporosis and fractures, ask your health care provider if you should: Be screened for bone loss. Take a calcium or vitamin D supplement to lower your risk of fractures. Be given hormone replacement therapy (HRT) to treat symptoms of menopause. Follow these instructions at home: Alcohol use Do not drink alcohol if: Your health care provider tells you not to drink. You are pregnant, may be pregnant, or are planning to become pregnant. If you drink alcohol: Limit how much you have to: 0-1 drink a day. Know how much alcohol is in your drink. In the [place] one drink equals one 12 oz bottle of beer (355 mL), one 5 oz glass of wine (148 mL), or one 1 oz glass of hard liquor (44 mL). Lifestyle Do not use any products that contain nicotine or tobacco. These products include cigarettes, chewing tobacco, and vaping devices, such as e-cigarettes. If you need help quitting, ask your health care provider. Do not use street drugs. Do not share needles. Ask your health care provider for help if you need support or information about quitting drugs. General instructions Schedule regular health, dental, and eye exams. Stay current with your vaccines. Tell your health care provider if: You often feel depressed. You have ever been abused or do not feel safe at home. Summary Adopting a healthy lifestyle and getting preventive care are important in promoting health and wellness. Follow your health care provider's instructions about healthy diet,  exercising, and getting tested or screened for diseases. Follow your health care provider's instructions on monitoring your cholesterol and blood pressure. This information is not intended to replace advice given to you by your health care provider. Make sure you discuss any questions you have with your health care provider. Document Revised: 04/02/2021 Document Reviewed: 04/02/2021 Elsevier Patient Education  2024 [employer].

## 2023-05-15 NOTE — Progress Notes (Signed)
Subjective:    Patient ID: Jeanne Parker, female    DOB: [birth year], 62 y.o.   MRN: [id number]      HPI Jeanne Parker is here for a Physical exam and her chronic medical problems.    Doing well-no concerns.  Had some allergy or mild sinus symptoms x 2 days - taking allergy medication.    Medications and allergies reviewed with patient and updated if appropriate.  Current Outpatient Medications on File Prior to Visit  Medication Sig Dispense Refill   Coenzyme Q10 (COQ-10 PO) Take by mouth.     cycloSPORINE (RESTASIS) 0.05 % ophthalmic emulsion Place into both eyes 2 (two) times daily.      eszopiclone (LUNESTA) 2 MG TABS tablet TAKE 1 TABLET BY MOUTH AT  BEDTIME AS NEEDED FOR SLEEP  IMMEDIATELY BEFORE BEDTIME 90 tablet 0   Evolocumab (REPATHA) 140 MG/ML SOSY Inject 140 mg into the skin every 14 (fourteen) days. 6 mL 3   ezetimibe (ZETIA) 10 MG tablet Take 1 tablet (10 mg total) by mouth daily. 90 tablet 3   levothyroxine (SYNTHROID) 75 MCG tablet TAKE 1 TABLET BY MOUTH DAILY  BEFORE BREAKFAST 90 tablet 3   Multiple Vitamin (MULTIVITAMIN) capsule Take 1 capsule by mouth daily.     timolol (TIMOPTIC) 0.5 % ophthalmic solution 1 drop 2 (two) times daily.     tretinoin (RETIN-A) 0.025 % cream Apply topically at bedtime. 45 g 0   No current facility-administered medications on file prior to visit.    Review of Systems  Constitutional:  Negative for fever.  Eyes:  Negative for visual disturbance.  Respiratory:  Negative for cough, shortness of breath and wheezing.   Cardiovascular:  Negative for chest pain, palpitations and leg swelling.  Gastrointestinal:  Negative for abdominal pain, blood in stool, constipation and diarrhea.       Occ gerd  Genitourinary:  Negative for dysuria.  Musculoskeletal:  Negative for arthralgias and back pain.  Skin:  Negative for rash.  Neurological:  Negative for light-headedness and headaches.  Psychiatric/Behavioral:  Negative for dysphoric mood.  The patient is not nervous/anxious.        Objective:   Vitals:   05/16/23 1511  BP: 112/72  Pulse: 73  Temp: 98 F (36.7 C)  SpO2: 93%   Filed Weights   05/16/23 1511  Weight: 133 lb (60.3 kg)   Body mass index is 22.13 kg/m.  BP Readings from Last 3 Encounters:  05/16/23 112/72  04/10/23 104/80  08/27/22 120/70    Wt Readings from Last 3 Encounters:  05/16/23 133 lb (60.3 kg)  04/10/23 132 lb (59.9 kg)  08/27/22 134 lb (60.8 kg)       Physical Exam Constitutional: She appears well-developed and well-nourished. No distress.  HENT:  Head: Normocephalic and atraumatic.  Right Ear: External ear normal. Normal ear canal and TM Left Ear: External ear normal.  Normal ear canal and TM Mouth/Throat: Oropharynx is clear and moist.  Eyes: Conjunctivae normal.  Neck: Neck supple. No tracheal deviation present. No thyromegaly present.  No carotid bruit  Cardiovascular: Normal rate, regular rhythm and normal heart sounds.   No murmur heard.  No edema. Pulmonary/Chest: Effort normal and breath sounds normal. No respiratory distress. She has no wheezes. She has no rales.  Breast: deferred   Abdominal: Soft. She exhibits no distension. There is no tenderness.  Lymphadenopathy: She has no cervical adenopathy.  Skin: Skin is warm and dry. She is not diaphoretic.  Psychiatric:  She has a normal mood and affect. Her behavior is normal.     Lab Results  Component Value Date   WBC 2.9 (L) 04/15/2023   HGB 12.7 04/15/2023   HCT 38.1 04/15/2023   PLT 283.0 04/15/2023   GLUCOSE 97 04/15/2023   CHOL 150 04/15/2023   TRIG 69.0 04/15/2023   HDL 66.10 04/15/2023   LDLDIRECT 158.7 01/04/2013   LDLCALC 70 04/15/2023   ALT 24 04/15/2023   AST 21 04/15/2023   NA 137 04/15/2023   K 4.0 04/15/2023   CL 101 04/15/2023   CREATININE 0.70 04/15/2023   BUN 13 04/15/2023   CO2 29 04/15/2023   TSH 1.33 04/15/2023   HGBA1C 5.9 04/15/2023         Assessment & Plan:   Physical  exam: Screening blood work  ordered - reviewed Exercise   regular Weight   normal Substance abuse  none   Reviewed recommended immunizations.   Health Maintenance  Topic Date Due   COVID-19 Vaccine (5 - 2023-24 season) 06/01/2023 (Originally 07/26/2022)   INFLUENZA VACCINE  06/26/2023   DEXA SCAN  10/13/2023   PAP SMEAR-Modifier  03/29/2024   DTaP/Tdap/Td (2 - Td or Tdap) 06/29/2024   MAMMOGRAM  03/11/2025   Colonoscopy  09/18/2025   Hepatitis C Screening  Completed   HIV Screening  Completed   Zoster Vaccines- Shingrix  Completed   HPV VACCINES  Aged Out          See Problem List for Assessment and Plan of chronic medical problems.

## 2023-05-16 ENCOUNTER — Other Ambulatory Visit: Payer: Self-pay | Admitting: Internal Medicine

## 2023-05-16 ENCOUNTER — Encounter: Payer: Self-pay | Admitting: Internal Medicine

## 2023-05-16 ENCOUNTER — Ambulatory Visit (INDEPENDENT_AMBULATORY_CARE_PROVIDER_SITE_OTHER): Payer: 59 | Admitting: Internal Medicine

## 2023-05-16 VITALS — BP 112/72 | HR 73 | Temp 98.0°F | Ht 65.0 in | Wt 133.0 lb

## 2023-05-16 DIAGNOSIS — M85851 Other specified disorders of bone density and structure, right thigh: Secondary | ICD-10-CM | POA: Diagnosis not present

## 2023-05-16 DIAGNOSIS — E039 Hypothyroidism, unspecified: Secondary | ICD-10-CM

## 2023-05-16 DIAGNOSIS — E782 Mixed hyperlipidemia: Secondary | ICD-10-CM

## 2023-05-16 DIAGNOSIS — E559 Vitamin D deficiency, unspecified: Secondary | ICD-10-CM

## 2023-05-16 DIAGNOSIS — Z0000 Encounter for general adult medical examination without abnormal findings: Secondary | ICD-10-CM

## 2023-05-16 DIAGNOSIS — R7303 Prediabetes: Secondary | ICD-10-CM

## 2023-05-16 DIAGNOSIS — G479 Sleep disorder, unspecified: Secondary | ICD-10-CM

## 2023-05-16 DIAGNOSIS — F411 Generalized anxiety disorder: Secondary | ICD-10-CM

## 2023-05-16 MED ORDER — CITALOPRAM HYDROBROMIDE 10 MG PO TABS: 10.0000 mg | ORAL_TABLET | Freq: Every day | ORAL | 3 refills | Status: DC

## 2023-05-16 NOTE — Assessment & Plan Note (Signed)
Chronic  Clinically euthyroid Currently taking levothyroxine 75 mcg TSH in normal range Continue current dose  

## 2023-05-16 NOTE — Assessment & Plan Note (Addendum)
Chronic Lab Results  Component Value Date   HGBA1C 5.9 04/15/2023    Low sugar/carb diet Regular exercise

## 2023-05-16 NOTE — Assessment & Plan Note (Signed)
Chronic Controlled, stable Continue celexa 10 mg daily  

## 2023-05-16 NOTE — Assessment & Plan Note (Addendum)
Chronic Regular exercise and healthy diet encouraged LDL well controlled  -better Coronary calcium score 0 in 2020 ASCVD risk low Continue zetia 10 mg daily, repatha 140 ,g Q 14 days

## 2023-05-16 NOTE — Assessment & Plan Note (Signed)
Chronic dexa up to date Continue calcium and vitamin supplementation Continue regular exercise 

## 2023-05-16 NOTE — Assessment & Plan Note (Signed)
Chronic Controlled, stable Continue lunesta 2 mg HSprn  

## 2023-05-16 NOTE — Assessment & Plan Note (Signed)
Chronic Continue vitamin D supplementation 

## 2023-06-02 NOTE — Progress Notes (Signed)
Cardiology Office Note:    Date:  06/04/2023   ID:  [name], DOB 28-Feb-1961, MRN [id number]  PCP:  Jeanne Sanes, MD   [facility] Providers Cardiologist:  None {  Referring MD: Jeanne Sanes, MD    History of Present Illness:    Jeanne Parker is a 62 y.o. female with a hx of prediabetes, HLD, anxiety, and breast cancer who presents to clinic for follow-up.  Patient with history of Ca score 0 in 11/2018. Had NMR panel in 05/01/22 which showed LDL particle number 1896, LDL 195, HDL 76, TG 81 prompting referral to Cardiology for further evaluation.  Was last seen in 08/2022 where she was doing very well. Active without exertional symptoms. She was ultimately started on repatha due to not reaching goal on zetia/crestor.  Today, the patient overall feeling well. No chest pain, SOB, orthopnea or PND. Remains very active and feels well with activity. Blood pressure well controlled. Tolerating medications as prescribed.   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   Cataract    OU   History of chicken pox    History of colon polyps    Hypothyroid    Personal history of radiation therapy     Past Surgical History:  Procedure Laterality Date   BREAST BIOPSY     BREAST EXCISIONAL BIOPSY Left 1998   BREAST LUMPECTOMY Right 2016   BREAST SURGERY  1998   fibroid adenoma    RADIOACTIVE SEED GUIDED PARTIAL MASTECTOMY WITH AXILLARY SENTINEL LYMPH NODE BIOPSY Right 11/16/2015   Procedure: RADIOACTIVE SEED GUIDED PARTIAL MASTECTOMY WITH AXILLARY SENTINEL LYMPH NODE BIOPSY;  Surgeon: Jeanne Bouillon, MD;  Location: [facility];  Service: General;  Laterality: Right;    Current Medications: Current Meds  Medication Sig   citalopram (CELEXA) 10 MG tablet Take 1 tablet (10 mg total) by mouth daily.   Coenzyme Q10 (COQ-10 PO) Take by mouth.   cycloSPORINE  (RESTASIS) 0.05 % ophthalmic emulsion Place into both eyes 2 (two) times daily.    eszopiclone (LUNESTA) 2 MG TABS tablet TAKE 1 TABLET BY MOUTH AT  BEDTIME AS NEEDED FOR SLEEP  IMMEDIATELY BEFORE BEDTIME   Evolocumab (REPATHA) 140 MG/ML SOSY Inject 140 mg into the skin every 14 (fourteen) days.   ezetimibe (ZETIA) 10 MG tablet Take 1 tablet (10 mg total) by mouth daily.   levothyroxine (SYNTHROID) 75 MCG tablet TAKE 1 TABLET BY MOUTH DAILY  BEFORE BREAKFAST   Multiple Vitamin (MULTIVITAMIN) capsule Take 1 capsule by mouth daily.   timolol (TIMOPTIC) 0.5 % ophthalmic solution 1 drop 2 (two) times daily.   tretinoin (RETIN-A) 0.025 % cream Apply topically at bedtime.     Allergies:   Patient has no known allergies.   Social History   Socioeconomic History   Marital status: Married    Spouse name: Not on file   Number of children: 3   Years of education: Not on file   Highest education level: Not on file  Occupational History   Occupation: Warehouse manager    Comment: Futures trader  Tobacco Use   Smoking status: Never   Smokeless tobacco: Never  Vaping Use   Vaping Use: Never used  Substance and Sexual Activity   Alcohol use: Yes    Comment: 1 to 2 glasses of wine a week  Drug use: No   Sexual activity: Not on file  Other Topics Concern   Not on file  Social History Narrative   Not on file   Social Determinants of Health   Financial Resource Strain: Not on file  Food Insecurity: Not on file  Transportation Needs: Not on file  Physical Activity: Not on file  Stress: Not on file  Social Connections: Not on file     Family History: The patient's family history includes Atrial fibrillation in her father; Breast cancer in her cousin; Colon cancer in her paternal grandfather; Coronary artery disease in her father; Diabetes (age of onset: 49) in her father; GER disease in her father; Gout in her brother; Hyperlipidemia in her father and mother; Hypertension in her  father; Hypertension (age of onset: 78) in her mother; Thyroid cancer in her sister; Thyroid nodules in her mother.  ROS:   As per HPI   EKGs/Labs/Other Studies Reviewed:    The following studies were reviewed today:  12/18/18 Ca score: FINDINGS: Non-cardiac: See separate report from [facility].   Ascending aorta: Normal diameter 3.0 cm   Pericardium: Normal   Coronary arteries: No calcium noted   IMPRESSION: Coronary calcium score of 0.  FINDINGS: Vascular: Normal aortic caliber.   Mediastinum/Nodes: No imaged thoracic adenopathy.   Lungs/Pleura: No imaged pleural fluid. Clear imaged lungs.   Upper Abdomen: Normal imaged portions of the liver, spleen, stomach.   Musculoskeletal: No acute osseous abnormality.   IMPRESSION: No acute extracardiac findings in the imaged chest.  EKG:  No new tracing  Recent Labs: 04/15/2023: ALT 24; BUN 13; Creatinine, Ser 0.70; Hemoglobin 12.7; Platelets 283.0; Potassium 4.0; Sodium 137; TSH 1.33  Recent Lipid Panel    Component Value Date/Time   CHOL 150 04/15/2023 0801   TRIG 69.0 04/15/2023 0801   HDL 66.10 04/15/2023 0801   CHOLHDL 2 04/15/2023 0801   VLDL 13.8 04/15/2023 0801   LDLCALC 70 04/15/2023 0801   LDLDIRECT 158.7 01/04/2013 1054     Risk Assessment/Calculations:                Physical Exam:    VS:  BP 106/74   Pulse 70   Ht 5\' 5"  (1.651 m)   Wt 132 lb (59.9 kg)   LMP 08/31/2015 (Exact Date)   SpO2 97%   BMI 21.97 kg/m     Wt Readings from Last 3 Encounters:  06/04/23 132 lb (59.9 kg)  05/16/23 133 lb (60.3 kg)  04/10/23 132 lb (59.9 kg)     GEN: Well nourished, well developed in no acute distress HEENT: Normal NECK: No JVD; No carotid bruits CARDIAC: RRR, no murmurs, rubs, gallops RESPIRATORY:  Clear to auscultation without rales, wheezing or rhonchi  ABDOMEN: Soft, non-tender, non-distended MUSCULOSKELETAL:  No edema; No deformity  SKIN: Warm and dry NEUROLOGIC:  Alert and  oriented x 3 PSYCHIATRIC:  Normal affect   ASSESSMENT:    1. Mixed hyperlipidemia   2. Familial hyperlipidemia    PLAN:    In order of problems listed above:  #HLD: -Likely familial with initial LDL 195, Lp(a) 264 which improved to LDL 70 with treatment -Ca score 0 -Had muscle aches with crestor and was ultimately placed on repatha and zetia with marked improvement in lipids as detailed above -Will continue with current therapies and lifestyle modifications -Plan to follow-up with Dr. Cristal Deer going forward       Follow up in 6 months.  Medication Adjustments/Labs and Tests Ordered: Current  medicines are reviewed at length with the patient today.  Concerns regarding medicines are outlined above.  No orders of the defined types were placed in this encounter.  No orders of the defined types were placed in this encounter.   There are no Patient Instructions on file for this visit.    Signed, Meriam Sprague, MD  06/04/2023 9:19 AM    [facility]

## 2023-06-04 ENCOUNTER — Encounter: Payer: Self-pay | Admitting: Cardiology

## 2023-06-04 ENCOUNTER — Ambulatory Visit: Payer: 59 | Attending: Cardiology | Admitting: Cardiology

## 2023-06-04 VITALS — BP 106/74 | HR 70 | Ht 65.0 in | Wt 132.0 lb

## 2023-06-04 DIAGNOSIS — E782 Mixed hyperlipidemia: Secondary | ICD-10-CM | POA: Diagnosis not present

## 2023-06-04 DIAGNOSIS — E7849 Other hyperlipidemia: Secondary | ICD-10-CM

## 2023-06-04 NOTE — Patient Instructions (Addendum)
Medication Instructions:  Your physician recommends that you continue on your current medications as directed. Please refer to the Current Medication list given to you today.  *If you need a refill on your cardiac medications before your next appointment, please call your pharmacy*  Lab Work: None ordered today.  Testing/Procedures: None ordered today.  Follow-Up: At [facility], you and your health needs are our priority.  As part of our continuing mission to provide you with exceptional heart care, we have created designated Provider Care Teams.  These Care Teams include your primary Cardiologist (physician) and Advanced Practice Providers (APPs -  Physician Assistants and Nurse Practitioners) who all work together to provide you with the care you need, when you need it.  Your next appointment:   4-6 month(s)  The format for your next appointment:   In Person  Provider:   Jodelle Red, MD

## 2023-08-07 ENCOUNTER — Other Ambulatory Visit: Payer: Self-pay

## 2023-08-07 ENCOUNTER — Encounter: Payer: Self-pay | Admitting: Internal Medicine

## 2023-08-07 MED ORDER — CITALOPRAM HYDROBROMIDE 10 MG PO TABS: 10.0000 mg | ORAL_TABLET | Freq: Every day | ORAL | 3 refills | Status: DC

## 2023-08-12 ENCOUNTER — Telehealth: Payer: Self-pay | Admitting: Internal Medicine

## 2023-08-12 NOTE — Telephone Encounter (Signed)
A pharmacy tech from [facility] called to verify that a manufacturer transition to Lupin is okay for patient's levothyroxine. Best callback is [phone].  Reference number is [id number].

## 2023-08-13 NOTE — Telephone Encounter (Signed)
Spoke with Irving Burton today and ok given to change.

## 2023-09-18 ENCOUNTER — Ambulatory Visit: Payer: 59

## 2023-09-18 DIAGNOSIS — Z23 Encounter for immunization: Secondary | ICD-10-CM | POA: Diagnosis not present

## 2023-09-18 NOTE — Progress Notes (Signed)
Patient here for Flu shot. Patient tolerated well with no complications.

## 2023-09-22 ENCOUNTER — Other Ambulatory Visit: Payer: Self-pay | Admitting: Pharmacist

## 2023-09-22 MED ORDER — REPATHA SURECLICK 140 MG/ML ~~LOC~~ SOAJ: 140.0000 mg | SUBCUTANEOUS | 3 refills | Status: DC

## 2023-09-29 ENCOUNTER — Other Ambulatory Visit: Payer: Self-pay | Admitting: Pharmacist

## 2023-09-29 MED ORDER — REPATHA SURECLICK 140 MG/ML ~~LOC~~ SOAJ: 140.0000 mg | SUBCUTANEOUS | 3 refills | Status: DC

## 2023-10-10 ENCOUNTER — Encounter (HOSPITAL_BASED_OUTPATIENT_CLINIC_OR_DEPARTMENT_OTHER): Payer: Self-pay | Admitting: Cardiology

## 2023-10-10 ENCOUNTER — Ambulatory Visit (HOSPITAL_BASED_OUTPATIENT_CLINIC_OR_DEPARTMENT_OTHER): Payer: 59 | Admitting: Cardiology

## 2023-10-10 ENCOUNTER — Encounter (HOSPITAL_BASED_OUTPATIENT_CLINIC_OR_DEPARTMENT_OTHER): Payer: Self-pay | Admitting: *Deleted

## 2023-10-10 VITALS — BP 126/78 | HR 64 | Ht 65.0 in | Wt 135.6 lb

## 2023-10-10 DIAGNOSIS — T466X5D Adverse effect of antihyperlipidemic and antiarteriosclerotic drugs, subsequent encounter: Secondary | ICD-10-CM

## 2023-10-10 DIAGNOSIS — E7841 Elevated Lipoprotein(a): Secondary | ICD-10-CM | POA: Diagnosis not present

## 2023-10-10 DIAGNOSIS — E7800 Pure hypercholesterolemia, unspecified: Secondary | ICD-10-CM

## 2023-10-10 DIAGNOSIS — Z7189 Other specified counseling: Secondary | ICD-10-CM | POA: Diagnosis not present

## 2023-10-10 DIAGNOSIS — E7801 Familial hypercholesterolemia: Secondary | ICD-10-CM | POA: Insufficient documentation

## 2023-10-10 DIAGNOSIS — M791 Myalgia, unspecified site: Secondary | ICD-10-CM | POA: Insufficient documentation

## 2023-10-10 NOTE — Progress Notes (Signed)
  Cardiology Office Note:  .   Date:  10/10/2023  ID:  Jeanne Parker, DOB November 04, 1961, MRN [id number] PCP: Pincus Sanes, MD  [facility] Providers Cardiologist:  Jodelle Red, MD {  History of Present Illness: .   Jeanne Parker is a 62 y.o. female with PMH hypercholesterolemia consistent with HeFH, elevated lp(a). She was previously followed by Dr. Shari Prows and established care with me on 10/10/23.  Pertinent CV history: Ca score 0 in 11/2018. Had NMR panel in 05/01/22 which showed LDL particle number 1896, LDL 195, HDL 76, TG 81 prompting referral to Cardiology for further evaluation. 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  Today: Reviewed her history, treatment plan today. She is doing well on the repatha. Reviewed recommendations and guidelines. Discussed CoQ10 today. Reviewed lifestyle recommendations today. Excellent diet, stays very active. Hikes, does pilates.  ROS: Denies chest pain, shortness of breath at rest or with normal exertion. No PND, orthopnea, LE edema or unexpected weight gain. No syncope or palpitations. ROS otherwise negative except as noted.   Studies Reviewed: Marland Kitchen    EKG:  EKG Interpretation Date/Time:  Friday October 10 2023 09:12:39 EST Ventricular Rate:  57 PR Interval:  160 QRS Duration:  78 QT Interval:  404 QTC Calculation: 393 R Axis:   72  Text Interpretation: Sinus bradycardia Confirmed by Jodelle Red [phone]) on 10/10/2023 9:31:17 AM    Physical Exam:   VS:  BP 126/78   Pulse 64   Ht 5\' 5"  (1.651 m)   Wt 135 lb 9.6 oz (61.5 kg)   LMP 08/31/2015 (Exact Date)   SpO2 97%   BMI 22.57 kg/m    Wt Readings from Last 3 Encounters:  10/10/23 135 lb 9.6 oz (61.5 kg)  06/04/23 132 lb (59.9 kg)  05/16/23 133 lb (60.3 kg)    GEN: Well nourished, well developed in no acute distress HEENT: Normal, moist mucous membranes NECK: No JVD CARDIAC: regular rhythm, normal S1 and S2, no rubs or gallops. No murmur. VASCULAR: Radial  and DP pulses 2+ bilaterally. No carotid bruits RESPIRATORY:  Clear to auscultation without rales, wheezing or rhonchi  ABDOMEN: Soft, non-tender, non-distended MUSCULOSKELETAL:  Ambulates independently SKIN: Warm and dry, no edema NEUROLOGIC:  Alert and oriented x 3. No focal neuro deficits noted. PSYCHIATRIC:  Normal affect    ASSESSMENT AND PLAN: .    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 Discussed that I would not recheck as she is now on therapy. Discussed future of CT imaging for fatty plaque. -had myalgia with rosuvastatin, tolerated repatha and ezetimibe  CV risk counseling and prevention -recommend heart healthy/Mediterranean diet, with whole grains, fruits, vegetable, fish, lean meats, nuts, and olive oil. Limit salt. -recommend moderate walking, 3-5 times/week for 30-50 minutes each session. Aim for at least 150 minutes.week. Goal should be pace of 3 miles/hours, or walking 1.5 miles in 30 minutes -recommend avoidance of tobacco products. Avoid excess alcohol.  Dispo: 1 year or sooner as needed  Signed, Jodelle Red, MD   Jodelle Red, MD, PhD, [facility]  [facility]  [place]  [facility] at [facility] [address], [address] [place], [place] [address] [phone]

## 2023-10-10 NOTE — Patient Instructions (Signed)

## 2023-10-28 NOTE — Progress Notes (Signed)
[facility] - Clinic Note  11/11/2023     CHIEF COMPLAINT Patient presents for Retina Follow Up   HISTORY OF PRESENT ILLNESS: Jeanne Parker is a 63 y.o. female who presents to the clinic today for:   HPI     Retina Follow Up   Patient presents with  Other.  In right eye.  This started 6 months ago.  I, the attending physician,  performed the HPI with the patient and updated documentation appropriately.        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 Patient states vision good. No changes. Uses Timolol BID OU.       Last edited by Rennis Chris, MD on 11/11/2023  4:16 PM.    Pt states vision is stable   Referring physician: Pincus Sanes, MD [address] [place],  [place] [address]  HISTORICAL INFORMATION:   Selected notes from the MEDICAL RECORD NUMBER Referred by Dr. Fabian Sharp for concern of iritis, operculated hole LEE: 05.11.20 (S. Groat)   Ocular Hx- PMH-anxiety, breast cancer, hypothyroid   CURRENT MEDICATIONS: Current Outpatient Medications (Ophthalmic Drugs)  Medication Sig   cycloSPORINE (RESTASIS) 0.05 % ophthalmic emulsion Place into both eyes 2 (two) times daily.    timolol (TIMOPTIC) 0.5 % ophthalmic solution 1 drop 2 (two) times daily.   No current facility-administered medications for this visit. (Ophthalmic Drugs)   Current Outpatient Medications (Other)  Medication Sig   citalopram (CELEXA) 10 MG tablet Take 1 tablet (10 mg total) by mouth daily.   Coenzyme Q10 (COQ-10 PO) Take by mouth.   eszopiclone (LUNESTA) 2 MG TABS tablet TAKE 1 TABLET BY MOUTH AT  BEDTIME AS NEEDED FOR SLEEP  IMMEDIATELY BEFORE BEDTIME   Evolocumab (REPATHA SURECLICK) 140 MG/ML SOAJ Inject 140 mg into the skin every 14 (fourteen) days.   ezetimibe (ZETIA) 10 MG tablet Take 1 tablet (10 mg total) by mouth daily.   levothyroxine (SYNTHROID) 75 MCG tablet TAKE 1 TABLET BY MOUTH DAILY  BEFORE BREAKFAST   Multiple  Vitamin (MULTIVITAMIN) capsule Take 1 capsule by mouth daily.   tretinoin (RETIN-A) 0.025 % cream Apply topically at bedtime.   No current facility-administered medications for this visit. (Other)   REVIEW OF SYSTEMS: ROS   Positive for: Endocrine, Eyes, Psychiatric Negative for: Constitutional, Gastrointestinal, Neurological, Skin, Genitourinary, Musculoskeletal, HENT, Cardiovascular, Respiratory, Allergic/Imm, Heme/Lymph Last edited by Laddie Aquas, COA on 11/11/2023  2:26 PM.       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   Cataract    OU   History of chicken pox    History of colon polyps    Hypothyroid    Personal history of radiation therapy    Past Surgical History:  Procedure Laterality Date   BREAST BIOPSY     BREAST EXCISIONAL BIOPSY Left 1998   BREAST LUMPECTOMY Right 2016   BREAST SURGERY  1998   fibroid adenoma    RADIOACTIVE SEED GUIDED PARTIAL MASTECTOMY WITH AXILLARY SENTINEL LYMPH NODE BIOPSY Right 11/16/2015   Procedure: RADIOACTIVE SEED GUIDED PARTIAL MASTECTOMY WITH AXILLARY SENTINEL LYMPH NODE BIOPSY;  Surgeon: Harriette Bouillon, MD;  Location: [facility];  Service: General;  Laterality: Right;   FAMILY HISTORY Family History  Problem Relation Age of  Onset   Hypertension Mother 39   Hyperlipidemia Mother    Thyroid nodules Mother    Diabetes Father 80   Hyperlipidemia Father    Hypertension Father    GER disease Father    Coronary artery disease Father        angioplasty age 81s   Atrial fibrillation Father        on coumadin   Thyroid cancer Sister    Colon cancer Paternal Grandfather        died age 91   Breast cancer Cousin    Gout Brother    SOCIAL HISTORY Social History   Tobacco Use   Smoking status: Never   Smokeless tobacco: Never  Vaping Use   Vaping status:  Never Used  Substance Use Topics   Alcohol use: Yes    Comment: 1 to 2 glasses of wine a week    Drug use: No       Base Eye Exam     Visual Acuity (Snellen - Linear)       Right Left   Dist  20/20    Dist cc  20/80   Dist ph cc  20/20    Correction: Contacts  Wears CL OS only.        Tonometry (Tonopen, 2:23 PM)       Right Left   Pressure 16 14         Pupils       Dark Light Shape React APD   Right 5 4 Round Brisk None   Left 5 4 Round Brisk None         Visual Fields (Counting fingers)       Left Right    Full Full         Extraocular Movement       Right Left    Full, Ortho Full, Ortho         Neuro/Psych     Oriented x3: Yes   Mood/Affect: Normal         Dilation     Both eyes: 1.0% Mydriacyl, 2.5% Phenylephrine @ 2:23 PM           Slit Lamp and Fundus Exam     Slit Lamp Exam       Right Left   Lids/Lashes Normal Normal   Conjunctiva/Sclera White and quiet White and quiet   Cornea mild arcus, well healed cataract wound, trace PEE mild Arcus, trace Punctate epithelial erosions   Anterior Chamber deep and clear deep and clear   Iris Round and well dilated Round and well dilated   Lens PC IOL in good position 2+ Nuclear sclerosis, 2+ Cortical cataract   Anterior Vitreous Vitreous syneresis, Posterior vitreous detachment, vitreous condensations mild syneresis         Fundus Exam       Right Left   Disc Pink and Sharp, mild tilt, Peripapillary atrophy, Compact Tilted disc, temporal Peripapillary atrophy, Pink and Sharp, Compact   C/D Ratio 0.4 0.6   Macula Flat, blunted foveal reflex, trace ERM, mild Retinal pigment epithelial mottling, shallow cystic changes / retinoschisis nasal macula -- slightly improved Flat, blunted foveal reflex, mild Retinal pigment epithelial mottling, No heme or edema   Vessels attenuated, mild tortuosity mild attenuation, mild tortuosity   Periphery Attached, superior lattice w/ atrophic  holes at 1030 and 1200 w/+SRF -- good laser surrounding; 3 focal patches of pigmented lattice with RPE atrophy from 0600 to 0700, no new RT/RD/lattice  Attached, pigmented lattice at 0100 and from 0430-0600; atrophic hole w/ mild cuff of SRF at 0500 -- good laser surrounding all lesions, No new RT/RD/lattice           Refraction     Wearing Rx       Sphere   Right -   Left            IMAGING AND PROCEDURES  Imaging and Procedures for @TODAY @  OCT, Retina - OU - Both Eyes       Right Eye Quality was good. Central Foveal Thickness: 254. Progression has improved. Findings include normal foveal contour, no SRF, myopic contour, intraretinal fluid (Peripapillary cystic changes nasal macula and inferior to disc -- improved).   Left Eye Quality was good. Central Foveal Thickness: 262. Progression has been stable. Findings include normal foveal contour, no IRF, no SRF, myopic contour (Partial PVD).   Notes *Images captured and stored on drive  Diagnosis / Impression:  OD: Peripapillary cystic changes nasal macula and inferior to disc -- improved OS: NFP, no IRF/SRF, partial PVD  Clinical management:  See below  Abbreviations: NFP - Normal foveal profile. CME - cystoid macular edema. PED - pigment epithelial detachment. IRF - intraretinal fluid. SRF - subretinal fluid. EZ - ellipsoid zone. ERM - epiretinal membrane. ORA - outer retinal atrophy. ORT - outer retinal tubulation. 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 Retinal hole of both eyes  H33.323     4. Left retinal detachment  H33.22     5. 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 Pseudophakia  Z96.1     7. Myopia of both eyes  H52.13      1. Retinal edema, myopic retinoschisis OD  - OCT shows nasal IRF/cystic  macular changes extending from inf temp disc -- slightly improved  - FA (05.18.20) without hyperfluorescence/leakage suggesting not CME or optic pit -- myopic retinoschisis   - repeat FA (02.19.24) shows OD: Staining of peripheral laser, no peripapillary leakage / hyper fluorescence  - BCVA OD 20/20  - no retinal or ophthalmic interventions indicated or recommended  - f/u 9 months, sooner prn - DFE, OCT  2-4. Lattice degeneration w/ atrophic holes, OU; OS with focal SRF/RD  - OD: atrophic hole at 1030, mild lattice with small hole at 1200  - OS: pigmented lattice at 0100 and 0430-0500--atrophic hole and +SRF at 0500  - s/p laser retinopexy OS (05.11.20), fill-in (06.05.20) -- good laser changes surrounding all lesions  - s/p laser retinopexy OD (05.18.20), fill-in (06.05.20) -- good laser changes surrounding all lesions   - no new RT/RD or lattice  - monitor  5. Mixed form age related cataract OS - The symptoms of cataract, surgical options, and treatments and risks were discussed with patient. - discussed diagnosis and progression - under the expert management of Dr. Burgess Estelle - monitor  6. Pseudophakia OD  - s/p CE/IOL OD (Dr. Burgess Estelle, Feb 2023)  - IOL in good position, doing well  - monitor  7. Myopia OU  - discussed association of myopia with lattice degeneration and RT/RD and retinoschisis  - monitor  Ophthalmic Meds Ordered this visit:  No orders of the defined types were  placed in this encounter.    This document serves as a record of services personally performed by Karie Chimera, MD, PhD. It was created on their behalf by Charlette Caffey, COT an ophthalmic technician. The creation of this record is the provider's dictation and/or activities during the visit.    Electronically signed by:  Charlette Caffey, COT  11/11/23 4:17 PM  This document serves as a record of services personally performed by Karie Chimera, MD, PhD. It was created on their behalf by Annalee Genta, COMT. The creation of this record is the provider's dictation and/or activities during the visit.  Electronically signed by: Annalee Genta, COMT 11/11/23 4:17 PM  Karie Chimera, M.D., Ph.D. Diseases & Surgery of the Retina and Vitreous [facility]  I have reviewed the above documentation for accuracy and completeness, and I agree with the above. Karie Chimera, M.D., Ph.D. 11/11/23 4:18 PM   Abbreviations: M myopia (nearsighted); A astigmatism; H hyperopia (farsighted); P presbyopia; Mrx spectacle prescription;  CTL contact lenses; OD right eye; OS left eye; OU both eyes  XT exotropia; ET esotropia; PEK punctate epithelial keratitis; PEE punctate epithelial erosions; DES dry eye syndrome; MGD meibomian gland dysfunction; ATs artificial tears; PFAT's preservative free artificial tears; NSC nuclear sclerotic cataract; PSC posterior subcapsular cataract; ERM epi-retinal membrane; PVD posterior vitreous detachment; RD retinal detachment; DM diabetes mellitus; DR diabetic retinopathy; NPDR non-proliferative diabetic retinopathy; PDR proliferative diabetic retinopathy; CSME clinically significant macular edema; DME diabetic macular edema; dbh dot blot hemorrhages; CWS cotton wool spot; POAG primary open angle glaucoma; C/D cup-to-disc ratio; HVF humphrey visual field; GVF goldmann visual field; OCT optical coherence tomography; IOP intraocular pressure; BRVO Branch retinal vein occlusion; CRVO central retinal vein occlusion; CRAO central retinal artery occlusion; BRAO branch retinal artery occlusion; RT retinal tear; SB scleral buckle; PPV pars plana vitrectomy; VH Vitreous hemorrhage; PRP panretinal laser photocoagulation; IVK intravitreal kenalog; VMT vitreomacular traction; MH Macular hole;  NVD neovascularization of the disc; NVE neovascularization elsewhere; AREDS age related eye disease study; ARMD age related macular degeneration; POAG primary open angle glaucoma; EBMD  epithelial/anterior basement membrane dystrophy; ACIOL anterior chamber intraocular lens; IOL intraocular lens; PCIOL posterior chamber intraocular lens; Phaco/IOL phacoemulsification with intraocular lens placement; PRK photorefractive keratectomy; LASIK laser assisted in situ keratomileusis; HTN hypertension; DM diabetes mellitus; COPD chronic obstructive pulmonary disease

## 2023-11-11 ENCOUNTER — Encounter (INDEPENDENT_AMBULATORY_CARE_PROVIDER_SITE_OTHER): Payer: Self-pay | Admitting: Ophthalmology

## 2023-11-11 ENCOUNTER — Ambulatory Visit (INDEPENDENT_AMBULATORY_CARE_PROVIDER_SITE_OTHER): Payer: 59 | Admitting: Ophthalmology

## 2023-11-11 DIAGNOSIS — H3322 Serous retinal detachment, left eye: Secondary | ICD-10-CM

## 2023-11-11 DIAGNOSIS — H33101 Unspecified retinoschisis, right eye: Secondary | ICD-10-CM

## 2023-11-11 DIAGNOSIS — H33323 Round hole, bilateral: Secondary | ICD-10-CM | POA: Diagnosis not present

## 2023-11-11 DIAGNOSIS — Z961 Presence of intraocular lens: Secondary | ICD-10-CM

## 2023-11-11 DIAGNOSIS — H25812 Combined forms of age-related cataract, left eye: Secondary | ICD-10-CM | POA: Diagnosis not present

## 2023-11-11 DIAGNOSIS — H35413 Lattice degeneration of retina, bilateral: Secondary | ICD-10-CM

## 2023-11-11 DIAGNOSIS — H5213 Myopia, bilateral: Secondary | ICD-10-CM

## 2023-12-25 ENCOUNTER — Other Ambulatory Visit: Payer: Self-pay | Admitting: Obstetrics and Gynecology

## 2023-12-25 DIAGNOSIS — Z1239 Encounter for other screening for malignant neoplasm of breast: Secondary | ICD-10-CM

## 2023-12-29 ENCOUNTER — Other Ambulatory Visit: Payer: Self-pay

## 2023-12-29 MED ORDER — EZETIMIBE 10 MG PO TABS: 10.0000 mg | ORAL_TABLET | Freq: Every day | ORAL | 3 refills | Status: DC

## 2024-01-05 ENCOUNTER — Encounter: Payer: Self-pay | Admitting: Internal Medicine

## 2024-01-06 ENCOUNTER — Telehealth (INDEPENDENT_AMBULATORY_CARE_PROVIDER_SITE_OTHER): Payer: Self-pay

## 2024-01-08 ENCOUNTER — Other Ambulatory Visit: Payer: Self-pay

## 2024-01-08 DIAGNOSIS — M85851 Other specified disorders of bone density and structure, right thigh: Secondary | ICD-10-CM

## 2024-01-08 DIAGNOSIS — Z13820 Encounter for screening for osteoporosis: Secondary | ICD-10-CM

## 2024-01-12 ENCOUNTER — Other Ambulatory Visit: Payer: Self-pay | Admitting: Internal Medicine

## 2024-01-23 ENCOUNTER — Other Ambulatory Visit: Payer: Self-pay | Admitting: Internal Medicine

## 2024-01-23 DIAGNOSIS — Z1231 Encounter for screening mammogram for malignant neoplasm of breast: Secondary | ICD-10-CM

## 2024-02-17 LAB — HM DEXA SCAN

## 2024-02-18 ENCOUNTER — Encounter: Payer: Self-pay | Admitting: Internal Medicine

## 2024-02-21 ENCOUNTER — Encounter: Payer: Self-pay | Admitting: Internal Medicine

## 2024-02-24 ENCOUNTER — Ambulatory Visit: Payer: Self-pay

## 2024-02-24 NOTE — Telephone Encounter (Signed)
  Chief Complaint: URI s/s Symptoms: ear pain, cough,  Frequency: started about 4 days ago Pertinent Negatives: Patient denies fever, SOB, CP Disposition: [] ED /[] Urgent Care (no appt availability in office) / [x] Appointment(In office/virtual)/ []  [facility]/ [] Home Care/ [x] Refused Recommended Disposition /[] [facility]/ []  Follow-up with PCP Additional Notes: Pt states [facility]. Pt states that she tested negative. Pt states that she was given "pearls and mucinex and amoxicillin ".  Pt would like a different abx and a different cough medicine. Pt refused scheduling.  Copied from CRM [id number]. Topic: Clinical - Medical Advice >> Feb 24, 2024  9:39 AM Alcus Dad wrote: Reason for CRM: Patient has been having a bad cough. Has been taking amoxicillin with benzonatate and patient hasn't gotten any better. Needs advice or other medication Reason for Disposition  Cough  Answer Assessment - Initial Assessment Questions 1. ONSET: "When did the cough begin?"      4 days ago 2. SEVERITY: "How bad is the cough today?"      Per pt "severe", pt not coughing during calling 3. SPUTUM: "Describe the color of your sputum" (none, dry cough; clear, white, yellow, green)     "Not green" 4. HEMOPTYSIS: "Are you coughing up any blood?" If so ask: "How much?" (flecks, streaks, tablespoons, etc.)     denies 5. DIFFICULTY BREATHING: "Are you having difficulty breathing?" If Yes, ask: "How bad is it?" (e.g., mild, moderate, severe)    - MILD: No SOB at rest, mild SOB with walking, speaks normally in sentences, can lie down, no retractions, pulse < 100.    - MODERATE: SOB at rest, SOB with minimal exertion and prefers to sit, cannot lie down flat, speaks in phrases, mild retractions, audible wheezing, pulse 100-120.    - SEVERE: Very SOB at rest, speaks in single words, struggling to breathe, sitting hunched forward, retractions, pulse > 120       denies 6. FEVER: "Do you have a fever?" If Yes, ask: "What is your temperature, how was it measured, and when did it start?"     denies 7. CARDIAC HISTORY: "Do you have any history of heart disease?" (e.g., heart attack, congestive heart failure)      denies 8. LUNG HISTORY: "Do you have any history of lung disease?"  (e.g., pulmonary embolus, asthma, emphysema)     denies 9. PE RISK FACTORS: "Do you have a history of blood clots?" (or: recent major surgery, recent prolonged travel, bedridden)     denies 10. OTHER SYMPTOMS: "Do you have any other symptoms?" (e.g., runny nose, wheezing, chest pain)       denies 12. TRAVEL: "Have you traveled out of the country in the last month?" (e.g., travel history, exposures)       denies  Protocols used: Cough - Acute Productive-A-AH

## 2024-02-25 NOTE — Telephone Encounter (Signed)
 Spoke with patient today.  She is feeling much better and will call for an appointment if she needs to be seen.

## 2024-03-24 ENCOUNTER — Ambulatory Visit: Payer: 59

## 2024-04-05 ENCOUNTER — Other Ambulatory Visit: Payer: Self-pay | Admitting: Internal Medicine

## 2024-04-07 ENCOUNTER — Encounter: Payer: Self-pay | Admitting: Internal Medicine

## 2024-04-07 MED ORDER — ESZOPICLONE 2 MG PO TABS: ORAL_TABLET | ORAL | 0 refills | Status: AC

## 2024-05-05 ENCOUNTER — Ambulatory Visit
Admission: RE | Admit: 2024-05-05 | Discharge: 2024-05-05 | Disposition: A | Discharge status: Home / Self Care | Source: Ambulatory Visit | Attending: Internal Medicine | Admitting: Internal Medicine

## 2024-05-05 DIAGNOSIS — Z1231 Encounter for screening mammogram for malignant neoplasm of breast: Secondary | ICD-10-CM

## 2024-05-17 ENCOUNTER — Other Ambulatory Visit: Payer: Self-pay

## 2024-05-17 ENCOUNTER — Telehealth: Payer: Self-pay | Admitting: Internal Medicine

## 2024-05-17 DIAGNOSIS — E782 Mixed hyperlipidemia: Secondary | ICD-10-CM

## 2024-05-17 DIAGNOSIS — R7303 Prediabetes: Secondary | ICD-10-CM

## 2024-05-17 DIAGNOSIS — E039 Hypothyroidism, unspecified: Secondary | ICD-10-CM

## 2024-05-17 NOTE — Telephone Encounter (Signed)
 Copied from CRM [id number]. Topic: Clinical - Request for Lab/Test Order >> May 17, 2024  9:33 AM Laymon HERO wrote: Reason for CRM: Patient wanting to know if an order for labs can be put in for tomorrow before her physical on Wednesday, also wanting to know if it needs to be a fasting lab, please reach out as soon as possible

## 2024-05-17 NOTE — Telephone Encounter (Signed)
 Spoke with patient today and lab orders placed.

## 2024-05-18 ENCOUNTER — Encounter: Payer: Self-pay | Admitting: Internal Medicine

## 2024-05-18 ENCOUNTER — Other Ambulatory Visit (INDEPENDENT_AMBULATORY_CARE_PROVIDER_SITE_OTHER)

## 2024-05-18 DIAGNOSIS — R7303 Prediabetes: Secondary | ICD-10-CM | POA: Diagnosis not present

## 2024-05-18 DIAGNOSIS — E782 Mixed hyperlipidemia: Secondary | ICD-10-CM | POA: Diagnosis not present

## 2024-05-18 DIAGNOSIS — E039 Hypothyroidism, unspecified: Secondary | ICD-10-CM

## 2024-05-18 LAB — COMPREHENSIVE METABOLIC PANEL WITH GFR
ALT: 25 U/L (ref 0–35)
AST: 24 U/L (ref 0–37)
Albumin: 4.5 g/dL (ref 3.5–5.2)
Alkaline Phosphatase: 31 U/L — ABNORMAL LOW (ref 39–117)
BUN: 17 mg/dL (ref 6–23)
CO2: 31 meq/L (ref 19–32)
Calcium: 9.5 mg/dL (ref 8.4–10.5)
Chloride: 103 meq/L (ref 96–112)
Creatinine, Ser: 0.68 mg/dL (ref 0.40–1.20)
GFR: 92.59 mL/min (ref >60.00)
Glucose, Bld: 104 mg/dL — ABNORMAL HIGH (ref 70–99)
Potassium: 4.1 meq/L (ref 3.5–5.1)
Sodium: 138 meq/L (ref 135–145)
Total Bilirubin: 0.4 mg/dL (ref 0.2–1.2)
Total Protein: 6.9 g/dL (ref 6.0–8.3)

## 2024-05-18 LAB — LIPID PANEL
Cholesterol: 165 mg/dL (ref 0–200)
HDL: 68.1 mg/dL (ref >39.00)
LDL Cholesterol: 80 mg/dL (ref 0–99)
NonHDL: 97.34
Total CHOL/HDL Ratio: 2
Triglycerides: 86 mg/dL (ref 0.0–149.0)
VLDL: 17.2 mg/dL (ref 0.0–40.0)

## 2024-05-18 LAB — CBC WITH DIFFERENTIAL/PLATELET
Basophils Absolute: 0 10*3/uL (ref 0.0–0.1)
Basophils Relative: 0.5 % (ref 0.0–3.0)
Eosinophils Absolute: 0.1 10*3/uL (ref 0.0–0.7)
Eosinophils Relative: 4.3 % (ref 0.0–5.0)
HCT: 38.9 % (ref 36.0–46.0)
Hemoglobin: 13.1 g/dL (ref 12.0–15.0)
Lymphocytes Relative: 35.5 % (ref 12.0–46.0)
Lymphs Abs: 1.1 10*3/uL (ref 0.7–4.0)
MCHC: 33.7 g/dL (ref 30.0–36.0)
MCV: 91.9 fl (ref 78.0–100.0)
Monocytes Absolute: 0.3 10*3/uL (ref 0.1–1.0)
Monocytes Relative: 9.6 % (ref 3.0–12.0)
Neutro Abs: 1.5 10*3/uL (ref 1.4–7.7)
Neutrophils Relative %: 50.1 % (ref 43.0–77.0)
Platelets: 334 10*3/uL (ref 150.0–400.0)
RBC: 4.24 Mil/uL (ref 3.87–5.11)
RDW: 13.8 % (ref 11.5–15.5)
WBC: 3.1 10*3/uL — ABNORMAL LOW (ref 4.0–10.5)

## 2024-05-18 LAB — TSH: TSH: 1.22 u[IU]/mL (ref 0.35–5.50)

## 2024-05-18 LAB — HEMOGLOBIN A1C: Hgb A1c MFr Bld: 6.1 % (ref 4.6–6.5)

## 2024-05-18 NOTE — Progress Notes (Unsigned)
 Subjective:    Patient ID: Jeanne Parker, female    DOB: [birth year], 63 y.o.   MRN: [id number]      HPI Jeanne Parker is here for a Physical exam and her chronic medical problems.   Doing well.  No concerns.     Medications and allergies reviewed with patient and updated if appropriate.  Current Outpatient Medications on File Prior to Visit  Medication Sig Dispense Refill   citalopram  (CELEXA ) 10 MG tablet Take 1 tablet (10 mg total) by mouth daily. 90 tablet 3   Coenzyme Q10 (COQ-10 PO) Take by mouth.     cycloSPORINE  (RESTASIS ) 0.05 % ophthalmic emulsion Place into both eyes 2 (two) times daily.      eszopiclone  (LUNESTA ) 2 MG TABS tablet TAKE 1 TABLET BY MOUTH AT  BEDTIME AS NEEDED FOR SLEEP IMMEDIATELY BEFORE BEDTIME. 90 tablet 0   Evolocumab  (REPATHA  SURECLICK) 140 MG/ML SOAJ Inject 140 mg into the skin every 14 (fourteen) days. 6 mL 3   ezetimibe  (ZETIA ) 10 MG tablet Take 1 tablet (10 mg total) by mouth daily. 90 tablet 3   levothyroxine  (SYNTHROID ) 75 MCG tablet TAKE 1 TABLET BY MOUTH DAILY  BEFORE BREAKFAST 90 tablet 3   Multiple Vitamin (MULTIVITAMIN) capsule Take 1 capsule by mouth daily.     timolol  (TIMOPTIC ) 0.5 % ophthalmic solution 1 drop 2 (two) times daily.     tretinoin  (RETIN-A ) 0.025 % cream Apply topically at bedtime. 45 g 0   No current facility-administered medications on file prior to visit.    Review of Systems  Constitutional:  Negative for fever.  HENT:  Positive for tinnitus. Negative for hearing loss.   Eyes:  Negative for visual disturbance.  Respiratory:  Negative for cough, shortness of breath and wheezing.   Cardiovascular:  Negative for chest pain, palpitations and leg swelling.  Gastrointestinal:  Negative for abdominal pain, blood in stool, constipation and diarrhea.       No gerd  Genitourinary:  Negative for dysuria.  Musculoskeletal:  Negative for arthralgias and back pain.  Skin:  Positive for rash (itchy rash on left forehead and  dryness on left upper eyelid).  Neurological:  Negative for light-headedness and headaches.  Psychiatric/Behavioral:  Negative for dysphoric mood. The patient is not nervous/anxious.        Objective:   Vitals:   05/19/24 0850  BP: 110/70  Pulse: 87  Temp: 97.8 F (36.6 C)  SpO2: 99%   Filed Weights   05/19/24 0850  Weight: 132 lb (59.9 kg)   Body mass index is 21.97 kg/m.  BP Readings from Last 3 Encounters:  05/19/24 110/70  10/10/23 126/78  06/04/23 106/74    Wt Readings from Last 3 Encounters:  05/19/24 132 lb (59.9 kg)  10/10/23 135 lb 9.6 oz (61.5 kg)  06/04/23 132 lb (59.9 kg)       Physical Exam Constitutional: She appears well-developed and well-nourished. No distress.  HENT:  Head: Normocephalic and atraumatic.  Right Ear: External ear normal. Normal ear canal and TM Left Ear: External ear normal.  Normal ear canal and TM Mouth/Throat: Oropharynx is clear and moist.  Eyes: Conjunctivae normal.  Neck: Neck supple. No tracheal deviation present. No thyromegaly present.  No carotid bruit  Cardiovascular: Normal rate, regular rhythm and normal heart sounds.   No murmur heard.  No edema. Pulmonary/Chest: Effort normal and breath sounds normal. No respiratory distress. She has no wheezes. She has no rales.  Breast: deferred  Abdominal: Soft. She exhibits no distension. There is no tenderness.  Lymphadenopathy: She has no cervical adenopathy.  Skin: Skin is warm and dry. She is not diaphoretic.  Psychiatric: She has a normal mood and affect. Her behavior is normal.     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   Health Maintenance  Topic Date Due   Cervical Cancer Screening (HPV/Pap Cotest)  03/29/2022   COVID-19 Vaccine (5 - 2024-25 season) 06/03/2024 (Originally 07/27/2023)   INFLUENZA VACCINE  06/25/2024   DTaP/Tdap/Td (2 - Td or Tdap) 06/29/2024   Colonoscopy  09/18/2025   DEXA SCAN  02/16/2026   MAMMOGRAM  05/05/2026   Hepatitis C Screening  Completed   HIV Screening  Completed   Zoster Vaccines- Shingrix   Completed   Pneumococcal Vaccine 78-28 Years old  Aged Out   Hepatitis B Vaccines  Aged Out   HPV VACCINES  Aged Out   Meningococcal B Vaccine  Aged Out          See Problem List for Assessment and Plan of chronic medical problems.

## 2024-05-18 NOTE — Patient Instructions (Addendum)
 Medications changes include :   None    A referral was ordered and someone will call you to schedule an appointment.     Return in about 1 year (around 05/19/2025) for Physical Exam.   Health Maintenance, Female Adopting a healthy lifestyle and getting preventive care are important in promoting health and wellness. Ask your health care provider about: The right schedule for you to have regular tests and exams. Things you can do on your own to prevent diseases and keep yourself healthy. What should I know about diet, weight, and exercise? Eat a healthy diet  Eat a diet that includes plenty of vegetables, fruits, low-fat dairy products, and lean protein. Do not eat a lot of foods that are high in solid fats, added sugars, or sodium. Maintain a healthy weight Body mass index (BMI) is used to identify weight problems. It estimates body fat based on height and weight. Your health care provider can help determine your BMI and help you achieve or maintain a healthy weight. Get regular exercise Get regular exercise. This is one of the most important things you can do for your health. Most adults should: Exercise for at least 150 minutes each week. The exercise should increase your heart rate and make you sweat (moderate-intensity exercise). Do strengthening exercises at least twice a week. This is in addition to the moderate-intensity exercise. Spend less time sitting. Even light physical activity can be beneficial. Watch cholesterol and blood lipids Have your blood tested for lipids and cholesterol at 64 years of age, then have this test every 5 years. Have your cholesterol levels checked more often if: Your lipid or cholesterol levels are high. You are older than 64 years of age. You are at high risk for heart disease. What should I know about cancer screening? Depending on your health history and family history, you may need to have cancer screening at various ages. This may  include screening for: Breast cancer. Cervical cancer. Colorectal cancer. Skin cancer. Lung cancer. What should I know about heart disease, diabetes, and high blood pressure? Blood pressure and heart disease High blood pressure causes heart disease and increases the risk of stroke. This is more likely to develop in people who have high blood pressure readings or are overweight. Have your blood pressure checked: Every 3-5 years if you are 87-51 years of age. Every year if you are 32 years old or older. Diabetes Have regular diabetes screenings. This checks your fasting blood sugar level. Have the screening done: Once every three years after age 83 if you are at a normal weight and have a low risk for diabetes. More often and at a younger age if you are overweight or have a high risk for diabetes. What should I know about preventing infection? Hepatitis B If you have a higher risk for hepatitis B, you should be screened for this virus. Talk with your health care provider to find out if you are at risk for hepatitis B infection. Hepatitis C Testing is recommended for: Everyone born from [birth year] through 1966. Anyone with known risk factors for hepatitis C. Sexually transmitted infections (STIs) Get screened for STIs, including gonorrhea and chlamydia, if: You are sexually active and are younger than 64 years of age. You are older than 64 years of age and your health care provider tells you that you are at risk for this type of infection. Your sexual activity has changed since you were last screened, and you  are at increased risk for chlamydia or gonorrhea. Ask your health care provider if you are at risk. Ask your health care provider about whether you are at high risk for HIV. Your health care provider may recommend a prescription medicine to help prevent HIV infection. If you choose to take medicine to prevent HIV, you should first get tested for HIV. You should then be tested every 3 months  for as long as you are taking the medicine. Pregnancy If you are about to stop having your period (premenopausal) and you may become pregnant, seek counseling before you get pregnant. Take 400 to 800 micrograms (mcg) of folic acid every day if you become pregnant. Ask for birth control (contraception) if you want to prevent pregnancy. Osteoporosis and menopause Osteoporosis is a disease in which the bones lose minerals and strength with aging. This can result in bone fractures. If you are 3 years old or older, or if you are at risk for osteoporosis and fractures, ask your health care provider if you should: Be screened for bone loss. Take a calcium  or vitamin D  supplement to lower your risk of fractures. Be given hormone replacement therapy (HRT) to treat symptoms of menopause. Follow these instructions at home: Alcohol use Do not drink alcohol if: Your health care provider tells you not to drink. You are pregnant, may be pregnant, or are planning to become pregnant. If you drink alcohol: Limit how much you have to: 0-1 drink a day. Know how much alcohol is in your drink. In the [place] one drink equals one 12 oz bottle of beer (355 mL), one 5 oz glass of wine (148 mL), or one 1 oz glass of hard liquor (44 mL). Lifestyle Do not use any products that contain nicotine or tobacco. These products include cigarettes, chewing tobacco, and vaping devices, such as e-cigarettes. If you need help quitting, ask your health care provider. Do not use street drugs. Do not share needles. Ask your health care provider for help if you need support or information about quitting drugs. General instructions Schedule regular health, dental, and eye exams. Stay current with your vaccines. Tell your health care provider if: You often feel depressed. You have ever been abused or do not feel safe at home. Summary Adopting a healthy lifestyle and getting preventive care are important in promoting health and  wellness. Follow your health care provider's instructions about healthy diet, exercising, and getting tested or screened for diseases. Follow your health care provider's instructions on monitoring your cholesterol and blood pressure. This information is not intended to replace advice given to you by your health care provider. Make sure you discuss any questions you have with your health care provider. Document Revised: 04/02/2021 Document Reviewed: 04/02/2021 Elsevier Patient Education  2024 [employer].

## 2024-05-19 ENCOUNTER — Ambulatory Visit: Payer: 59 | Admitting: Internal Medicine

## 2024-05-19 ENCOUNTER — Encounter: Payer: Self-pay | Admitting: Internal Medicine

## 2024-05-19 VITALS — BP 110/70 | HR 87 | Temp 97.8°F | Ht 65.0 in | Wt 132.0 lb

## 2024-05-19 DIAGNOSIS — G479 Sleep disorder, unspecified: Secondary | ICD-10-CM

## 2024-05-19 DIAGNOSIS — E039 Hypothyroidism, unspecified: Secondary | ICD-10-CM

## 2024-05-19 DIAGNOSIS — R7303 Prediabetes: Secondary | ICD-10-CM

## 2024-05-19 DIAGNOSIS — Z0000 Encounter for general adult medical examination without abnormal findings: Secondary | ICD-10-CM | POA: Diagnosis not present

## 2024-05-19 DIAGNOSIS — M85851 Other specified disorders of bone density and structure, right thigh: Secondary | ICD-10-CM

## 2024-05-19 DIAGNOSIS — E782 Mixed hyperlipidemia: Secondary | ICD-10-CM

## 2024-05-19 DIAGNOSIS — F411 Generalized anxiety disorder: Secondary | ICD-10-CM

## 2024-05-19 DIAGNOSIS — E559 Vitamin D deficiency, unspecified: Secondary | ICD-10-CM

## 2024-05-19 NOTE — Assessment & Plan Note (Signed)
 Chronic Continue vitamin D  supplementation  Last vitamin D  Lab Results  Component Value Date   VD25OH 47.99 04/24/2022

## 2024-05-19 NOTE — Assessment & Plan Note (Signed)
Chronic Controlled, stable Continue celexa 10 mg daily  

## 2024-05-19 NOTE — Assessment & Plan Note (Signed)
 Chronic  Clinically euthyroid Lab Results  Component Value Date   TSH 1.22 05/18/2024   Currently taking levothyroxine  75 mcg TSH in normal range Continue current dose

## 2024-05-19 NOTE — Assessment & Plan Note (Signed)
 Chronic Lab Results  Component Value Date   LDLCALC 80 05/18/2024   Regular exercise and healthy diet encouraged LDL is good Coronary calcium  score 0 in 2020 ASCVD risk low Continue zetia  10 mg daily, repatha  140 mg Q 14 days

## 2024-05-19 NOTE — Assessment & Plan Note (Addendum)
 Chronic Lab Results  Component Value Date   HGBA1C 6.1 05/18/2024   A1c slightly higher-may have been eating more sugar in the past couple of months and stress level has been higher Low sugar/carb diet Continue regular exercise

## 2024-05-19 NOTE — Assessment & Plan Note (Signed)
Chronic dexa up to date Continue calcium and vitamin supplementation Continue regular exercise 

## 2024-05-19 NOTE — Assessment & Plan Note (Signed)
Chronic Controlled, stable Continue lunesta 2 mg HSprn  

## 2024-06-25 ENCOUNTER — Other Ambulatory Visit: Payer: Self-pay | Admitting: Internal Medicine

## 2024-08-11 ENCOUNTER — Encounter (INDEPENDENT_AMBULATORY_CARE_PROVIDER_SITE_OTHER): Payer: 59 | Admitting: Ophthalmology

## 2024-08-22 ENCOUNTER — Other Ambulatory Visit: Payer: Self-pay | Admitting: Internal Medicine

## 2024-08-23 NOTE — Progress Notes (Signed)
 [facility] - Clinic Note  08/30/2024     CHIEF COMPLAINT Patient presents for Retina Follow Up   HISTORY OF PRESENT ILLNESS: Jeanne Parker is a 63 y.o. female who presents to the clinic today for:   HPI     Retina Follow Up   In right eye.  This started 10 months ago.  Duration of 10 months.  Since onset it is stable.  I, the attending physician,  performed the HPI with the patient and updated documentation appropriately.        Comments   10 month retina follow up schisis OD pt is reporting no vision changes noticed she denies any flashes or floaters she is using timolol  bid ou pt has SLT OU few weeks ago at [facility] by Valdemar Rogue, MD on 08/30/2024  9:31 PM.    Pt states she has been seen @ [facility] for glaucoma. Pt had the laser procedure OU. Pt has had blurry VA in OD, told she needs a YAG and will have that done when she follows up.    Referring physician: Geofm Glade PARAS, MD [address] [place],  [place] [address]  HISTORICAL INFORMATION:   Selected notes from the MEDICAL RECORD NUMBER Referred by Dr. Glendia Gaudy for concern of iritis, operculated hole LEE: 05.11.20 (S. Groat)   Ocular Hx- PMH-anxiety, breast cancer, hypothyroid   CURRENT MEDICATIONS: Current Outpatient Medications (Ophthalmic Drugs)  Medication Sig   cycloSPORINE  (RESTASIS ) 0.05 % ophthalmic emulsion Place into both eyes 2 (two) times daily.    timolol  (TIMOPTIC ) 0.5 % ophthalmic solution 1 drop 2 (two) times daily.   No current facility-administered medications for this visit. (Ophthalmic Drugs)   Current Outpatient Medications (Other)  Medication Sig   citalopram  (CELEXA ) 10 MG tablet TAKE 1 TABLET(10 MG) BY MOUTH DAILY   Coenzyme Q10 (COQ-10 PO) Take by mouth.   eszopiclone  (LUNESTA ) 2 MG TABS tablet TAKE 1 TABLET BY MOUTH AT  BEDTIME AS NEEDED FOR SLEEP IMMEDIATELY BEFORE BEDTIME.   Evolocumab  (REPATHA  SURECLICK) 140 MG/ML SOAJ Inject 140 mg into the  skin every 14 (fourteen) days.   ezetimibe  (ZETIA ) 10 MG tablet Take 1 tablet (10 mg total) by mouth daily.   levothyroxine  (SYNTHROID ) 75 MCG tablet TAKE 1 TABLET BY MOUTH DAILY  BEFORE BREAKFAST   Multiple Vitamin (MULTIVITAMIN) capsule Take 1 capsule by mouth daily.   tretinoin  (RETIN-A ) 0.025 % cream Apply topically at bedtime.   No current facility-administered medications for this visit. (Other)   REVIEW OF SYSTEMS: ROS   Positive for: Endocrine, Eyes, Psychiatric Negative for: Constitutional, Gastrointestinal, Neurological, Skin, Genitourinary, Musculoskeletal, HENT, Cardiovascular, Respiratory, Allergic/Imm, Heme/Lymph Last edited by Resa Delon ORN, COT on 08/30/2024  2:49 PM.     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;  Service: General;  Laterality: Right;   FAMILY HISTORY Family History  Problem Relation Age of Onset   Hypertension Mother 24   Hyperlipidemia Mother    Thyroid  nodules Mother    Polycythemia Mother    Diabetes Father 26   Hyperlipidemia Father    Hypertension Father    GER disease Father    Coronary artery disease Father        angioplasty age 23s   Atrial fibrillation Father        on coumadin   Thyroid  cancer Sister     Breast cancer Sister    Gout Brother    Colon cancer Paternal Grandfather        died age 33   Breast cancer Cousin    SOCIAL HISTORY Social History   Tobacco Use   Smoking status: Never   Smokeless tobacco: Never  Vaping Use   Vaping status: Never Used  Substance Use Topics   Alcohol use: Yes    Comment: 1 to 2 glasses of wine a week    Drug use: No       Base Eye Exam     Visual Acuity (Snellen - Linear)       Right Left   Dist [place] 20/25    Dist cc  20/60 -2   Dist ph [place] NI    Dist ph cc  20/20         Visual Acuity Comments   D OD N OS         Tonometry (Tonopen, 2:55 PM)       Right Left   Pressure 13 17         Pupils       Pupils Dark Light Shape React APD   Right PERRL 5 4 Round Brisk None   Left PERRL 5 4 Round Brisk None         Visual Fields       Left Right    Full Full         Extraocular Movement       Right Left    Full, Ortho Full, Ortho         Neuro/Psych     Oriented x3: Yes   Mood/Affect: Normal         Dilation     Both eyes: 2.5% Phenylephrine @ 2:55 PM           Slit Lamp and Fundus Exam     Slit Lamp Exam       Right Left   Lids/Lashes Normal Normal   Conjunctiva/Sclera White and quiet White and quiet   Cornea mild arcus, well healed cataract wound, trace PEE mild Arcus, trace Punctate epithelial erosions   Anterior Chamber deep and clear deep and clear   Iris Round and well dilated Round and well dilated   Lens PC IOL in good position, 1+ Posterior subcapsular cataract 2+ Nuclear sclerosis, 2+ Cortical cataract   Anterior Vitreous Vitreous syneresis, Posterior vitreous detachment, vitreous condensations mild syneresis         Fundus Exam       Right Left   Disc Pink and Sharp, mild tilt, Peripapillary atrophy, Compact Tilted disc, temporal Peripapillary atrophy, Pink and Sharp, Compact   C/D Ratio 0.4 0.6   Macula Flat, blunted  foveal reflex, trace ERM, mild Retinal pigment  epithelial mottling, shallow cystic changes / retinoschisis nasal macula -- slightly improved Flat, blunted foveal reflex, mild Retinal pigment epithelial mottling, No heme or edema   Vessels attenuated, mild tortuosity mild attenuation, mild tortuosity   Periphery Attached, superior lattice w/ atrophic holes at 1030 and 1200 w/+SRF -- good laser surrounding; 3 focal patches of pigmented lattice with RPE atrophy from 0600 to 0700, no new RT/RD/lattice Attached, pigmented lattice at 0100 and from 0430-0600; atrophic hole w/ mild cuff of SRF at 0500 -- good laser surrounding all lesions, No new RT/RD/lattice           Refraction     Wearing Rx       Sphere   Right -   Left            IMAGING AND PROCEDURES  Imaging and Procedures for @TODAY @  OCT, Retina - OU - Both Eyes       Right Eye Quality was good. Central Foveal Thickness: 255. Progression has improved. Findings include normal foveal contour, no SRF, myopic contour, intraretinal fluid (Peripapillary cystic changes nasal macula and inferior to disc -- improved).   Left Eye Quality was good. Central Foveal Thickness: 259. Progression has been stable. Findings include normal foveal contour, no IRF, no SRF, myopic contour (Partial PVD).   Notes *Images captured and stored on drive  Diagnosis / Impression:  OD: Peripapillary cystic changes nasal macula and inferior to disc -- improved OS: NFP, no IRF/SRF, partial PVD  Clinical management:  See below  Abbreviations: NFP - Normal foveal profile. CME - cystoid macular edema. PED - pigment epithelial detachment. IRF - intraretinal fluid. SRF - subretinal fluid. EZ - ellipsoid zone. ERM - epiretinal membrane. ORA - outer retinal atrophy. ORT - outer retinal tubulation. 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 Retinal hole of both eyes  H33.323     4. Left retinal detachment  H33.22     5. 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 Pseudophakia  Z96.1     7. Myopia of both eyes  H52.13      1. Retinal edema, myopic retinoschisis OD  - OCT shows nasal IRF/cystic macular changes extending from inf temp disc -- slightly improved  - FA (05.18.20) without hyperfluorescence/leakage suggesting not CME or optic pit -- myopic retinoschisis   - repeat FA (02.19.24) shows OD: Staining of peripheral laser, no peripapillary leakage / hyper fluorescence  - BCVA OD 20/20  - no retinal or ophthalmic interventions indicated or recommended  - f/u 9 months, sooner prn - DFE, OCT  2-4. Lattice degeneration w/ atrophic holes, OU; OS with focal SRF/RD  - OD: atrophic hole at 1030, mild lattice with small hole at 1200  - OS: pigmented lattice at 0100 and 0430-0500--atrophic hole and +SRF at 0500  - s/p laser retinopexy OS (05.11.20), fill-in (06.05.20) -- good laser changes surrounding all lesions  - s/p laser retinopexy OD (05.18.20), fill-in (06.05.20) -- good laser changes surrounding all lesions   - no new RT/RD or lattice  - monitor  5. Mixed form age related cataract OS - The symptoms of cataract, surgical options, and treatments and risks  were discussed with patient. - discussed diagnosis and progression - under the expert management of Dr. Patrcia - monitor  6. Pseudophakia OD  - s/p CE/IOL OD (Dr. Patrcia, Feb 2023)  - IOL in good position, doing well  - monitor  7. Myopia OU  - discussed association of myopia with lattice degeneration and RT/RD and retinoschisis  - monitor  Ophthalmic Meds Ordered this visit:  No orders of the defined types were placed in this encounter.    This document serves as a record of services personally performed by Redell JUDITHANN Hans, MD, PhD. It was created on their behalf by Avelina Pereyra, COA an ophthalmic technician. The  creation of this record is the provider's dictation and/or activities during the visit.   Electronically signed by: Avelina GORMAN Pereyra, COT  08/30/24  9:35 PM   This document serves as a record of services personally performed by Redell JUDITHANN Hans, MD, PhD. It was created on their behalf by Wanda GEANNIE Keens, COT an ophthalmic technician. The creation of this record is the provider's dictation and/or activities during the visit.    Electronically signed by:  Wanda GEANNIE Keens, COT  08/30/24 9:35 PM  Redell JUDITHANN Hans, M.D., Ph.D. Diseases & Surgery of the Retina and Vitreous [facility]  I have reviewed the above documentation for accuracy and completeness, and I agree with the above. Redell JUDITHANN Hans, M.D., Ph.D. 08/30/24 9:38 PM   Abbreviations: M myopia (nearsighted); A astigmatism; H hyperopia (farsighted); P presbyopia; Mrx spectacle prescription;  CTL contact lenses; OD right eye; OS left eye; OU both eyes  XT exotropia; ET esotropia; PEK punctate epithelial keratitis; PEE punctate epithelial erosions; DES dry eye syndrome; MGD meibomian gland dysfunction; ATs artificial tears; PFAT's preservative free artificial tears; NSC nuclear sclerotic cataract; PSC posterior subcapsular cataract; ERM epi-retinal membrane; PVD posterior vitreous detachment; RD retinal detachment; DM diabetes mellitus; DR diabetic retinopathy; NPDR non-proliferative diabetic retinopathy; PDR proliferative diabetic retinopathy; CSME clinically significant macular edema; DME diabetic macular edema; dbh dot blot hemorrhages; CWS cotton wool spot; POAG primary open angle glaucoma; C/D cup-to-disc ratio; HVF humphrey visual field; GVF goldmann visual field; OCT optical coherence tomography; IOP intraocular pressure; BRVO Branch retinal vein occlusion; CRVO central retinal vein occlusion; CRAO central retinal artery occlusion; BRAO branch retinal artery occlusion; RT retinal tear; SB scleral buckle; PPV pars  plana vitrectomy; VH Vitreous hemorrhage; PRP panretinal laser photocoagulation; IVK intravitreal kenalog; VMT vitreomacular traction; MH Macular hole;  NVD neovascularization of the disc; NVE neovascularization elsewhere; AREDS age related eye disease study; ARMD age related macular degeneration; POAG primary open angle glaucoma; EBMD epithelial/anterior basement membrane dystrophy; ACIOL anterior chamber intraocular lens; IOL intraocular lens; PCIOL posterior chamber intraocular lens; Phaco/IOL phacoemulsification with intraocular lens placement; PRK photorefractive keratectomy; LASIK laser assisted in situ keratomileusis; HTN hypertension; DM diabetes mellitus; COPD chronic obstructive pulmonary disease

## 2024-08-30 ENCOUNTER — Ambulatory Visit (INDEPENDENT_AMBULATORY_CARE_PROVIDER_SITE_OTHER): Admitting: Ophthalmology

## 2024-08-30 ENCOUNTER — Encounter (INDEPENDENT_AMBULATORY_CARE_PROVIDER_SITE_OTHER): Payer: Self-pay | Admitting: Ophthalmology

## 2024-08-30 DIAGNOSIS — Z961 Presence of intraocular lens: Secondary | ICD-10-CM

## 2024-08-30 DIAGNOSIS — H33101 Unspecified retinoschisis, right eye: Secondary | ICD-10-CM

## 2024-08-30 DIAGNOSIS — H3322 Serous retinal detachment, left eye: Secondary | ICD-10-CM | POA: Diagnosis not present

## 2024-08-30 DIAGNOSIS — H25812 Combined forms of age-related cataract, left eye: Secondary | ICD-10-CM

## 2024-08-30 DIAGNOSIS — H33323 Round hole, bilateral: Secondary | ICD-10-CM | POA: Diagnosis not present

## 2024-08-30 DIAGNOSIS — H35413 Lattice degeneration of retina, bilateral: Secondary | ICD-10-CM

## 2024-08-30 DIAGNOSIS — H5213 Myopia, bilateral: Secondary | ICD-10-CM

## 2024-09-08 ENCOUNTER — Other Ambulatory Visit

## 2024-09-09 ENCOUNTER — Ambulatory Visit

## 2024-09-14 ENCOUNTER — Ambulatory Visit

## 2024-09-14 DIAGNOSIS — Z23 Encounter for immunization: Secondary | ICD-10-CM | POA: Diagnosis not present

## 2024-09-14 NOTE — Progress Notes (Signed)
 Pt was given Reg Flu vaccine w/o any complications at this time.

## 2024-09-22 ENCOUNTER — Other Ambulatory Visit: Payer: 59

## 2024-10-23 ENCOUNTER — Other Ambulatory Visit: Payer: Self-pay | Admitting: Pharmacist

## 2024-10-29 ENCOUNTER — Telehealth: Payer: Self-pay | Admitting: *Deleted

## 2024-10-29 NOTE — Telephone Encounter (Signed)
 Received call from pt stating her gynecologist has ordered a breast MRI but insurance had denied it and it is currently in the appeal process.  Pt states she would like for MD at our office to order breast MRI in hopes that it would be approved if her oncologist orders.  Pt last seen in office in 2022 and pt educated and office visit is needed for ordering any scans primarily because insurance will need recent office notes attached to authorization request.  RN also educated pt that the breast MRI has been ordered with the reasoning of her hx of breast cancer, and it was still denied, it would be denied if our office ordered it because we would use the same diagnosis code.  Pt states she does not want to come in for an office visit and will continue the process with her gynecologist.

## 2024-12-13 ENCOUNTER — Other Ambulatory Visit: Payer: Self-pay | Admitting: Internal Medicine

## 2024-12-13 ENCOUNTER — Other Ambulatory Visit: Payer: Self-pay | Admitting: Cardiology

## 2024-12-28 ENCOUNTER — Ambulatory Visit (HOSPITAL_BASED_OUTPATIENT_CLINIC_OR_DEPARTMENT_OTHER): Admitting: Cardiology

## 2024-12-28 ENCOUNTER — Other Ambulatory Visit (HOSPITAL_BASED_OUTPATIENT_CLINIC_OR_DEPARTMENT_OTHER): Payer: Self-pay

## 2024-12-28 ENCOUNTER — Other Ambulatory Visit (HOSPITAL_COMMUNITY): Payer: Self-pay

## 2024-12-28 ENCOUNTER — Encounter (HOSPITAL_BASED_OUTPATIENT_CLINIC_OR_DEPARTMENT_OTHER): Payer: Self-pay | Admitting: Cardiology

## 2024-12-28 VITALS — BP 128/82 | HR 59 | Ht 65.0 in | Wt 139.0 lb

## 2024-12-28 DIAGNOSIS — E7841 Elevated Lipoprotein(a): Secondary | ICD-10-CM | POA: Diagnosis not present

## 2024-12-28 DIAGNOSIS — E782 Mixed hyperlipidemia: Secondary | ICD-10-CM | POA: Diagnosis not present

## 2024-12-28 DIAGNOSIS — T466X5D Adverse effect of antihyperlipidemic and antiarteriosclerotic drugs, subsequent encounter: Secondary | ICD-10-CM | POA: Diagnosis not present

## 2024-12-28 DIAGNOSIS — Z7189 Other specified counseling: Secondary | ICD-10-CM | POA: Diagnosis not present

## 2024-12-28 DIAGNOSIS — M791 Myalgia, unspecified site: Secondary | ICD-10-CM

## 2024-12-28 DIAGNOSIS — E78011 Heterozygous familial hypercholesterolemia (hefh): Secondary | ICD-10-CM | POA: Diagnosis not present

## 2024-12-28 MED ORDER — REPATHA SURECLICK 140 MG/ML ~~LOC~~ SOAJ
140.0000 mg | SUBCUTANEOUS | 3 refills | Status: AC
  Filled 2024-12-28: qty 2, 28d supply, fill #0
  Filled 2024-12-28: qty 6, 84d supply, fill #0
  Filled 2024-12-29: qty 2, 28d supply, fill #0
  Filled 2024-12-31 (×2): qty 6, 84d supply, fill #0
  Filled ????-??-??: fill #0

## 2024-12-28 MED ORDER — EZETIMIBE 10 MG PO TABS
10.0000 mg | ORAL_TABLET | Freq: Every day | ORAL | 3 refills | Status: AC
  Filled 2024-12-28: qty 90, 90d supply, fill #0
  Filled 2024-12-28 – 2024-12-29 (×2): qty 30, 30d supply, fill #0

## 2024-12-28 NOTE — Patient Instructions (Signed)
 Medication Instructions:   Your physician recommends that you continue on your current medications as directed. Please refer to the Current Medication list given to you today.   *If you need a refill on your cardiac medications before your next appointment, please call your pharmacy*  Lab Work:  None ordered.  If you have labs (blood work) drawn today and your tests are completely normal, you will receive your results only by: MyChart Message (if you have MyChart) OR A paper copy in the mail If you have any lab test that is abnormal or we need to change your treatment, we will call you to review the results.  Testing/Procedures:  None ordered.  Follow-Up: At [facility], you and your health needs are our priority.  As part of our continuing mission to provide you with exceptional heart care, our providers are all part of one team.  This team includes your primary Cardiologist (physician) and Advanced Practice Providers or APPs (Physician Assistants and Nurse Practitioners) who all work together to provide you with the care you need, when you need it.  Your next appointment:   1 year(s)  Provider:   Sheryle Donning, MD, Slater Duncan, NP, or Neomi Banks, NP    We recommend signing up for the patient portal called "MyChart".  Sign up information is provided on this After Visit Summary.  MyChart is used to connect with patients for Virtual Visits (Telemedicine).  Patients are able to view lab/test results, encounter notes, upcoming appointments, etc.  Non-urgent messages can be sent to your provider as well.   To learn more about what you can do with MyChart, go to [email or web address].   Other Instructions  Your physician wants you to follow-up in: 1 year.  You will receive a reminder letter in the mail two months in advance. If you don't receive a letter, please call our office to schedule the follow-up appointment.

## 2024-12-29 ENCOUNTER — Other Ambulatory Visit: Payer: Self-pay

## 2024-12-29 ENCOUNTER — Other Ambulatory Visit (HOSPITAL_COMMUNITY): Payer: Self-pay

## 2024-12-30 ENCOUNTER — Other Ambulatory Visit: Payer: Self-pay

## 2024-12-31 ENCOUNTER — Other Ambulatory Visit (HOSPITAL_COMMUNITY): Payer: Self-pay

## 2024-12-31 ENCOUNTER — Ambulatory Visit (HOSPITAL_BASED_OUTPATIENT_CLINIC_OR_DEPARTMENT_OTHER): Admitting: Cardiology

## 2025-05-31 ENCOUNTER — Encounter (INDEPENDENT_AMBULATORY_CARE_PROVIDER_SITE_OTHER): Admitting: Ophthalmology
